# Patient Record
Sex: Male | Born: 1965 | Race: White | Hispanic: No | Marital: Married | Smoking: Former smoker
Health system: Southern US, Community
[De-identification: ages and names within clinical notes are randomized; demographics above are authoritative.]

## PROBLEM LIST (undated history)

## (undated) DIAGNOSIS — F1010 Alcohol abuse, uncomplicated: Secondary | ICD-10-CM

## (undated) DIAGNOSIS — M1990 Unspecified osteoarthritis, unspecified site: Secondary | ICD-10-CM

## (undated) DIAGNOSIS — E785 Hyperlipidemia, unspecified: Secondary | ICD-10-CM

## (undated) DIAGNOSIS — I10 Essential (primary) hypertension: Secondary | ICD-10-CM

## (undated) DIAGNOSIS — IMO0002 Reserved for concepts with insufficient information to code with codable children: Secondary | ICD-10-CM

## (undated) DIAGNOSIS — I2510 Atherosclerotic heart disease of native coronary artery without angina pectoris: Secondary | ICD-10-CM

## (undated) DIAGNOSIS — I219 Acute myocardial infarction, unspecified: Secondary | ICD-10-CM

## (undated) DIAGNOSIS — F329 Major depressive disorder, single episode, unspecified: Secondary | ICD-10-CM

## (undated) DIAGNOSIS — F32A Depression, unspecified: Secondary | ICD-10-CM

## (undated) HISTORY — DX: Hyperlipidemia, unspecified: E78.5

## (undated) HISTORY — PX: CARDIAC CATHETERIZATION: SHX172

## (undated) HISTORY — PX: TONSILLECTOMY: SUR1361

## (undated) HISTORY — DX: Major depressive disorder, single episode, unspecified: F32.9

## (undated) HISTORY — DX: Depression, unspecified: F32.A

---

## 2012-12-23 ENCOUNTER — Inpatient Hospital Stay (HOSPITAL_COMMUNITY)
Admission: EM | Admit: 2012-12-23 | Discharge: 2013-01-04 | DRG: 896 | Disposition: A | Discharge status: Skilled Nursing Facility | Payer: 59 | Attending: Internal Medicine | Admitting: Internal Medicine

## 2012-12-23 ENCOUNTER — Encounter (HOSPITAL_COMMUNITY): Payer: Self-pay

## 2012-12-23 DIAGNOSIS — L219 Seborrheic dermatitis, unspecified: Secondary | ICD-10-CM | POA: Diagnosis present

## 2012-12-23 DIAGNOSIS — E876 Hypokalemia: Secondary | ICD-10-CM | POA: Diagnosis present

## 2012-12-23 DIAGNOSIS — F1010 Alcohol abuse, uncomplicated: Secondary | ICD-10-CM | POA: Diagnosis present

## 2012-12-23 DIAGNOSIS — F10239 Alcohol dependence with withdrawal, unspecified: Secondary | ICD-10-CM | POA: Diagnosis present

## 2012-12-23 DIAGNOSIS — D6959 Other secondary thrombocytopenia: Secondary | ICD-10-CM | POA: Diagnosis present

## 2012-12-23 DIAGNOSIS — W19XXXA Unspecified fall, initial encounter: Secondary | ICD-10-CM

## 2012-12-23 DIAGNOSIS — Z79899 Other long term (current) drug therapy: Secondary | ICD-10-CM

## 2012-12-23 DIAGNOSIS — S99929A Unspecified injury of unspecified foot, initial encounter: Secondary | ICD-10-CM | POA: Diagnosis not present

## 2012-12-23 DIAGNOSIS — G9349 Other encephalopathy: Secondary | ICD-10-CM | POA: Diagnosis present

## 2012-12-23 DIAGNOSIS — F1020 Alcohol dependence, uncomplicated: Secondary | ICD-10-CM | POA: Diagnosis present

## 2012-12-23 DIAGNOSIS — F10231 Alcohol dependence with withdrawal delirium: Principal | ICD-10-CM | POA: Diagnosis present

## 2012-12-23 DIAGNOSIS — I10 Essential (primary) hypertension: Secondary | ICD-10-CM | POA: Diagnosis present

## 2012-12-23 DIAGNOSIS — T65891A Toxic effect of other specified substances, accidental (unintentional), initial encounter: Secondary | ICD-10-CM | POA: Diagnosis present

## 2012-12-23 DIAGNOSIS — R7401 Elevation of levels of liver transaminase levels: Secondary | ICD-10-CM | POA: Diagnosis present

## 2012-12-23 DIAGNOSIS — T510X4A Toxic effect of ethanol, undetermined, initial encounter: Secondary | ICD-10-CM | POA: Diagnosis present

## 2012-12-23 DIAGNOSIS — Y9210 Unspecified residential institution as the place of occurrence of the external cause: Secondary | ICD-10-CM | POA: Diagnosis not present

## 2012-12-23 DIAGNOSIS — S8990XA Unspecified injury of unspecified lower leg, initial encounter: Secondary | ICD-10-CM | POA: Diagnosis not present

## 2012-12-23 DIAGNOSIS — K5900 Constipation, unspecified: Secondary | ICD-10-CM | POA: Diagnosis present

## 2012-12-23 DIAGNOSIS — R7402 Elevation of levels of lactic acid dehydrogenase (LDH): Secondary | ICD-10-CM | POA: Diagnosis present

## 2012-12-23 DIAGNOSIS — Z87891 Personal history of nicotine dependence: Secondary | ICD-10-CM

## 2012-12-23 DIAGNOSIS — I498 Other specified cardiac arrhythmias: Secondary | ICD-10-CM | POA: Diagnosis not present

## 2012-12-23 DIAGNOSIS — W19XXXD Unspecified fall, subsequent encounter: Secondary | ICD-10-CM

## 2012-12-23 DIAGNOSIS — D539 Nutritional anemia, unspecified: Secondary | ICD-10-CM | POA: Diagnosis present

## 2012-12-23 DIAGNOSIS — I959 Hypotension, unspecified: Secondary | ICD-10-CM | POA: Diagnosis not present

## 2012-12-23 DIAGNOSIS — F10931 Alcohol use, unspecified with withdrawal delirium: Principal | ICD-10-CM | POA: Diagnosis present

## 2012-12-23 DIAGNOSIS — F10939 Alcohol use, unspecified with withdrawal, unspecified: Secondary | ICD-10-CM | POA: Diagnosis present

## 2012-12-23 HISTORY — DX: Essential (primary) hypertension: I10

## 2012-12-23 LAB — RAPID URINE DRUG SCREEN, HOSP PERFORMED
Cocaine: NOT DETECTED
Opiates: NOT DETECTED
Tetrahydrocannabinol: NOT DETECTED

## 2012-12-23 LAB — CBC WITH DIFFERENTIAL/PLATELET
Basophils Absolute: 0.1 10*3/uL (ref 0.0–0.1)
Eosinophils Absolute: 0 10*3/uL (ref 0.0–0.7)
Eosinophils Relative: 0 % (ref 0–5)
MCH: 33.6 pg (ref 26.0–34.0)
MCHC: 34.9 g/dL (ref 30.0–36.0)
MCV: 96.3 fL (ref 78.0–100.0)
Platelets: DECREASED 10*3/uL (ref 150–400)
RDW: 12.6 % (ref 11.5–15.5)

## 2012-12-23 LAB — URINALYSIS, ROUTINE W REFLEX MICROSCOPIC
Hgb urine dipstick: NEGATIVE
Nitrite: NEGATIVE
Specific Gravity, Urine: 1.013 (ref 1.005–1.030)
pH: 7 (ref 5.0–8.0)

## 2012-12-23 LAB — COMPREHENSIVE METABOLIC PANEL
ALT: 51 U/L (ref 0–53)
AST: 116 U/L — ABNORMAL HIGH (ref 0–37)
Calcium: 9.2 mg/dL (ref 8.4–10.5)
GFR calc Af Amer: >90 mL/min (ref >90)
Sodium: 137 mEq/L (ref 135–145)
Total Protein: 7.3 g/dL (ref 6.0–8.3)

## 2012-12-23 LAB — MAGNESIUM: Magnesium: 1.3 mg/dL — ABNORMAL LOW (ref 1.5–2.5)

## 2012-12-23 LAB — ETHANOL: Alcohol, Ethyl (B): <11 mg/dL (ref 0–11)

## 2012-12-23 LAB — PROTIME-INR: INR: 1.02 (ref 0.00–1.49)

## 2012-12-23 MED ORDER — SODIUM CHLORIDE 0.9 % IV BOLUS (SEPSIS)
1000.0000 mL | Freq: Once | INTRAVENOUS | Status: AC
  Administered 2012-12-23: 1000 mL via INTRAVENOUS

## 2012-12-23 MED ORDER — ENOXAPARIN SODIUM 30 MG/0.3ML ~~LOC~~ SOLN
30.0000 mg | SUBCUTANEOUS | Status: DC
  Administered 2012-12-23: 30 mg via SUBCUTANEOUS
  Filled 2012-12-23 (×2): qty 0.3

## 2012-12-23 MED ORDER — ASPIRIN 325 MG PO TABS
325.0000 mg | ORAL_TABLET | Freq: Every day | ORAL | Status: DC
  Administered 2012-12-23 – 2013-01-04 (×13): 325 mg via ORAL
  Filled 2012-12-23 (×13): qty 1

## 2012-12-23 MED ORDER — HYDROCODONE-ACETAMINOPHEN 5-325 MG PO TABS
1.0000 | ORAL_TABLET | ORAL | Status: DC | PRN
  Administered 2012-12-28 – 2012-12-30 (×4): 2 via ORAL
  Filled 2012-12-23 (×4): qty 2

## 2012-12-23 MED ORDER — ONDANSETRON HCL 4 MG PO TABS: 4.0000 mg | ORAL_TABLET | Freq: Four times a day (QID) | ORAL | Status: DC | PRN

## 2012-12-23 MED ORDER — HYDRALAZINE HCL 20 MG/ML IJ SOLN
10.0000 mg | Freq: Four times a day (QID) | INTRAMUSCULAR | Status: DC | PRN
  Administered 2012-12-24: 10 mg via INTRAVENOUS
  Filled 2012-12-23: qty 1
  Filled 2012-12-23: qty 0.5

## 2012-12-23 MED ORDER — THIAMINE HCL 100 MG/ML IJ SOLN
100.0000 mg | Freq: Once | INTRAMUSCULAR | Status: AC
  Administered 2012-12-23: 100 mg via INTRAVENOUS
  Filled 2012-12-23: qty 2

## 2012-12-23 MED ORDER — POTASSIUM CHLORIDE 10 MEQ/100ML IV SOLN
10.0000 meq | INTRAVENOUS | Status: AC
  Administered 2012-12-23 – 2012-12-24 (×3): 10 meq via INTRAVENOUS
  Filled 2012-12-23: qty 200
  Filled 2012-12-23: qty 100

## 2012-12-23 MED ORDER — LORAZEPAM 2 MG/ML IJ SOLN: 1.0000 mg | Freq: Once | INTRAMUSCULAR | Status: DC

## 2012-12-23 MED ORDER — LABETALOL HCL 100 MG PO TABS
100.0000 mg | ORAL_TABLET | Freq: Two times a day (BID) | ORAL | Status: DC
  Administered 2012-12-23 – 2012-12-28 (×10): 100 mg via ORAL
  Filled 2012-12-23 (×11): qty 1

## 2012-12-23 MED ORDER — POTASSIUM CHLORIDE CRYS ER 20 MEQ PO TBCR
40.0000 meq | EXTENDED_RELEASE_TABLET | Freq: Three times a day (TID) | ORAL | Status: DC
  Administered 2012-12-23: 40 meq via ORAL
  Filled 2012-12-23 (×3): qty 2

## 2012-12-23 MED ORDER — ONDANSETRON HCL 4 MG/2ML IJ SOLN: 4.0000 mg | Freq: Four times a day (QID) | INTRAMUSCULAR | Status: DC | PRN

## 2012-12-23 MED ORDER — LORAZEPAM 2 MG/ML IJ SOLN
1.0000 mg | Freq: Once | INTRAMUSCULAR | Status: AC
  Administered 2012-12-23: 1 mg via INTRAVENOUS
  Filled 2012-12-23: qty 1

## 2012-12-23 MED ORDER — SODIUM CHLORIDE 0.9 % IV SOLN
INTRAVENOUS | Status: DC
  Administered 2012-12-23 – 2012-12-24 (×2): via INTRAVENOUS
  Administered 2012-12-25: 75 mL/h via INTRAVENOUS

## 2012-12-23 NOTE — ED Notes (Addendum)
Per EMS, Pt, from [facility], presents with ETOH withdrawal and hypertension.  BP 180/120 and HR 130.  Sts last drink was last night.  Sts he drinks every day.   Denies pain.  A & Ox4.       Pt given 10mg  Valium at 1300.

## 2012-12-23 NOTE — ED Notes (Signed)
Potassium of 2.5 reported to EDP, [facility]

## 2012-12-23 NOTE — ED Notes (Signed)
WUJ:WJ19<JY> Expected date:<BR> Expected time:<BR> Means of arrival:Ambulance<BR> Comments:<BR> Elevated BP/HR,etoh withdraw

## 2012-12-23 NOTE — ED Notes (Signed)
Pt informed that we need urine and given a urinal.  Sts he can not provide a sample at this time.

## 2012-12-23 NOTE — H&P (Signed)
[facility] History and Physical  Matthew Costa GNF:[id number] DOB: October 26, 1965 DOA: 12/23/2012  Referring physician: ED physician PCP: Matthew Bristol, MD   Chief Complaint: Alcohol withdrawal   HPI:  Pt is 47 yo male with history of alcohol abuse and [facility] who was sent to [facility] ED for what appeared to be alcohol withdrawal. Pt reports drinking a pint of vodka daily and the last drink was one day prior to this admission. He has started to experience body tremors, shaking, heart palpitations , and generalized malaise. This started several hours prior to admission and has not gotten any better. Pt denies any fevers, chills, any specific neurological symptoms, no chest pain or shortness of breath, no similar events in the past.   In ED, pt continues to have generalized body tremors, electrolyte panel with potassium 2.5, SBP in 170's, and tachycardia in 130's. Pt given total of 2 L NS and KCl 10 meq x 3 runs. TRH asked to admit for further management of alcohol withdrawal. SDU assignment requested.   Assessment and Plan:  Principal Problem:   Alcohol withdrawal - will admit the pt to stepdown unit - close monitoring, place on seizure precaution - CIWA protocol, ativan per protocol - supplement electrolytes, check UDS and alcohol level  - supportive care with IVF, antiemetics as needed Active Problems:   Alcohol abuse - pt will need consultation on cessation as it appears that he is actively drinking - we can provide consultation once more clinically stable to participate    HTN (hypertension), uncontrolled - pt is not on any antihypertensive regimen at home - I will start him on Labetalol 100 mg BID and will add Hydralazine as needed   Hypokalemia - will supplement and will check Mg and phos levels as well - will continue to supplement as indicated    Transaminitis - secondary to alcohol abuse - CMET in AM  Code Status:  Full Family Communication: Pt at bedside Disposition Plan: Admit to SDU   Review of Systems:  Constitutional: Negative for fever, chills. Negative for diaphoresis.  HENT: Negative for hearing loss, ear pain, nosebleeds, congestion, sore throat, neck pain, tinnitus and ear discharge.   Eyes: Negative for blurred vision, double vision, photophobia, pain, discharge and redness.  Respiratory: Negative for cough, hemoptysis, sputum production, shortness of breath, wheezing and stridor.   Cardiovascular: Negative for chest pain, orthopnea, claudication and leg swelling.  Gastrointestinal: Negative for nausea, vomiting and abdominal pain. Negative for heartburn, constipation, blood in stool and melena.  Genitourinary: Negative for dysuria, urgency, frequency, hematuria and flank pain.  Musculoskeletal: Negative for myalgias, back pain, joint pain and falls.  Skin: Negative for itching and rash.  Neurological: Negative for tingling, sensory change, speech change, focal weakness, loss of consciousness and headaches.  Endo/Heme/Allergies: Negative for environmental allergies and polydipsia. Does not bruise/bleed easily.  Psychiatric/Behavioral: Negative for suicidal ideas. The patient is not nervous/anxious.     Past Medical History  Diagnosis Date  . Hypertension     History reviewed. No pertinent past surgical history.  Social History:  reports that he has quit smoking. He does not have any smokeless tobacco history on file. He reports that  drinks alcohol. He reports that he does not use illicit drugs.  No Known Allergies  No known family history of medical problems.   Prior to Admission medications   Medication Sig Start Date End Date Taking? Authorizing Provider  aspirin 325 MG tablet Take 325 mg by  mouth daily.   Yes Historical Provider, MD  Atorvastatin Calcium (LIPITOR PO) Take 1 tablet by mouth daily.   Yes Historical Provider, MD    Physical Exam: Filed Vitals:   12/23/12 1757  12/23/12 1803  BP: 171/122 171/122  Pulse: 114 114  Temp: 99.3 F (37.4 C)   TempSrc: Oral   Resp: 16   SpO2: 99%     Physical Exam  Constitutional: Appears to be in mild distress, anxious  HENT: Normocephalic. External right and left ear normal. Dry MM Eyes: Conjunctivae and EOM are normal. PERRLA, no scleral icterus.  Neck: Normal ROM. Neck supple. No JVD. No tracheal deviation. No thyromegaly.  CVS: Regular rhythm, tachycardic, S1/S2 +, no murmurs, no gallops, no carotid bruit.  Pulmonary: Effort and breath sounds normal, no stridor, rhonchi, wheezes, rales.  Abdominal: Soft. BS +,  no distension, tenderness, rebound or guarding.  Musculoskeletal: Normal range of motion. No edema and no tenderness.  Lymphadenopathy: No lymphadenopathy noted, cervical, inguinal. Neuro: Alert. Normal reflexes, CN II - XII intact, difficulty performing finger to nose and heel to sheen, tremor intentional and at rest  Skin: Skin is warm and dry. No rash noted. Not diaphoretic. No erythema. No pallor.  Psychiatric: Normal mood and affect.  Labs on Admission:  Basic Metabolic Panel:  Recent Labs Lab 12/23/12 1900  NA 137  K 2.5*  CL 92*  CO2 28  GLUCOSE 118*  BUN 4*  CREATININE 0.45*  CALCIUM 9.2   Liver Function Tests:  Recent Labs Lab 12/23/12 1900  AST 116*  ALT 51  ALKPHOS 76  BILITOT 1.8*  PROT 7.3  ALBUMIN 3.5   CBC:  Recent Labs Lab 12/23/12 1900  WBC 4.9  NEUTROABS 3.5  HGB 12.7*  HCT 36.4*  MCV 96.3  PLT PLATELET CLUMPS NOTED ON SMEAR, COUNT APPEARS DECREASED   Radiological Exams on Admission: No results found.  EKG: Normal sinus rhythm, no ST/T wave changes  Debbora Presto, MD  [facility] Pager [phone]  If 7PM-7AM, please contact night-coverage [email or web address] Password [facility] 12/23/2012, 8:37 PM

## 2012-12-23 NOTE — Plan of Care (Signed)
Problem: Phase I Progression Outcomes Goal: Initial discharge plan identified Outcome: Completed/Met Date Met:  12/23/12 Return to [facility] after discharge from hospital

## 2012-12-23 NOTE — ED Provider Notes (Signed)
History     CSN: [id number]  Arrival date & time 12/23/12  1745   First MD Initiated Contact with Patient 12/23/12 1811      Chief Complaint  Patient presents with  . Hypertension  . Delirium Tremens (DTS)    (Consider location/radiation/quality/duration/timing/severity/associated sxs/prior treatment) HPI Pt checked into [facility] program [facility]) and was sent to ED for EtOH withdrawal. States he drinks a pint of vodka daily and last drank 1 day ago. He denies any other drug use. C/o feeling tremulous, heart racing and heaviness in his legs. No CP or SOB. Pt states he has never been through withdrawals in the past.  Past Medical History  Diagnosis Date  . Hypertension     History reviewed. No pertinent past surgical history.  No family history on file.  History  Substance Use Topics  . Smoking status: Former Games developer  . Smokeless tobacco: Not on file  . Alcohol Use: Yes      Review of Systems  Constitutional: Negative for fever and chills.  HENT: Negative for neck pain.   Respiratory: Negative for cough, chest tightness and shortness of breath.   Cardiovascular: Positive for palpitations. Negative for chest pain.  Gastrointestinal: Negative for nausea, vomiting and abdominal pain.  Musculoskeletal: Negative for back pain.  Skin: Negative for rash and wound.  Neurological: Positive for tremors and weakness. Negative for dizziness, seizures, light-headedness and numbness.  All other systems reviewed and are negative.    Allergies  Review of patient's allergies indicates no known allergies.  Home Medications   Current Outpatient Rx  Name  Route  Sig  Dispense  Refill  . aspirin 325 MG tablet   Oral   Take 325 mg by mouth daily.         . Atorvastatin Calcium (LIPITOR PO)   Oral   Take 1 tablet by mouth daily.           BP 171/122  Pulse 114  Temp(Src) 99.3 F (37.4 C) (Oral)  Resp 16  SpO2 99%  Physical Exam  Nursing note and vitals  reviewed. Constitutional: He is oriented to person, place, and time. He appears well-developed and well-nourished. No distress.  HENT:  Head: Normocephalic and atraumatic.  Mouth/Throat: Oropharynx is clear and moist.  Eyes: EOM are normal. Pupils are equal, round, and reactive to light.  +horizontal nystagmus   Neck: Normal range of motion. Neck supple.  Cardiovascular: Regular rhythm.   tachycardia  Pulmonary/Chest: Effort normal and breath sounds normal. No respiratory distress. He has no wheezes. He has no rales.  Abdominal: Soft. Bowel sounds are normal. He exhibits no distension and no mass. There is no tenderness. There is no rebound and no guarding.  Musculoskeletal: Normal range of motion. He exhibits no edema and no tenderness.  Neurological: He is alert and oriented to person, place, and time.  5/5 motor in all ext, sensation intact. Tremulous.   Skin: Skin is warm and dry. No rash noted. No erythema.  Psychiatric: He has a normal mood and affect. His behavior is normal.    ED Course  Procedures (including critical care time)  Labs Reviewed  CBC WITH DIFFERENTIAL - Abnormal; Notable for the following:    RBC 3.78 (*)    Hemoglobin 12.7 (*)    HCT 36.4 (*)    Lymphs Abs 0.6 (*)    Monocytes Relative 16 (*)    All other components within normal limits  COMPREHENSIVE METABOLIC PANEL - Abnormal; Notable for  the following:    Potassium 2.5 (*)    Chloride 92 (*)    Glucose, Bld 118 (*)    BUN 4 (*)    Creatinine, Ser 0.45 (*)    AST 116 (*)    Total Bilirubin 1.8 (*)    All other components within normal limits  ETHANOL  URINALYSIS, ROUTINE W REFLEX MICROSCOPIC  URINE RAPID DRUG SCREEN (HOSP PERFORMED)   No results found.   1. Alcohol withdrawal   2. Hypokalemia      Date: 12/23/2012  Rate:108  Rhythm: sinus tachycardia  QRS Axis: normal  Intervals: normal  ST/T Wave abnormalities: nonspecific T wave changes  Conduction Disutrbances:none  Narrative  Interpretation:   Old EKG Reviewed: none available    MDM   Discussed with Dr Izola Price. Will admit to step down.        Loren Racer, MD 12/23/12 2034

## 2012-12-24 ENCOUNTER — Encounter (HOSPITAL_COMMUNITY): Payer: Self-pay

## 2012-12-24 DIAGNOSIS — F10239 Alcohol dependence with withdrawal, unspecified: Secondary | ICD-10-CM

## 2012-12-24 DIAGNOSIS — I10 Essential (primary) hypertension: Secondary | ICD-10-CM

## 2012-12-24 DIAGNOSIS — F1010 Alcohol abuse, uncomplicated: Secondary | ICD-10-CM

## 2012-12-24 DIAGNOSIS — R740 Nonspecific elevation of levels of transaminase and lactic acid dehydrogenase [LDH]: Secondary | ICD-10-CM

## 2012-12-24 LAB — COMPREHENSIVE METABOLIC PANEL
ALT: 45 U/L (ref 0–53)
AST: 96 U/L — ABNORMAL HIGH (ref 0–37)
Albumin: 3.5 g/dL (ref 3.5–5.2)
CO2: 30 mEq/L (ref 19–32)
Calcium: 9 mg/dL (ref 8.4–10.5)
Creatinine, Ser: 0.56 mg/dL (ref 0.50–1.35)
Sodium: 134 mEq/L — ABNORMAL LOW (ref 135–145)
Total Protein: 7.4 g/dL (ref 6.0–8.3)

## 2012-12-24 LAB — CBC
HCT: 39.2 % (ref 39.0–52.0)
Hemoglobin: 13.7 g/dL (ref 13.0–17.0)
MCH: 34.1 pg — ABNORMAL HIGH (ref 26.0–34.0)
MCHC: 34.9 g/dL (ref 30.0–36.0)
MCV: 97.5 fL (ref 78.0–100.0)
RBC: 4.02 MIL/uL — ABNORMAL LOW (ref 4.22–5.81)

## 2012-12-24 LAB — MAGNESIUM
Magnesium: 1.8 mg/dL (ref 1.5–2.5)
Magnesium: 1.9 mg/dL (ref 1.5–2.5)

## 2012-12-24 LAB — HEMOGLOBIN A1C: Mean Plasma Glucose: 105 mg/dL (ref <117)

## 2012-12-24 LAB — GLUCOSE, CAPILLARY

## 2012-12-24 LAB — MRSA PCR SCREENING: MRSA by PCR: NEGATIVE

## 2012-12-24 LAB — POTASSIUM: Potassium: 3.2 mEq/L — ABNORMAL LOW (ref 3.5–5.1)

## 2012-12-24 MED ORDER — MAGNESIUM OXIDE 400 (241.3 MG) MG PO TABS
800.0000 mg | ORAL_TABLET | Freq: Once | ORAL | Status: AC
  Administered 2012-12-24: 800 mg via ORAL
  Filled 2012-12-24: qty 2

## 2012-12-24 MED ORDER — LORAZEPAM 2 MG/ML IJ SOLN
0.0000 mg | Freq: Four times a day (QID) | INTRAMUSCULAR | Status: AC
  Administered 2012-12-24 (×3): 2 mg via INTRAVENOUS
  Administered 2012-12-25: 1 mg via INTRAVENOUS
  Administered 2012-12-25 (×3): 2 mg via INTRAVENOUS
  Filled 2012-12-24 (×4): qty 1

## 2012-12-24 MED ORDER — POTASSIUM CHLORIDE CRYS ER 20 MEQ PO TBCR
40.0000 meq | EXTENDED_RELEASE_TABLET | Freq: Once | ORAL | Status: AC
  Administered 2012-12-24: 40 meq via ORAL
  Filled 2012-12-24: qty 1

## 2012-12-24 MED ORDER — CHLORDIAZEPOXIDE HCL 10 MG PO CAPS
10.0000 mg | ORAL_CAPSULE | Freq: Three times a day (TID) | ORAL | Status: DC
  Administered 2012-12-24 (×3): 10 mg via ORAL
  Filled 2012-12-24 (×3): qty 1

## 2012-12-24 MED ORDER — ADULT MULTIVITAMIN W/MINERALS CH
1.0000 | ORAL_TABLET | Freq: Every day | ORAL | Status: DC
  Administered 2012-12-24 – 2013-01-04 (×12): 1 via ORAL
  Filled 2012-12-24 (×12): qty 1

## 2012-12-24 MED ORDER — LORAZEPAM 2 MG/ML IJ SOLN: 2.0000 mg | Freq: Once | INTRAMUSCULAR | Status: AC

## 2012-12-24 MED ORDER — POTASSIUM CHLORIDE CRYS ER 20 MEQ PO TBCR
40.0000 meq | EXTENDED_RELEASE_TABLET | Freq: Two times a day (BID) | ORAL | Status: DC
  Administered 2012-12-24: 40 meq via ORAL
  Filled 2012-12-24: qty 2

## 2012-12-24 MED ORDER — MAGNESIUM SULFATE IN D5W 10-5 MG/ML-% IV SOLN
1.0000 g | Freq: Once | INTRAVENOUS | Status: AC
  Administered 2012-12-24: 1 g via INTRAVENOUS
  Filled 2012-12-24: qty 100

## 2012-12-24 MED ORDER — CLONIDINE HCL 0.1 MG PO TABS
0.1000 mg | ORAL_TABLET | Freq: Four times a day (QID) | ORAL | Status: DC | PRN
  Filled 2012-12-24: qty 1

## 2012-12-24 MED ORDER — FOLIC ACID 1 MG PO TABS
1.0000 mg | ORAL_TABLET | Freq: Every day | ORAL | Status: DC
  Administered 2012-12-24 – 2013-01-04 (×12): 1 mg via ORAL
  Filled 2012-12-24 (×12): qty 1

## 2012-12-24 MED ORDER — CLONIDINE HCL 0.1 MG PO TABS
0.1000 mg | ORAL_TABLET | Freq: Two times a day (BID) | ORAL | Status: DC
  Administered 2012-12-24 (×2): 0.1 mg via ORAL
  Filled 2012-12-24 (×4): qty 1

## 2012-12-24 MED ORDER — LORAZEPAM 2 MG/ML IJ SOLN
1.0000 mg | Freq: Four times a day (QID) | INTRAMUSCULAR | Status: DC | PRN
  Administered 2012-12-24 – 2012-12-25 (×2): 1 mg via INTRAVENOUS
  Filled 2012-12-24 (×6): qty 1

## 2012-12-24 MED ORDER — DEXMEDETOMIDINE HCL IN NACL 200 MCG/50ML IV SOLN
0.4000 ug/kg/h | INTRAVENOUS | Status: DC
  Administered 2012-12-24: 1 ug/kg/h via INTRAVENOUS
  Administered 2012-12-25: 0.6 ug/kg/h via INTRAVENOUS
  Filled 2012-12-24 (×3): qty 50

## 2012-12-24 MED ORDER — POTASSIUM CHLORIDE 10 MEQ/100ML IV SOLN
10.0000 meq | INTRAVENOUS | Status: AC
  Administered 2012-12-24 (×6): 10 meq via INTRAVENOUS
  Filled 2012-12-24: qty 600

## 2012-12-24 MED ORDER — THIAMINE HCL 100 MG/ML IJ SOLN
100.0000 mg | Freq: Every day | INTRAMUSCULAR | Status: DC
  Filled 2012-12-24 (×2): qty 1

## 2012-12-24 MED ORDER — LORAZEPAM 2 MG/ML IJ SOLN
0.0000 mg | Freq: Two times a day (BID) | INTRAMUSCULAR | Status: AC
  Administered 2012-12-26: 1 mg via INTRAVENOUS
  Administered 2012-12-26 – 2012-12-27 (×2): 2 mg via INTRAVENOUS
  Filled 2012-12-24 (×3): qty 1

## 2012-12-24 MED ORDER — LORAZEPAM 1 MG PO TABS
1.0000 mg | ORAL_TABLET | Freq: Four times a day (QID) | ORAL | Status: DC | PRN
  Administered 2012-12-24: 1 mg via ORAL
  Filled 2012-12-24: qty 1

## 2012-12-24 MED ORDER — ENOXAPARIN SODIUM 40 MG/0.4ML ~~LOC~~ SOLN
40.0000 mg | SUBCUTANEOUS | Status: DC
  Administered 2012-12-24 – 2012-12-25 (×2): 40 mg via SUBCUTANEOUS
  Filled 2012-12-24 (×3): qty 0.4

## 2012-12-24 MED ORDER — LORAZEPAM 2 MG/ML IJ SOLN
INTRAMUSCULAR | Status: AC
  Administered 2012-12-24: 04:00:00
  Filled 2012-12-24: qty 1

## 2012-12-24 MED ORDER — VITAMIN B-1 100 MG PO TABS
100.0000 mg | ORAL_TABLET | Freq: Every day | ORAL | Status: DC
  Administered 2012-12-24 – 2013-01-04 (×12): 100 mg via ORAL
  Filled 2012-12-24 (×12): qty 1

## 2012-12-24 NOTE — Clinical Social Work Psychosocial (Signed)
Clinical Social Work Department BRIEF PSYCHOSOCIAL ASSESSMENT 12/24/2012  Patient:  Matthew Costa, Matthew Costa     Account Number:  [id number]     Admit date:  12/23/2012  Clinical Social Worker:  Jodelle Red  Date/Time:  12/24/2012 11:30 AM  Referred by:  Physician  Date Referred:  12/23/2012 Referred for  Substance Abuse   Other Referral:   Interview type:  Patient Other interview type:   CHART REVIEW    PSYCHOSOCIAL DATA Living Status:  FACILITY Ad[facility] from [facility]  [facility] Level of care:   Primary support name:  Jeannine Perrault-Randle Primary support relationship to patient:  SPOUSE Degree of support available:   good from wife and extended family    CURRENT CONCERNS Current Concerns  Other - See comment   Other Concerns:   return to rehab    SOCIAL WORK ASSESSMENT / PLAN CSW met with Pt due to admission from rehab, [facility]. Pt has been in rehab several times and is planning to return when stable. Pt shared he is very "embrassed" by his situation. He is eager to start his recovery and has good support to assist him.   Assessment/plan status:  Psychosocial Support/Ongoing Assessment of Needs Other assessment/ plan:   assist with return to [name]   Information/referral to community resources:    PATIENT'S/FAMILY'S RESPONSE TO PLAN OF CARE: Pt eager to return to [facility] and agrees to CSW following and assisting with return once stable. Pt aware and agrees to cSW speaking with [name] to assist.    Doreen Salvage, LCSW ICU/Stepdown Clinical Social Worker [facility] Cell [phone] Hours 8am-1200pm M-F

## 2012-12-24 NOTE — Progress Notes (Signed)
CARE MANAGEMENT NOTE 12/24/2012  Patient:  Matthew Costa, Matthew Costa   Account Number:  [id number]  Date Initiated:  12/24/2012  Documentation initiated by:  Bernetta Sutley  Subjective/Objective Assessment:   pt with hx of etoh abuse, has been at [facility], pending dt's     Action/Plan:   [facility]   Anticipated DC Date:  12/27/2012   Anticipated DC Plan:  IP REHAB FACILITY  In-house referral  Clinical Social Worker      DC Planning Services  NA      [facility] Choice  NA   Choice offered to / List presented to:  NA   DME arranged  NA      DME agency  NA     HH arranged  NA      HH agency  NA   Status of service:  In process, will continue to follow Medicare Important Message given?  NA - LOS <3 / Initial given by admissions (If response is "NO", the following Medicare IM given date fields will be blank) Date Medicare IM given:   Date Additional Medicare IM given:    Discharge Disposition:    Per UR Regulation:  Reviewed for med. necessity/level of care/duration of stay  If discussed at Long Length of Stay Meetings, dates discussed:    Comments:  [id number] Earlene Plater, RN, BSN, CCM:  CHART REVIEWED AND UPDATED.  Next chart review due on [id number]. NO DISCHARGE NEEDS PRESENT AT THIS TIME. CASE MANAGEMENT [phone]

## 2012-12-24 NOTE — Progress Notes (Signed)
K 2.4, mg 1.8. Dr Thedore Mins aware and orders noted.

## 2012-12-24 NOTE — Progress Notes (Signed)
[facility]                                                                                Patient Demographics  Matthew Costa, is a 47 y.o. male, DOB - 1966-07-12, ZOX:[id number], WJX:[id number]  Admit date - 12/23/2012  Admitting Physician Dorothea Ogle, MD  Outpatient Primary MD for the patient is [name], MD  LOS - 1   Chief Complaint  Patient presents with  . Hypertension  . Delirium Tremens (DTS)        Assessment & Plan    Alcohol abuse with Alcohol withdrawal   Counseled the patient quit alcohol, social work will be consulted, continue IV fluids, will add Librium scheduled, continue folate acid and thiamine supplementation, continue when necessary Ativan per CIWA protocol.     HTN (hypertension), uncontrolled  - pt is not on any antihypertensive regimen at home  - Continue labetalol and Catapres which should help with alcohol withdrawals 2.    Hypokalemia  - Replaced IV and oral, will recheck again in the evening along with magnesium levels    Transaminitis  - secondary to alcohol abuse , outpatient GI followup     Code Status: Full  Family Communication: none present  Disposition Plan: Home   Procedures     Consults S work   DVT Prophylaxis  Lovenox   Lab Results  Component Value Date   PLT PENDING 12/24/2012    Medications  Scheduled Meds: . aspirin  325 mg Oral Daily  . chlordiazePOXIDE  10 mg Oral TID  . cloNIDine  0.1 mg Oral BID  . enoxaparin (LOVENOX) injection  40 mg Subcutaneous Q24H  . folic acid  1 mg Oral Daily  . labetalol  100 mg Oral BID  . LORazepam  0-4 mg Intravenous Q6H   Followed by  . [START ON 12/26/2012] LORazepam  0-4 mg Intravenous Q12H  . multivitamin with minerals  1 tablet Oral Daily  . potassium chloride  40 mEq Oral Once  . thiamine  100 mg Oral Daily   Or  . thiamine  100 mg Intravenous Daily   Continuous Infusions: . sodium chloride 75 mL/hr at 12/23/12 2143   PRN  Meds:.hydrALAZINE, HYDROcodone-acetaminophen, LORazepam, LORazepam, ondansetron (ZOFRAN) IV, ondansetron  Antibiotics     Anti-infectives   None       Time Spent in minutes   35   Susa Raring K M.D on 12/24/2012 at 8:47 AM  Between 7am to 7pm - Pager - [phone]  After 7pm go to [email or web address] - password [email or web address]  And look for the night coverage person covering for me after hours  [facility] Office  [phone]    Subjective:   Matthew Costa today has, No headache, No chest pain, No abdominal pain - No Nausea, No new weakness tingling or numbness, No Cough - SOB.   Objective:   Filed Vitals:   12/24/12 0500 12/24/12 0600 12/24/12 0700 12/24/12 0800  BP: 117/86 133/91 140/102 166/107  Pulse: 115 117 111 123  Temp:      TempSrc:      Resp: 18 17 18 17   Height:      Weight:  SpO2: 95% 97% 97% 98%    Wt Readings from Last 3 Encounters:  12/23/12 78.3 kg (172 lb 9.9 oz)     Intake/Output Summary (Last 24 hours) at 12/24/12 0847 Last data filed at 12/24/12 0800  Gross per 24 hour  Intake   1075 ml  Output   3275 ml  Net  -2200 ml    Exam Awake Alert, Oriented X 3, but somewhat anxious, No new F.N deficits,  [place].AT,PERRAL Supple Neck,No JVD, No cervical lymphadenopathy appriciated.  Symmetrical Chest wall movement, Good air movement bilaterally, CTAB RRR,No Gallops,Rubs or new Murmurs, No Parasternal Heave +ve B.Sounds, Abd Soft, Non tender, No organomegaly appriciated, No rebound - guarding or rigidity. No Cyanosis, Clubbing or edema, No new Rash or bruise      Data Review   Micro Results Recent Results (from the past 240 hour(s))  MRSA PCR SCREENING     Status: None   Collection Time    12/23/12 10:19 PM      Result Value Range Status   MRSA by PCR NEGATIVE  NEGATIVE Final   Comment:            The GeneXpert MRSA Assay (FDA     approved for NASAL specimens     only), is one component of a     comprehensive MRSA  colonization     surveillance program. It is not     intended to diagnose MRSA     infection nor to guide or     monitor treatment for     MRSA infections.    Radiology Reports No results found.  CBC  Recent Labs Lab 12/23/12 1900 12/24/12 0754  WBC 4.9 5.7  HGB 12.7* 13.7  HCT 36.4* 39.2  PLT PLATELET CLUMPS NOTED ON SMEAR, COUNT APPEARS DECREASED PENDING  MCV 96.3 97.5  MCH 33.6 34.1*  MCHC 34.9 34.9  RDW 12.6 12.6  LYMPHSABS 0.6*  --   MONOABS 0.8  --   EOSABS 0.0  --   BASOSABS 0.1  --     Chemistries   Recent Labs Lab 12/23/12 1900 12/23/12 2100 12/24/12 0754  NA 137  --  134*  K 2.5*  --  2.4*  CL 92*  --  90*  CO2 28  --  30  GLUCOSE 118*  --  106*  BUN 4*  --  PENDING  CREATININE 0.45*  --  0.56  CALCIUM 9.2  --  9.0  MG  --  1.3* 1.8  AST 116*  --  96*  ALT 51  --  45  ALKPHOS 76  --  77  BILITOT 1.8*  --  2.2*   ------------------------------------------------------------------------------------------------------------------ estimated creatinine clearance is 129.3 ml/min (by C-G formula based on Cr of 0.56). ------------------------------------------------------------------------------------------------------------------  Recent Labs  12/23/12 2100  HGBA1C 5.3   ------------------------------------------------------------------------------------------------------------------ No results found for this basename: CHOL, HDL, LDLCALC, TRIG, CHOLHDL, LDLDIRECT,  in the last 72 hours ------------------------------------------------------------------------------------------------------------------ No results found for this basename: TSH, T4TOTAL, FREET3, T3FREE, THYROIDAB,  in the last 72 hours ------------------------------------------------------------------------------------------------------------------ No results found for this basename: VITAMINB12, FOLATE, FERRITIN, TIBC, IRON, RETICCTPCT,  in the last 72 hours  Coagulation profile  Recent  Labs Lab 12/23/12 2100  INR 1.02    No results found for this basename: DDIMER,  in the last 72 hours  Cardiac Enzymes No results found for this basename: CK, CKMB, TROPONINI, MYOGLOBIN,  in the last 168 hours ------------------------------------------------------------------------------------------------------------------ No components found with this basename: POCBNP,

## 2012-12-25 LAB — BASIC METABOLIC PANEL
BUN: 4 mg/dL — ABNORMAL LOW (ref 6–23)
Chloride: 103 mEq/L (ref 96–112)
GFR calc Af Amer: >90 mL/min (ref >90)
GFR calc non Af Amer: >90 mL/min (ref >90)
Potassium: 3.3 mEq/L — ABNORMAL LOW (ref 3.5–5.1)

## 2012-12-25 LAB — MAGNESIUM: Magnesium: 2.1 mg/dL (ref 1.5–2.5)

## 2012-12-25 MED ORDER — CHLORDIAZEPOXIDE HCL 25 MG PO CAPS
25.0000 mg | ORAL_CAPSULE | Freq: Three times a day (TID) | ORAL | Status: DC
  Administered 2012-12-25 – 2012-12-27 (×8): 25 mg via ORAL
  Filled 2012-12-25 (×8): qty 1

## 2012-12-25 MED ORDER — CLONIDINE HCL 0.2 MG PO TABS
0.2000 mg | ORAL_TABLET | Freq: Three times a day (TID) | ORAL | Status: DC
  Administered 2012-12-25 – 2012-12-28 (×9): 0.2 mg via ORAL
  Filled 2012-12-25 (×11): qty 1

## 2012-12-25 MED ORDER — LORAZEPAM 2 MG/ML IJ SOLN
2.0000 mg | INTRAMUSCULAR | Status: DC | PRN
  Administered 2012-12-25: 2 mg via INTRAVENOUS

## 2012-12-25 MED ORDER — HALOPERIDOL LACTATE 5 MG/ML IJ SOLN
2.0000 mg | Freq: Four times a day (QID) | INTRAMUSCULAR | Status: DC | PRN
  Administered 2012-12-26 – 2012-12-29 (×4): 2 mg via INTRAVENOUS
  Filled 2012-12-25 (×4): qty 1

## 2012-12-25 MED ORDER — POTASSIUM CHLORIDE CRYS ER 20 MEQ PO TBCR
40.0000 meq | EXTENDED_RELEASE_TABLET | Freq: Once | ORAL | Status: AC
  Administered 2012-12-25: 40 meq via ORAL
  Filled 2012-12-25: qty 2

## 2012-12-25 MED ORDER — POTASSIUM CHLORIDE IN NACL 40-0.9 MEQ/L-% IV SOLN
INTRAVENOUS | Status: DC
  Administered 2012-12-25 (×2): 75 mL/h via INTRAVENOUS
  Filled 2012-12-25 (×2): qty 1000

## 2012-12-25 MED ORDER — CLONIDINE HCL 0.2 MG PO TABS
0.2000 mg | ORAL_TABLET | Freq: Two times a day (BID) | ORAL | Status: DC
  Administered 2012-12-25: 0.2 mg via ORAL
  Filled 2012-12-25 (×2): qty 1

## 2012-12-25 NOTE — Progress Notes (Signed)
[facility]                                                                                Patient Demographics  Matthew Costa, is a 47 y.o. male, DOB - 03/22/1966, WUJ:[id number], [id number]:[id number]  Admit date - 12/23/2012  Admitting Physician Dorothea Ogle, MD  Outpatient Primary MD for the patient is [name], MD  LOS - 2   Chief Complaint  Patient presents with  . Hypertension  . Delirium Tremens (DTS)        Assessment & Plan    Alcohol abuse with Alcohol withdrawal   Counseled the patient quit alcohol, social work consulted, continue IV fluids, increased Librium scheduled, continue folate acid and thiamine supplementation, continue when necessary Ativan per CIWA protocol. He did need Precedex drip on 12/24/2012, he looks better and this will be stopped and he will be monitored closely in step-down.     HTN (hypertension), uncontrolled  - pt is not on any antihypertensive regimen at home  - Continue labetalol and Catapres which should help with alcohol withdrawals 2.    Hypokalemia  - Replaced IV and oral, will recheck again in am.    Transaminitis  - secondary to alcohol abuse , outpatient GI followup     Code Status: Full  Family Communication: none present  Disposition Plan: Home   Procedures     Consults S work   DVT Prophylaxis  Lovenox   Lab Results  Component Value Date   PLT 77* 12/24/2012    Medications  Scheduled Meds: . aspirin  325 mg Oral Daily  . chlordiazePOXIDE  25 mg Oral TID  . cloNIDine  0.2 mg Oral BID  . enoxaparin (LOVENOX) injection  40 mg Subcutaneous Q24H  . folic acid  1 mg Oral Daily  . labetalol  100 mg Oral BID  . LORazepam  0-4 mg Intravenous Q6H   Followed by  . [START ON 12/26/2012] LORazepam  0-4 mg Intravenous Q12H  . multivitamin with minerals  1 tablet Oral Daily  . thiamine  100 mg Oral Daily   Or  . thiamine  100 mg Intravenous Daily   Continuous Infusions: . 0.9 % NaCl  with KCl 40 mEq / L 75 mL/hr (12/25/12 0910)   PRN Meds:.hydrALAZINE, HYDROcodone-acetaminophen, LORazepam, LORazepam, ondansetron (ZOFRAN) IV, ondansetron  Antibiotics     Anti-infectives   None       Time Spent in minutes   35   Susa Raring K M.D on 12/25/2012 at 9:28 AM  Between 7am to 7pm - Pager - [phone]  After 7pm go to [email or web address] - password [email or web address]  And look for the night coverage person covering for me after hours  [facility] Office  [phone]    Subjective:   [name] today has, No headache, No chest pain, No abdominal pain - No Nausea, No new weakness tingling or numbness, No Cough - SOB.   Objective:   Filed Vitals:   12/25/12 0600 12/25/12 0700 12/25/12 0800 12/25/12 0900  BP: 133/98 144/89 148/100 140/101  Pulse: 88 89 92 83  Temp:   97 F (36.1 C)   TempSrc:   Axillary  Resp: 18 23 16 16   Height:      Weight:      SpO2: 94% 90% 97% 96%    Wt Readings from Last 3 Encounters:  12/23/12 78.3 kg (172 lb 9.9 oz)     Intake/Output Summary (Last 24 hours) at 12/25/12 0928 Last data filed at 12/25/12 0900  Gross per 24 hour  Intake 2823.3 ml  Output   1700 ml  Net 1123.3 ml    Exam Awake Alert, Oriented X 3,  anxious, No new F.N deficits,  [place].AT,PERRAL Supple Neck,No JVD, No cervical lymphadenopathy appriciated.  Symmetrical Chest wall movement, Good air movement bilaterally, CTAB RRR,No Gallops,Rubs or new Murmurs, No Parasternal Heave +ve B.Sounds, Abd Soft, Non tender, No organomegaly appriciated, No rebound - guarding or rigidity. No Cyanosis, Clubbing or edema, No new Rash or bruise      Data Review   Micro Results Recent Results (from the past 240 hour(s))  MRSA PCR SCREENING     Status: None   Collection Time    12/23/12 10:19 PM      Result Value Range Status   MRSA by PCR NEGATIVE  NEGATIVE Final   Comment:            The GeneXpert MRSA Assay (FDA     approved for NASAL specimens      only), is one component of a     comprehensive MRSA colonization     surveillance program. It is not     intended to diagnose MRSA     infection nor to guide or     monitor treatment for     MRSA infections.    Radiology Reports No results found.  CBC  Recent Labs Lab 12/23/12 1900 12/24/12 0754  WBC 4.9 5.7  HGB 12.7* 13.7  HCT 36.4* 39.2  PLT PLATELET CLUMPS NOTED ON SMEAR, COUNT APPEARS DECREASED 77*  MCV 96.3 97.5  MCH 33.6 34.1*  MCHC 34.9 34.9  RDW 12.6 12.6  LYMPHSABS 0.6*  --   MONOABS 0.8  --   EOSABS 0.0  --   BASOSABS 0.1  --     Chemistries   Recent Labs Lab 12/23/12 1900 12/23/12 2100 12/24/12 0754 12/24/12 1700 12/25/12 0416  NA 137  --  134*  --  143  K 2.5*  --  2.4* 3.2* 3.3*  CL 92*  --  90*  --  103  CO2 28  --  30  --  28  GLUCOSE 118*  --  106*  --  102*  BUN 4*  --  3*  --  4*  CREATININE 0.45*  --  0.56  --  0.55  CALCIUM 9.2  --  9.0  --  9.1  MG  --  1.3* 1.8 1.9 2.1  AST 116*  --  96*  --   --   ALT 51  --  45  --   --   ALKPHOS 76  --  77  --   --   BILITOT 1.8*  --  2.2*  --   --    ------------------------------------------------------------------------------------------------------------------ estimated creatinine clearance is 129.3 ml/min (by C-G formula based on Cr of 0.55). ------------------------------------------------------------------------------------------------------------------  Recent Labs  12/23/12 2100  HGBA1C 5.3   ------------------------------------------------------------------------------------------------------------------ No results found for this basename: CHOL, HDL, LDLCALC, TRIG, CHOLHDL, LDLDIRECT,  in the last 72 hours ------------------------------------------------------------------------------------------------------------------ No results found for this basename: TSH, T4TOTAL, FREET3, T3FREE, THYROIDAB,  in the last 72  hours ------------------------------------------------------------------------------------------------------------------ No  results found for this basename: VITAMINB12, FOLATE, FERRITIN, TIBC, IRON, RETICCTPCT,  in the last 72 hours  Coagulation profile  Recent Labs Lab 12/23/12 2100  INR 1.02    No results found for this basename: DDIMER,  in the last 72 hours  Cardiac Enzymes No results found for this basename: CK, CKMB, TROPONINI, MYOGLOBIN,  in the last 168 hours ------------------------------------------------------------------------------------------------------------------ No components found with this basename: POCBNP,

## 2012-12-25 NOTE — Progress Notes (Signed)
Spoke with Natalia Leatherwood in Admissions at [facility].  Per Natalia Leatherwood, [name] has a bed for Pt when he is medically ready to return.  Providence Crosby, LCSWA Clinical Social Work [phone]

## 2012-12-26 ENCOUNTER — Inpatient Hospital Stay (HOSPITAL_COMMUNITY): Payer: 59

## 2012-12-26 DIAGNOSIS — I10 Essential (primary) hypertension: Secondary | ICD-10-CM

## 2012-12-26 DIAGNOSIS — R740 Nonspecific elevation of levels of transaminase and lactic acid dehydrogenase [LDH]: Secondary | ICD-10-CM

## 2012-12-26 DIAGNOSIS — F1010 Alcohol abuse, uncomplicated: Secondary | ICD-10-CM

## 2012-12-26 DIAGNOSIS — F10239 Alcohol dependence with withdrawal, unspecified: Secondary | ICD-10-CM

## 2012-12-26 LAB — BASIC METABOLIC PANEL
Calcium: 8.9 mg/dL (ref 8.4–10.5)
Creatinine, Ser: 0.62 mg/dL (ref 0.50–1.35)
GFR calc non Af Amer: >90 mL/min (ref >90)
Sodium: 136 mEq/L (ref 135–145)

## 2012-12-26 LAB — URINALYSIS, ROUTINE W REFLEX MICROSCOPIC
Bilirubin Urine: NEGATIVE
Glucose, UA: NEGATIVE mg/dL
Ketones, ur: NEGATIVE mg/dL
Leukocytes, UA: NEGATIVE
Nitrite: NEGATIVE
Protein, ur: NEGATIVE mg/dL
pH: 7.5 (ref 5.0–8.0)

## 2012-12-26 LAB — MAGNESIUM: Magnesium: 1.7 mg/dL (ref 1.5–2.5)

## 2012-12-26 LAB — GLUCOSE, CAPILLARY: Glucose-Capillary: 89 mg/dL (ref 70–99)

## 2012-12-26 MED ORDER — MAGNESIUM SULFATE 40 MG/ML IJ SOLN
2.0000 g | Freq: Once | INTRAMUSCULAR | Status: AC
  Administered 2012-12-26: 2 g via INTRAVENOUS
  Filled 2012-12-26: qty 50

## 2012-12-26 MED ORDER — POTASSIUM CHLORIDE CRYS ER 20 MEQ PO TBCR
40.0000 meq | EXTENDED_RELEASE_TABLET | Freq: Once | ORAL | Status: AC
  Administered 2012-12-26: 40 meq via ORAL
  Filled 2012-12-26: qty 2

## 2012-12-26 MED ORDER — POTASSIUM CHLORIDE IN NACL 40-0.9 MEQ/L-% IV SOLN
INTRAVENOUS | Status: DC
  Administered 2012-12-26: 50 mL/h via INTRAVENOUS
  Administered 2012-12-27: 06:00:00 via INTRAVENOUS
  Filled 2012-12-26 (×3): qty 1000

## 2012-12-26 MED ORDER — LORAZEPAM 2 MG/ML IJ SOLN
1.0000 mg | INTRAMUSCULAR | Status: DC | PRN
  Administered 2012-12-26 – 2012-12-28 (×3): 2 mg via INTRAVENOUS
  Filled 2012-12-26 (×3): qty 1

## 2012-12-26 NOTE — Progress Notes (Signed)
[facility]                                                                                Patient Demographics  Matthew Costa, is a 47 y.o. male, DOB - 1966/03/04, ZOX:[id number], WJX:[id number]  Admit date - 12/23/2012  Admitting Physician Dorothea Ogle, MD  Outpatient Primary MD for the patient is [name], MD  LOS - 3   Chief Complaint  Patient presents with  . Hypertension  . Delirium Tremens (DTS)        Assessment & Plan    Alcohol abuse with Alcohol withdrawal   Counseled the patient quit alcohol, social work consulted, continue IV fluids, increased Librium scheduled, continue folate acid and thiamine supplementation, continue when necessary Ativan per CIWA protocol. He did need Precedex drip on 12/24/2012 which has been stopped, he looks much better today and he feels better to, is oriented x3, will try to increase activity and start tapering his benzodiazepines from tomorrow if he remains stable.     HTN (hypertension), uncontrolled  - pt is not on any antihypertensive regimen at home  - Continue labetalol and Catapres increased which should help with alcohol withdrawals too.    Hypokalemia  - Replaced IV and oral, stable.    Transaminitis  - secondary to alcohol abuse , outpatient GI followup   Thrombocytopenia  Likely due to alcohol abuse, stopped Lovenox and switched to SCDs, outpatient monitoring of platelet count, no active bleeding.      Code Status: Full  Family Communication: none present  Disposition Plan: Home   Procedures     Consults S work   DVT Prophylaxis  SCDs   Lab Results  Component Value Date   PLT 77* 12/24/2012    Medications  Scheduled Meds: . aspirin  325 mg Oral Daily  . chlordiazePOXIDE  25 mg Oral TID  . cloNIDine  0.2 mg Oral TID  . enoxaparin (LOVENOX) injection  40 mg Subcutaneous Q24H  . folic acid  1 mg Oral Daily  . labetalol  100 mg Oral BID  . LORazepam  0-4 mg  Intravenous Q12H  . magnesium sulfate 1 - 4 g bolus IVPB  2 g Intravenous Once  . multivitamin with minerals  1 tablet Oral Daily  . thiamine  100 mg Oral Daily   Continuous Infusions: . 0.9 % NaCl with KCl 40 mEq / L     PRN Meds:.haloperidol lactate, hydrALAZINE, HYDROcodone-acetaminophen, LORazepam, ondansetron (ZOFRAN) IV  Antibiotics     Anti-infectives   None       Time Spent in minutes   35   SINGH,PRASHANT K M.D on 12/26/2012 at 7:09 AM  Between 7am to 7pm - Pager - [phone]  After 7pm go to [email or web address] - password [email or web address]  And look for the night coverage person covering for me after hours  [facility] Office  [phone]    Subjective:   Duanne Duchesne today has, No headache, No chest pain, No abdominal pain - No Nausea, No new weakness tingling or numbness, No Cough - SOB. Says definitely he feels better.  Objective:   Filed Vitals:   12/26/12 0000 12/26/12 0200 12/26/12 0344  12/26/12 0400  BP: 111/73 106/70 121/81 120/88  Pulse: 110 93  103  Temp: 97.5 F (36.4 C)   98.3 F (36.8 C)  TempSrc: Oral   Oral  Resp: 25 23  16   Height:      Weight:      SpO2: 96% 95%  99%    Wt Readings from Last 3 Encounters:  12/23/12 78.3 kg (172 lb 9.9 oz)     Intake/Output Summary (Last 24 hours) at 12/26/12 0709 Last data filed at 12/26/12 0400  Gross per 24 hour  Intake 2377.6 ml  Output   2450 ml  Net  -72.4 ml    Exam Awake Alert, Oriented X 3,  anxious, No new F.N deficits,  [place].AT,PERRAL Supple Neck,No JVD, No cervical lymphadenopathy appriciated.  Symmetrical Chest wall movement, Good air movement bilaterally, CTAB RRR,No Gallops,Rubs or new Murmurs, No Parasternal Heave +ve B.Sounds, Abd Soft, Non tender, No organomegaly appriciated, No rebound - guarding or rigidity. No Cyanosis, Clubbing or edema, No new Rash or bruise      Data Review   Micro Results Recent Results (from the past 240 hour(s))  MRSA PCR SCREENING      Status: None   Collection Time    12/23/12 10:19 PM      Result Value Range Status   MRSA by PCR NEGATIVE  NEGATIVE Final   Comment:            The GeneXpert MRSA Assay (FDA     approved for NASAL specimens     only), is one component of a     comprehensive MRSA colonization     surveillance program. It is not     intended to diagnose MRSA     infection nor to guide or     monitor treatment for     MRSA infections.    Radiology Reports No results found.  CBC  Recent Labs Lab 12/23/12 1900 12/24/12 0754  WBC 4.9 5.7  HGB 12.7* 13.7  HCT 36.4* 39.2  PLT PLATELET CLUMPS NOTED ON SMEAR, COUNT APPEARS DECREASED 77*  MCV 96.3 97.5  MCH 33.6 34.1*  MCHC 34.9 34.9  RDW 12.6 12.6  LYMPHSABS 0.6*  --   MONOABS 0.8  --   EOSABS 0.0  --   BASOSABS 0.1  --     Chemistries   Recent Labs Lab 12/23/12 1900 12/23/12 2100 12/24/12 0754 12/24/12 1700 12/25/12 0416 12/26/12 0340  NA 137  --  134*  --  143 136  K 2.5*  --  2.4* 3.2* 3.3* 3.5  CL 92*  --  90*  --  103 101  CO2 28  --  30  --  28 27  GLUCOSE 118*  --  106*  --  102* 94  BUN 4*  --  3*  --  4* 4*  CREATININE 0.45*  --  0.56  --  0.55 0.62  CALCIUM 9.2  --  9.0  --  9.1 8.9  MG  --  1.3* 1.8 1.9 2.1 1.7  AST 116*  --  96*  --   --   --   ALT 51  --  45  --   --   --   ALKPHOS 76  --  77  --   --   --   BILITOT 1.8*  --  2.2*  --   --   --    ------------------------------------------------------------------------------------------------------------------ estimated creatinine clearance is 129.3 ml/min (by C-G formula  based on Cr of 0.62). ------------------------------------------------------------------------------------------------------------------  Recent Labs  12/23/12 2100  HGBA1C 5.3   ------------------------------------------------------------------------------------------------------------------ No results found for this basename: CHOL, HDL, LDLCALC, TRIG, CHOLHDL, LDLDIRECT,  in the last 72  hours ------------------------------------------------------------------------------------------------------------------ No results found for this basename: TSH, T4TOTAL, FREET3, T3FREE, THYROIDAB,  in the last 72 hours ------------------------------------------------------------------------------------------------------------------ No results found for this basename: VITAMINB12, FOLATE, FERRITIN, TIBC, IRON, RETICCTPCT,  in the last 72 hours  Coagulation profile  Recent Labs Lab 12/23/12 2100  INR 1.02    No results found for this basename: DDIMER,  in the last 72 hours  Cardiac Enzymes No results found for this basename: CK, CKMB, TROPONINI, MYOGLOBIN,  in the last 168 hours ------------------------------------------------------------------------------------------------------------------ No components found with this basename: POCBNP,

## 2012-12-27 ENCOUNTER — Inpatient Hospital Stay (HOSPITAL_COMMUNITY): Payer: 59

## 2012-12-27 LAB — MAGNESIUM: Magnesium: 2.2 mg/dL (ref 1.5–2.5)

## 2012-12-27 LAB — BASIC METABOLIC PANEL
CO2: 28 mEq/L (ref 19–32)
Calcium: 9.2 mg/dL (ref 8.4–10.5)
Creatinine, Ser: 0.65 mg/dL (ref 0.50–1.35)
GFR calc Af Amer: >90 mL/min (ref >90)
Sodium: 138 mEq/L (ref 135–145)

## 2012-12-27 LAB — GLUCOSE, CAPILLARY: Glucose-Capillary: 104 mg/dL — ABNORMAL HIGH (ref 70–99)

## 2012-12-27 MED ORDER — SODIUM CHLORIDE 0.9 % IV SOLN
INTRAVENOUS | Status: DC
  Administered 2012-12-27 – 2012-12-28 (×2): via INTRAVENOUS

## 2012-12-27 MED ORDER — IBUPROFEN 800 MG PO TABS
800.0000 mg | ORAL_TABLET | Freq: Once | ORAL | Status: AC
  Administered 2012-12-27: 800 mg via ORAL
  Filled 2012-12-27: qty 1

## 2012-12-27 MED ORDER — AMLODIPINE BESYLATE 10 MG PO TABS
10.0000 mg | ORAL_TABLET | Freq: Every day | ORAL | Status: DC
  Administered 2012-12-27 – 2012-12-28 (×2): 10 mg via ORAL
  Filled 2012-12-27 (×2): qty 1

## 2012-12-27 MED ORDER — MAGNESIUM SULFATE IN D5W 10-5 MG/ML-% IV SOLN: 1.0000 g | Freq: Once | INTRAVENOUS | Status: DC

## 2012-12-27 MED ORDER — PNEUMOCOCCAL VAC POLYVALENT 25 MCG/0.5ML IJ INJ
0.5000 mL | INJECTION | INTRAMUSCULAR | Status: AC
  Administered 2012-12-28: 0.5 mL via INTRAMUSCULAR
  Filled 2012-12-27 (×2): qty 0.5

## 2012-12-27 MED ORDER — CHLORDIAZEPOXIDE HCL 5 MG PO CAPS
15.0000 mg | ORAL_CAPSULE | Freq: Four times a day (QID) | ORAL | Status: DC
  Administered 2012-12-27 – 2012-12-28 (×4): 15 mg via ORAL
  Filled 2012-12-27 (×4): qty 3

## 2012-12-27 MED ORDER — IBUPROFEN 100 MG PO CHEW
800.0000 mg | CHEWABLE_TABLET | Freq: Once | ORAL | Status: DC
  Filled 2012-12-27: qty 8

## 2012-12-27 NOTE — Progress Notes (Signed)
[facility]                                                                                Patient Demographics  Matthew Costa, is a 47 y.o. male, DOB - [birth year], [id number], [id number]  Admit date - 12/23/2012  Admitting Physician Matthew Ogle, MD  Outpatient Primary MD for the patient is Matthew Bristol, MD  LOS - 4   Chief Complaint  Patient presents with  . Hypertension  . Delirium Tremens (DTS)        Assessment & Plan    Alcohol abuse with Alcohol withdrawal   Counseled the patient quit alcohol, social work consulted, continue IV fluids, increased Librium scheduled, continue folate acid and thiamine supplementation, continue when necessary Ativan per CIWA protocol. He did need Precedex drip on 12/24/2012 which has been stopped, he is mildly disoriented at times but is still oriented x3, will try to increase activity and start tapering his benzodiazepines. He remains hemodynamic he stable although still somewhat confused especially at night times, He will be moved out of step down to regular floor with continued fall and aspiration precautions. When necessary Haldol ordered as needed for severe delirium QTC on EKG is 427 ms on 12/27/2012.    Fall on the night of 12/26/2012 due to mild delirium with some left knee soft tissue injury.  Good range of motion, x-ray unremarkable, will elevate left leg and apply ice packs, one time Motrin      HTN (hypertension), uncontrolled  - pt is not on any antihypertensive regimen at home  - Continue labetalol and Catapres increased which should help with alcohol withdrawals too.    Hypokalemia  - Replaced IV and oral, stable.    Transaminitis  - secondary to alcohol abuse , outpatient GI followup    Thrombocytopenia  Likely due to alcohol abuse, stopped Lovenox and switched to SCDs, outpatient monitoring of platelet count, no active bleeding.      Code Status: Full  Family Communication:  none present  Disposition Plan: Home   Procedures     Consults S work   DVT Prophylaxis  SCDs   Lab Results  Component Value Date   PLT 77* 12/24/2012    Medications  Scheduled Meds: . aspirin  325 mg Oral Daily  . chlordiazePOXIDE  25 mg Oral TID  . cloNIDine  0.2 mg Oral TID  . folic acid  1 mg Oral Daily  . ibuprofen  800 mg Oral Once  . labetalol  100 mg Oral BID  . LORazepam  0-4 mg Intravenous Q12H  . multivitamin with minerals  1 tablet Oral Daily  . thiamine  100 mg Oral Daily   Continuous Infusions: . 0.9 % NaCl with KCl 40 mEq / L 50 mL/hr at 12/27/12 0628   PRN Meds:.haloperidol lactate, hydrALAZINE, HYDROcodone-acetaminophen, LORazepam, ondansetron (ZOFRAN) IV  Antibiotics     Anti-infectives   None       Time Spent in minutes   35   Matthew Costa K M.D on 12/27/2012 at 9:14 AM  Between 7am to 7pm - Pager - [phone]  After 7pm go to [email or web address] - password [email or web address]  And look for the  night coverage person covering for me after hours  [facility] Office  [phone]    Subjective:   [name] today has, No headache, No chest pain, No abdominal pain - No Nausea, No new weakness tingling or numbness, No Cough - SOB. Says definitely he feels better.  Objective:   Filed Vitals:   12/27/12 0000 12/27/12 0400 12/27/12 0600 12/27/12 0800  BP: 130/97 137/98  131/94  Pulse: 92 99  89  Temp: 97.9 F (36.6 C) 98 F (36.7 C)  98.1 F (36.7 C)  TempSrc:    Oral  Resp: 20 19  16   Height:      Weight:   76.9 kg (169 lb 8.5 oz)   SpO2: 97% 98%  99%    Wt Readings from Last 3 Encounters:  12/27/12 76.9 kg (169 lb 8.5 oz)     Intake/Output Summary (Last 24 hours) at 12/27/12 0914 Last data filed at 12/27/12 0800  Gross per 24 hour  Intake 1604.17 ml  Output   3875 ml  Net -2270.83 ml    Exam Awake Alert, Oriented X 3,  anxious, No new F.N deficits,  [place].AT,PERRAL Supple Neck,No JVD, No cervical  lymphadenopathy appriciated.  Symmetrical Chest wall movement, Good air movement bilaterally, CTAB RRR,No Gallops,Rubs or new Murmurs, No Parasternal Heave +ve B.Sounds, Abd Soft, Non tender, No organomegaly appriciated, No rebound - guarding or rigidity. No Cyanosis, Clubbing or edema, No new Rash or bruise , mild left knee tenderness.   Data Review   Micro Results Recent Results (from the past 240 hour(s))  MRSA PCR SCREENING     Status: None   Collection Time    12/23/12 10:19 PM      Result Value Range Status   MRSA by PCR NEGATIVE  NEGATIVE Final   Comment:            The GeneXpert MRSA Assay (FDA     approved for NASAL specimens     only), is one component of a     comprehensive MRSA colonization     surveillance program. It is not     intended to diagnose MRSA     infection nor to guide or     monitor treatment for     MRSA infections.    Radiology Reports No results found.  CBC  Recent Labs Lab 12/23/12 1900 12/24/12 0754  WBC 4.9 5.7  HGB 12.7* 13.7  HCT 36.4* 39.2  PLT PLATELET CLUMPS NOTED ON SMEAR, COUNT APPEARS DECREASED 77*  MCV 96.3 97.5  MCH 33.6 34.1*  MCHC 34.9 34.9  RDW 12.6 12.6  LYMPHSABS 0.6*  --   MONOABS 0.8  --   EOSABS 0.0  --   BASOSABS 0.1  --     Chemistries   Recent Labs Lab 12/23/12 1900  12/24/12 0754 12/24/12 1700 12/25/12 0416 12/26/12 0340 12/27/12 0336  NA 137  --  134*  --  143 136 138  K 2.5*  --  2.4* 3.2* 3.3* 3.5 4.5  CL 92*  --  90*  --  103 101 103  CO2 28  --  30  --  28 27 28   GLUCOSE 118*  --  106*  --  102* 94 100*  BUN 4*  --  3*  --  4* 4* 5*  CREATININE 0.45*  --  0.56  --  0.55 0.62 0.65  CALCIUM 9.2  --  9.0  --  9.1 8.9 9.2  MG  --   < >  1.8 1.9 2.1 1.7 2.2  AST 116*  --  96*  --   --   --   --   ALT 51  --  45  --   --   --   --   ALKPHOS 76  --  77  --   --   --   --   BILITOT 1.8*  --  2.2*  --   --   --   --   < > = values in this interval not  displayed. ------------------------------------------------------------------------------------------------------------------ estimated creatinine clearance is 128.2 ml/min (by C-G formula based on Cr of 0.65). ------------------------------------------------------------------------------------------------------------------ No results found for this basename: HGBA1C,  in the last 72 hours ------------------------------------------------------------------------------------------------------------------ No results found for this basename: CHOL, HDL, LDLCALC, TRIG, CHOLHDL, LDLDIRECT,  in the last 72 hours ------------------------------------------------------------------------------------------------------------------ No results found for this basename: TSH, T4TOTAL, FREET3, T3FREE, THYROIDAB,  in the last 72 hours ------------------------------------------------------------------------------------------------------------------ No results found for this basename: VITAMINB12, FOLATE, FERRITIN, TIBC, IRON, RETICCTPCT,  in the last 72 hours  Coagulation profile  Recent Labs Lab 12/23/12 2100  INR 1.02    No results found for this basename: DDIMER,  in the last 72 hours  Cardiac Enzymes No results found for this basename: CK, CKMB, TROPONINI, MYOGLOBIN,  in the last 168 hours ------------------------------------------------------------------------------------------------------------------ No components found with this basename: POCBNP,

## 2012-12-27 NOTE — Clinical Social Work Note (Addendum)
CSW following for return to [facility]. Pt has a bed at [facility] for when medically stable. CSW will continue to assist and help with return to [facility]. CSW updated Pt as well and he is eager to return to [facility] to complete his inpt. Treatment there. Pt has a pending court date June 2 and wants to complete rehab prior. CSW provided support.   Doreen Salvage, LCSW ICU/Stepdown Clinical Social Worker [facility] Cell [phone] Hours 8am-1200pm M-F

## 2012-12-28 LAB — CBC
MCH: 33.8 pg (ref 26.0–34.0)
MCH: 33.4 pg (ref 26.0–34.0)
MCHC: 33.4 g/dL (ref 30.0–36.0)
MCV: 101 fL — ABNORMAL HIGH (ref 78.0–100.0)
MCV: 101.4 fL — ABNORMAL HIGH (ref 78.0–100.0)
Platelets: 160 10*3/uL (ref 150–400)
Platelets: 183 10*3/uL (ref 150–400)
RDW: 12.8 % (ref 11.5–15.5)
RDW: 12.7 % (ref 11.5–15.5)
WBC: 6.2 10*3/uL (ref 4.0–10.5)

## 2012-12-28 LAB — URINE CULTURE: Colony Count: 45000

## 2012-12-28 LAB — BASIC METABOLIC PANEL
BUN: 8 mg/dL (ref 6–23)
Calcium: 9.8 mg/dL (ref 8.4–10.5)
Creatinine, Ser: 0.72 mg/dL (ref 0.50–1.35)
GFR calc Af Amer: >90 mL/min (ref >90)
GFR calc non Af Amer: >90 mL/min (ref >90)
Glucose, Bld: 108 mg/dL — ABNORMAL HIGH (ref 70–99)

## 2012-12-28 LAB — GLUCOSE, CAPILLARY: Glucose-Capillary: 103 mg/dL — ABNORMAL HIGH (ref 70–99)

## 2012-12-28 LAB — LACTIC ACID, PLASMA: Lactic Acid, Venous: 1.9 mmol/L (ref 0.5–2.2)

## 2012-12-28 MED ORDER — SODIUM CHLORIDE 0.9 % IV SOLN
INTRAVENOUS | Status: AC
  Administered 2012-12-28 (×2): via INTRAVENOUS
  Administered 2012-12-29: 100 mL/h via INTRAVENOUS

## 2012-12-28 MED ORDER — SODIUM CHLORIDE 0.9 % IV SOLN
Freq: Once | INTRAVENOUS | Status: AC
  Administered 2012-12-28: 500 mL/h via INTRAVENOUS

## 2012-12-28 MED ORDER — LABETALOL HCL 100 MG PO TABS: 100.0000 mg | ORAL_TABLET | Freq: Two times a day (BID) | ORAL | Status: DC

## 2012-12-28 MED ORDER — SODIUM CHLORIDE 0.9 % IV BOLUS (SEPSIS)
500.0000 mL | INTRAVENOUS | Status: AC
  Administered 2012-12-28: 500 mL via INTRAVENOUS

## 2012-12-28 MED ORDER — LORAZEPAM 2 MG/ML IJ SOLN
1.0000 mg | INTRAMUSCULAR | Status: DC | PRN
  Administered 2012-12-28 – 2013-01-01 (×13): 1 mg via INTRAVENOUS
  Filled 2012-12-28 (×14): qty 1

## 2012-12-28 MED ORDER — SODIUM CHLORIDE 0.9 % IV BOLUS (SEPSIS)
1000.0000 mL | Freq: Once | INTRAVENOUS | Status: AC
  Administered 2012-12-28: 1000 mL via INTRAVENOUS

## 2012-12-28 MED ORDER — CLONIDINE HCL 0.1 MG PO TABS
0.1000 mg | ORAL_TABLET | Freq: Two times a day (BID) | ORAL | Status: DC
  Filled 2012-12-28: qty 1

## 2012-12-28 MED ORDER — CLONIDINE HCL 0.1 MG PO TABS
0.1000 mg | ORAL_TABLET | Freq: Three times a day (TID) | ORAL | Status: DC | PRN
  Filled 2012-12-28: qty 1

## 2012-12-28 MED ORDER — CHLORDIAZEPOXIDE HCL 5 MG PO CAPS
15.0000 mg | ORAL_CAPSULE | Freq: Three times a day (TID) | ORAL | Status: DC
  Administered 2012-12-29 – 2012-12-30 (×5): 15 mg via ORAL
  Filled 2012-12-28 (×2): qty 3
  Filled 2012-12-28: qty 2
  Filled 2012-12-28 (×2): qty 3
  Filled 2012-12-28: qty 1

## 2012-12-28 NOTE — Evaluation (Signed)
Occupational Therapy Evaluation Patient Details Name: Matthew Costa MRN: [id number] DOB: 11-16-1965 Today's Date: 12/28/2012 Time: 1200-1229 OT Time Calculation (min): 29 min  OT Assessment / Plan / Recommendation Clinical Impression  Pt is a 47 yo male admitted for detoxification from alcohol who presents with the deficits listed below.  Pt would benefit from cont OT to increase safety and I with basic adls as well as increase cognitive awareness of his deficits.    OT Assessment  Patient needs continued OT Services    Follow Up Recommendations  SNF;CIR;Supervision/Assistance - 24 hour (depends on how quickly the DTs stop)    Barriers to Discharge Other (comment) Pt is supposed to return to [facility]  but will require some physical rehab before doing so.  At this time, pt is unable to walk w/o significant assist from others.  Equipment Recommendations  3 in 1 bedside comode    Recommendations for Other Services PT consult  Frequency  Min 2X/week    Precautions / Restrictions Precautions Precautions: Fall Precaution Comments: Pt very high fall risk due to instability and decreased awareness/safety and judgement. Restrictions Weight Bearing Restrictions: No   Pertinent Vitals/Pain Pt unable to tell me if he was having any pain.    ADL  Eating/Feeding: Performed;Minimal assistance Where Assessed - Eating/Feeding: Chair Grooming: Performed;Teeth care;Minimal assistance Where Assessed - Grooming: Supported standing Upper Body Bathing: Simulated;Minimal assistance Where Assessed - Upper Body Bathing: Unsupported sitting Lower Body Bathing: Simulated;Moderate assistance Where Assessed - Lower Body Bathing: Supported sit to stand Upper Body Dressing: Performed;Moderate assistance Where Assessed - Upper Body Dressing: Unsupported sitting Lower Body Dressing: Performed;Moderate assistance Where Assessed - Lower Body Dressing: Supported sit to Pharmacist, hospital:  Performed;Maximal assistance Toilet Transfer Method: Other (comment) (ambulated to BR with HHA.) Toilet Transfer Equipment: Raised toilet seat with arms (or 3-in-1 over toilet) Toileting - Clothing Manipulation and Hygiene: Performed;+1 Total assistance Where Assessed - Toileting Clothing Manipulation and Hygiene: Standing Transfers/Ambulation Related to ADLs: Pt extremely unsteady with ambulation.  Pt also varies from moment to moment with how steady he is on his feet.  AFter walking 2-3 feet with min guard, pt became very shaky with shuffling gait and required 2person assist to make it to the chair that was steps away. ADL Comments: Pt has a tremor in B UEs at this point tha makes some grooming and feeding difficult.  Decreased balance standing is making all adls in standing very difficult.    OT Diagnosis: Generalized weakness;Cognitive deficits  OT Problem List: Decreased strength;Decreased activity tolerance;Impaired balance (sitting and/or standing);Decreased coordination;Decreased cognition;Decreased safety awareness;Decreased knowledge of use of DME or AE;Decreased knowledge of precautions OT Treatment Interventions: Self-care/ADL training;Therapeutic activities;Cognitive remediation/compensation   OT Goals Acute Rehab OT Goals OT Goal Formulation: Patient unable to participate in goal setting Time For Goal Achievement: 01/11/13 Potential to Achieve Goals: Fair ADL Goals Pt Will Perform Eating: Independently;Sitting, chair ADL Goal: Eating - Progress: Goal set today Pt Will Perform Grooming: with supervision;Standing at sink ADL Goal: Grooming - Progress: Goal set today Pt Will Perform Lower Body Bathing: with modified independence;Sit to stand from chair;Sit to stand in shower ADL Goal: Lower Body Bathing - Progress: Goal set today Pt Will Perform Lower Body Dressing: with supervision;Sit to stand from chair ADL Goal: Lower Body Dressing - Progress: Goal set today Pt Will Perform  Tub/Shower Transfer: with supervision ADL Goal: Tub/Shower Transfer - Progress: Goal set today Additional ADL Goal #1: Pt will toilet on comfort height commode with rails  and S. ADL Goal: Additional Goal #1 - Progress: Goal set today Miscellaneous OT Goals Miscellaneous OT Goal #1: Pt will answer orientation/safety questions with 100% accuracy. OT Goal: Miscellaneous Goal #1 - Progress: Goal set today  Visit Information  Last OT Received On: 12/28/12 Assistance Needed: +2    Subjective Data  Subjective: "I am an invalid." Patient Stated Goal: none stated.   Prior Functioning     Home Living Lives With: Spouse;Family;Other (Comment) (2 children ages 2 and 4 (per pt)) Available Help at Discharge: Family;Other (Comment) (not sure if available 24/7) Type of Home: House Home Access: Stairs to enter [employer] of Steps: 6 Entrance Stairs-Rails: Left;Right Home Layout: Two level;Able to live on main level with bedroom/bathroom Alternate Level Stairs-Number of Steps: 14 Bathroom Shower/Tub: Walk-in shower;Door Bathroom Toilet: Standard Home Adaptive Equipment: None Additional Comments: Not sure of validity of pts history.  Pt with memory issues throughout session. Prior Function Level of Independence: Independent Able to Take Stairs?: Yes Driving: Yes Vocation: Other (comment) (early retirement from [employer] per pt.) Comments: Pt states he worked for [employer] and took early retirement.  Pt was in [facility] when DTs started. Communication Communication: No difficulties Dominant Hand: Right         Vision/Perception Vision - History Baseline Vision: No visual deficits Patient Visual Report: No change from baseline Vision - Assessment Vision Assessment: Vision not tested   Cognition  Cognition Arousal/Alertness: Awake/alert Behavior During Therapy: Anxious Overall Cognitive Status: Impaired/Different from baseline Area of  Impairment: Orientation;Attention;Memory;Following commands;Safety/judgement;Awareness;Problem solving Orientation Level: Place;Time (stated it was March and thought he was in [place].) Current Attention Level: Sustained Memory: Decreased recall of precautions Following Commands: Follows one step commands inconsistently Safety/Judgement: Decreased awareness of safety;Decreased awareness of deficits Problem Solving: Slow processing;Difficulty sequencing;Requires verbal cues;Requires tactile cues General Comments: Pt with R/L discrimination issues when given verbal cues and very anxoius, not following all commands.    Extremity/Trunk Assessment Right Upper Extremity Assessment RUE ROM/Strength/Tone: Within functional levels RUE Sensation: WFL - Light Touch RUE Coordination: Deficits RUE Coordination Deficits: pt with tremor that makes pt drop items and have difficulty with fine motor tasks. Left Upper Extremity Assessment LUE ROM/Strength/Tone: Within functional levels LUE Sensation: WFL - Light Touch LUE Coordination: Deficits LUE Coordination Deficits: Pt with tremor that affects fine motor tasks. Trunk Assessment Trunk Assessment: Normal     Mobility Bed Mobility Bed Mobility: Supine to Sit Supine to Sit: 5: Supervision Details for Bed Mobility Assistance: Pt climbing over bed rails to get out of bed and trying to go in between bedrails to get out of bed. Transfers Transfers: Sit to Stand;Stand to Sit Sit to Stand: 4: Min guard;With armrests;From bed Stand to Sit: 3: Mod assist;With armrests;To chair/3-in-1 Details for Transfer Assistance: Pt stood with just min guard but as soon as pt is asked to take more than one or two steps, he becomes very anxious and unsteady and requires plus 2 assist to walker.     Exercise     Balance Balance Balance Assessed: Yes Static Standing Balance Static Standing - Balance Support: Bilateral upper extremity supported;During functional  activity Static Standing - Level of Assistance: 3: Mod assist Static Standing - Comment/# of Minutes: 3   End of Session OT - End of Session Activity Tolerance: Patient limited by fatigue;Treatment limited secondary to agitation Patient left: in chair;with nursing in room;with call bell/phone within reach Nurse Communication: Mobility status;Other (comment) (need for chair alarm in room (  none avail))  GO     Hope Budds 12/28/2012, 12:58 PM [phone]

## 2012-12-28 NOTE — Progress Notes (Addendum)
[facility]                                                                                Patient Demographics  Matthew Costa, is a 47 y.o. male, DOB - 07-18-1966, WUJ:[id number], NFA:[id number]  Admit date - 12/23/2012  Admitting Physician Dorothea Ogle, MD  Outpatient Primary MD for the patient is [name], MD  LOS - 5   Chief Complaint  Patient presents with  . Hypertension  . Delirium Tremens (DTS)        Assessment & Plan   Brief summary  This is a 47 year old Caucasian male with past medical history of severe alcohol abuse, hypertension, was admitted after he wished to be detoxed, he had severe DTs requiring Precedex drip for a day, he still on scheduled Librium, as needed Ativan per CIWA protocol along with when necessary Haldol. He is gradually getting better, PT is following the patient, he has been moved from step down to regular telemetry bed, he will need another one to 2 days of monitoring before he could be discharged. Of note he had a trip and fall in step down unit injuring his left knee, this was soft tissue injury only, x-rays have been stable. Social work and PT is following.     Assessment and plan     Alcohol abuse with Alcohol withdrawal   Counseled the patient quit alcohol, social work consulted, continue IV fluids, increased Librium scheduled, continue folate acid and thiamine supplementation, continue when necessary Ativan per CIWA protocol. He did need Precedex drip on 12/24/2012 which has been stopped, he is mildly disoriented at times but is still oriented x3, will try to increase activity and start tapering his scheduled Librium. He remains hemodynamic he stable although still somewhat confused especially at night times, He will be moved out of step down to regular floor with continued fall and aspiration precautions. When necessary Haldol ordered as needed for severe delirium QTC on EKG is 427 ms on 12/27/2012.    Fall on  the night of 12/26/2012 due to mild delirium with some left knee soft tissue injury.  Good range of motion, x-ray unremarkable, will elevate left leg and apply ice packs, one time Motrin      HTN (hypertension), uncontrolled   Continue labetalol , Norvasc along with Catapres, as needed hydralazine.    Hypokalemia  - Replaced IV and oral, stable.    Transaminitis  - secondary to alcohol abuse , outpatient GI followup    Thrombocytopenia  Likely due to alcohol abuse, stopped Lovenox and switched to SCDs, outpatient monitoring of platelet count, no active bleeding.   Addendum. Around 4 PM patient has become hypotensive all of a sudden, I have held all his blood pressure medications for today, discontinue Norvasc, made Catapres to when necessary only, skipped 2 nights labetalol dose, 1-1/2 L IV fluid bolus to be given, repeat stat H&H, Will check lactic acid also along with a BMP, he remains afebrile and completely asymptomatic I have also, reduced his scheduled Librium and when necessary Ativan dose. He will be monitored closely on telemetry close to nursing unit.     Code Status: Full  Family  Communication: none present  Disposition Plan: Home   Procedures  left knee x-ray, chest x-ray   Consults S work, PT   DVT Prophylaxis  SCDs   Lab Results  Component Value Date   PLT 160 12/28/2012    Medications  Scheduled Meds: . amLODipine  10 mg Oral Daily  . aspirin  325 mg Oral Daily  . chlordiazePOXIDE  15 mg Oral QID  . cloNIDine  0.2 mg Oral TID  . folic acid  1 mg Oral Daily  . labetalol  100 mg Oral BID  . multivitamin with minerals  1 tablet Oral Daily  . thiamine  100 mg Oral Daily   Continuous Infusions: . sodium chloride 50 mL/hr at 12/28/12 0014   PRN Meds:.haloperidol lactate, hydrALAZINE, HYDROcodone-acetaminophen, LORazepam, ondansetron (ZOFRAN) IV  Antibiotics     Anti-infectives   None       Time Spent in minutes    35   Susa Raring K M.D on 12/28/2012 at 11:48 AM  Between 7am to 7pm - Pager - [phone]  After 7pm go to [email or web address] - password [email or web address]  And look for the night coverage person covering for me after hours  [facility] Office  [phone]    Subjective:   Matthew Costa today has, No headache, No chest pain, No abdominal pain - No Nausea, No new weakness tingling or numbness, No Cough - SOB. Says definitely he feels better. Mild left knee discomfort.  Objective:   Filed Vitals:   12/27/12 0800 12/27/12 1000 12/27/12 1530 12/27/12 2127  BP: 131/94 151/106 112/81 166/90  Pulse: 89 96 88 98  Temp: 98.1 F (36.7 C)  97.7 F (36.5 C) 97.8 F (36.6 C)  TempSrc: Oral  Oral Oral  Resp: 16 14 18 18   Height:      Weight:      SpO2: 99% 99% 99% 99%    Wt Readings from Last 3 Encounters:  12/27/12 76.9 kg (169 lb 8.5 oz)     Intake/Output Summary (Last 24 hours) at 12/28/12 1148 Last data filed at 12/28/12 0908  Gross per 24 hour  Intake 1666.67 ml  Output   2150 ml  Net -483.33 ml    Exam Awake Alert, Oriented X 2,  anxious, No new F.N deficits,  [place].AT,PERRAL Supple Neck,No JVD, No cervical lymphadenopathy appriciated.  Symmetrical Chest wall movement, Good air movement bilaterally, CTAB RRR,No Gallops,Rubs or new Murmurs, No Parasternal Heave +ve B.Sounds, Abd Soft, Non tender, No organomegaly appriciated, No rebound - guarding or rigidity. No Cyanosis, Clubbing or edema, No new Rash or bruise , mild left knee tenderness.   Data Review   Micro Results Recent Results (from the past 240 hour(s))  MRSA PCR SCREENING     Status: None   Collection Time    12/23/12 10:19 PM      Result Value Range Status   MRSA by PCR NEGATIVE  NEGATIVE Final   Comment:            The GeneXpert MRSA Assay (FDA     approved for NASAL specimens     only), is one component of a     comprehensive MRSA colonization     surveillance program. It is not      intended to diagnose MRSA     infection nor to guide or     monitor treatment for     MRSA infections.  URINE CULTURE     Status: None   Collection Time  12/26/12  6:40 AM      Result Value Range Status   Specimen Description URINE, CLEAN CATCH   Final   Special Requests NONE   Final   Culture  Setup Time 12/26/2012 11:50   Final   Colony Count 45,000 COLONIES/ML   Final   Culture     Final   Value: Multiple bacterial morphotypes present, none predominant. Suggest appropriate recollection if clinically indicated.   Report Status 12/28/2012 FINAL   Final    Radiology Reports No results found.  CBC  Recent Labs Lab 12/23/12 1900 12/24/12 0754 12/28/12 0520  WBC 4.9 5.7 5.5  HGB 12.7* 13.7 13.1  HCT 36.4* 39.2 39.2  PLT PLATELET CLUMPS NOTED ON SMEAR, COUNT APPEARS DECREASED 77* 160  MCV 96.3 97.5 101.0*  MCH 33.6 34.1* 33.8  MCHC 34.9 34.9 33.4  RDW 12.6 12.6 12.8  LYMPHSABS 0.6*  --   --   MONOABS 0.8  --   --   EOSABS 0.0  --   --   BASOSABS 0.1  --   --     Chemistries   Recent Labs Lab 12/23/12 1900  12/24/12 0754 12/24/12 1700 12/25/12 0416 12/26/12 0340 12/27/12 0336 12/28/12 0520  NA 137  --  134*  --  143 136 138 138  K 2.5*  --  2.4* 3.2* 3.3* 3.5 4.5 4.4  CL 92*  --  90*  --  103 101 103 102  CO2 28  --  30  --  28 27 28 28   GLUCOSE 118*  --  106*  --  102* 94 100* 108*  BUN 4*  --  3*  --  4* 4* 5* 8  CREATININE 0.45*  --  0.56  --  0.55 0.62 0.65 0.72  CALCIUM 9.2  --  9.0  --  9.1 8.9 9.2 9.8  MG  --   < > 1.8 1.9 2.1 1.7 2.2 2.1  AST 116*  --  96*  --   --   --   --   --   ALT 51  --  45  --   --   --   --   --   ALKPHOS 76  --  77  --   --   --   --   --   BILITOT 1.8*  --  2.2*  --   --   --   --   --   < > = values in this interval not displayed. ------------------------------------------------------------------------------------------------------------------ estimated creatinine clearance is 128.2 ml/min (by C-G formula based on Cr  of 0.72). ------------------------------------------------------------------------------------------------------------------ No results found for this basename: HGBA1C,  in the last 72 hours ------------------------------------------------------------------------------------------------------------------ No results found for this basename: CHOL, HDL, LDLCALC, TRIG, CHOLHDL, LDLDIRECT,  in the last 72 hours ------------------------------------------------------------------------------------------------------------------ No results found for this basename: TSH, T4TOTAL, FREET3, T3FREE, THYROIDAB,  in the last 72 hours ------------------------------------------------------------------------------------------------------------------ No results found for this basename: VITAMINB12, FOLATE, FERRITIN, TIBC, IRON, RETICCTPCT,  in the last 72 hours  Coagulation profile  Recent Labs Lab 12/23/12 2100  INR 1.02    No results found for this basename: DDIMER,  in the last 72 hours  Cardiac Enzymes No results found for this basename: CK, CKMB, TROPONINI, MYOGLOBIN,  in the last 168 hours ------------------------------------------------------------------------------------------------------------------ No components found with this basename: POCBNP,

## 2012-12-28 NOTE — Progress Notes (Signed)
Visit with patient. He talked about the death of his mother-in-law in 07/24/23 and how on the way back from her funeral, he was transported to the hospital for heart problems. His hospital stays made him aware of his own mortality and this frightened him. He told me that he drinks when life gets too hard for him. Fortunately, he and his wife have been talking to their pastor who encouraged him to get into a program. Offered support; compassionate presence; listening. Will follow up for support with coping and resilience.

## 2012-12-29 DIAGNOSIS — W19XXXA Unspecified fall, initial encounter: Secondary | ICD-10-CM

## 2012-12-29 LAB — GLUCOSE, CAPILLARY

## 2012-12-29 LAB — BASIC METABOLIC PANEL
Calcium: 9.1 mg/dL (ref 8.4–10.5)
GFR calc Af Amer: >90 mL/min (ref >90)
GFR calc non Af Amer: >90 mL/min (ref >90)
Glucose, Bld: 133 mg/dL — ABNORMAL HIGH (ref 70–99)
Sodium: 139 mEq/L (ref 135–145)

## 2012-12-29 MED ORDER — NICOTINE 14 MG/24HR TD PT24
14.0000 mg | MEDICATED_PATCH | Freq: Every day | TRANSDERMAL | Status: DC
  Administered 2012-12-29 – 2013-01-04 (×7): 14 mg via TRANSDERMAL
  Filled 2012-12-29 (×7): qty 1

## 2012-12-29 NOTE — Evaluation (Signed)
Physical Therapy Evaluation Patient Details Name: Matthew Costa MRN: [id number] DOB: [birth year] Today's Date: 12/29/2012 Time: 5621-3086 PT Time Calculation (min): 17 min  PT Assessment / Plan / Recommendation Clinical Impression  Pt is a 47 year old male admitted for alcohol withdrawal.  Pt would benefit from acute PT services in order to improve independence with transfers and ambulation as well as increase safety awareness.  Pt very confused today asking to go to living room, trying to get from toilet without assist.  Due to confusion and unsteadiness, pt is a high fall risk.    PT Assessment  Patient needs continued PT services    Follow Up Recommendations  SNF;Supervision/Assistance - 24 hour    Does the patient have the potential to tolerate intense rehabilitation      Barriers to Discharge        Equipment Recommendations  Rolling walker with 5" wheels    Recommendations for Other Services     Frequency Min 3X/week    Precautions / Restrictions Precautions Precautions: Fall Precaution Comments: Pt very high fall risk due to instability and decreased awareness/safety and judgement.   Pertinent Vitals/Pain n/a      Mobility  Bed Mobility Bed Mobility: Supine to Sit Supine to Sit: 5: Supervision Details for Bed Mobility Assistance: Pt attempting to scoot past bed rail to get OOB Transfers Transfers: Sit to Stand;Stand to Sit Sit to Stand: 4: Min assist;With upper extremity assist;From toilet;From bed Stand to Sit: 4: Min assist;With upper extremity assist;To toilet;To chair/3-in-1 Details for Transfer Assistance: assist to steady and control descent, multiple verbal cues for safety Ambulation/Gait Ambulation/Gait Assistance: 3: Mod assist Ambulation Distance (Feet): 50 Feet (total) Assistive device: Rolling walker Ambulation/Gait Assistance Details: pt ambulated to bathroom with 2 HHA and then into hallway with RW, requires increased verbal cues for safety,  staying close to RW, and assist negotiating RW Gait Pattern: Step-through pattern;Decreased stride length;Narrow base of support    Exercises     PT Diagnosis: Difficulty walking  PT Problem List: Decreased strength;Decreased activity tolerance;Decreased balance;Decreased mobility;Decreased safety awareness;Decreased knowledge of use of DME;Decreased cognition PT Treatment Interventions: Gait training;DME instruction;Neuromuscular re-education;Balance training;Patient/family education;Functional mobility training;Therapeutic activities;Therapeutic exercise   PT Goals Acute Rehab PT Goals PT Goal Formulation: Patient unable to participate in goal setting Time For Goal Achievement: 01/12/13 Potential to Achieve Goals: Good Pt will go Sit to Stand: with modified independence PT Goal: Sit to Stand - Progress: Goal set today Pt will go Stand to Sit: with modified independence PT Goal: Stand to Sit - Progress: Goal set today Pt will Ambulate: 1 - 15 feet;51 - 150 feet;with supervision;with least restrictive assistive device PT Goal: Ambulate - Progress: Goal set today Pt will Perform Home Exercise Program: with supervision, verbal cues required/provided PT Goal: Perform Home Exercise Program - Progress: Goal set today  Visit Information  Last PT Received On: 12/29/12 Assistance Needed: +2    Subjective Data  Subjective: Let's go to the living room. (confused today)   Prior Functioning  Home Living Lives With: Spouse;Family;Other (Comment) (2 children ages 2 and 4 (per pt)) Available Help at Discharge: Family;Other (Comment) (not sure if available 24/7) Type of Home: House Home Access: Stairs to enter [employer] of Steps: 6 Entrance Stairs-Rails: Left;Right Home Layout: Two level;Able to live on main level with bedroom/bathroom Alternate Level Stairs-Number of Steps: 14 Bathroom Shower/Tub: Walk-in shower;Door Bathroom Toilet: Standard Home Adaptive Equipment:  None Additional Comments: Not sure of validity of pts history.  Pt  with memory issues throughout session. (per OT note) pt confused this visit so did not ask  Prior Function Level of Independence: Independent Able to Take Stairs?: Yes Driving: Yes Vocation: Other (comment) (early retirement from [employer] per pt.) Communication Communication: No difficulties Dominant Hand: Right    Cognition  Cognition Arousal/Alertness: Awake/alert Behavior During Therapy: Anxious Overall Cognitive Status: Impaired/Different from baseline Area of Impairment: Orientation;Attention;Memory;Following commands;Safety/judgement;Awareness;Problem solving Orientation Level: Place;Time;Situation Current Attention Level: Sustained Memory: Decreased recall of precautions Following Commands: Follows one step commands inconsistently Safety/Judgement: Decreased awareness of safety;Decreased awareness of deficits Problem Solving: Slow processing;Difficulty sequencing;Requires verbal cues;Requires tactile cues General Comments: Pt with R/L discrimination issues when given verbal cues and very anxoius, not following all commands.    Extremity/Trunk Assessment Right Lower Extremity Assessment RLE ROM/Strength/Tone: Deficits;Unable to fully assess;Due to impaired cognition RLE ROM/Strength/Tone Deficits: weakness and shaky LEs observed with gait RLE Coordination: Deficits RLE Coordination Deficits: per functional observation Left Lower Extremity Assessment LLE ROM/Strength/Tone: Deficits LLE ROM/Strength/Tone Deficits: weakness and shaky LEs observed with gait LLE Coordination: Deficits LLE Coordination Deficits: per functional observation   Balance    End of Session PT - End of Session Activity Tolerance: Patient tolerated treatment well Patient left: in chair;with call bell/phone within reach;Other (comment) (in view of front desk as no chair alarms available) Nurse Communication: Mobility status  GP      Meya Clutter,KATHrine E 12/29/2012, 11:25 AM Zenovia Jarred, PT, DPT 12/29/2012 Pager: [phone]

## 2012-12-29 NOTE — Progress Notes (Signed)
CSW spoke with patient's wife, Eilene Ghazi (cell#: [phone]) re: discharge planning. CSW informed wife that due to patient's confusion & weakness - patient would need to go to SNF rehab before returning to [facility]. CSW called Joy @ [facility] & made them aware.   Wife has chosen bed offer @ [facility]. CSW has submitted information to [employer] for [employer].  Anticipating discharge tomorrow.   Clinical Social Work Department CLINICAL SOCIAL WORK PLACEMENT NOTE 12/29/2012  Patient:  Matthew Costa, Matthew Costa  Account Number:  [id number] Admit date:  12/23/2012  Clinical Social Worker:  Orpah Greek  Date/time:  12/29/2012 02:59 PM  Clinical Social Work is seeking post-discharge placement for this patient at the following level of care:   SKILLED NURSING   (*CSW will update this form in Epic as items are completed)   12/29/2012  Patient/family provided with [facility] Department of Clinical Social Work's list of facilities offering this level of care within the geographic area requested by the patient (or if unable, by the patient's family).  12/29/2012  Patient/family informed of their freedom to choose among providers that offer the needed level of care, that participate in Medicare, Medicaid or managed care program needed by the patient, have an available bed and are willing to accept the patient.  12/29/2012  Patient/family informed of [facility]' ownership interest in [facility], as well as of the fact that they are under no obligation to receive care at this facility.  PASARR submitted to EDS on 12/29/2012 PASARR number received from EDS on 12/29/2012  FL2 transmitted to all facilities in geographic area requested by pt/family on  12/29/2012 FL2 transmitted to all facilities within larger geographic area on 12/29/2012  Patient informed that his/her managed care company has contracts with or will negotiate with  certain  facilities, including the following:   [employer] informed of bed offers received:  12/29/2012 Patient chooses bed at [facility] Physician recommends and patient chooses bed at    Patient to be transferred to [facility] on   Patient to be transferred to facility by   The following physician request were entered in Epic:   Additional Comments:   Unice Bailey, LCSW [facility] Clinical Social Worker cell #: [phone]

## 2012-12-29 NOTE — Progress Notes (Signed)
[facility] PROGRESS NOTE  Matthew Costa ION:[id number] DOB: [birth year] DOA: 12/23/2012 PCP: [name], MD  Brief narrative 47 year old male patient with history of alcohol dependence and HTN was transferred from [name] to the [facility] ED for evaluation and management of alcohol withdrawal DTs. He was admitted to step down unit and initially required Precedex drip. Once he is stabilized, he was transferred to telemetry. Patient was briefly hypotensive on 4/29 and responded well to IV fluids. Continues to improve.  Assessment/Plan: 1. Alcohol dependence/alcohol withdrawal DTs: As indicated above, patient was initially admitted to step down unit and started on Precedex drip on 12/24/12, Librium and vitamins. Once he stabilized, he was transferred to telemetry. His CIWA score overnight apparently was 14. This morning however patient was calm, cooperative and coherent without any withdrawal features. Continue treatment and monitor. 2. Fall on 12/26/12, in step down unit with soft tissue injury of left knee: Patient denies pain. No acute findings on x-rays. 3. Hypertension: Patient was hypotensive on 4/29 which is resolved with IV fluids. Antihypertensives were held. Monitor. 4. Hypokalemia: Repleted 5. Transaminitis: Secondary to alcohol abuse. 6. Thrombocytopenia: Secondary to alcohol abuse. Resolved. 7. Macrocytic anemia: Follow CBC in a.m. 8. Tobacco abuse/chews: Nicotine patch.  Code Status: Full Family Communication: Will discuss with spouse. Disposition Plan: SNF when medically stable.   Consultants:  None  Procedures:  None  Antibiotics:  None   HPI/Subjective: Denies complaints and asking when he will be discharged.  Objective: Filed Vitals:   12/28/12 2209 12/29/12 0627 12/29/12 1202 12/29/12 1332  BP: 135/88 133/87 118/79 136/90  Pulse: 106 108  101  Temp: 98.4 F (36.9 C) 98.4 F (36.9 C)  97.7 F (36.5 C)  TempSrc: Oral Oral   Axillary  Resp: 18 18  19   Height:      Weight:      SpO2: 100% 99%  98%    Intake/Output Summary (Last 24 hours) at 12/29/12 1834 Last data filed at 12/29/12 1730  Gross per 24 hour  Intake 2861.67 ml  Output   4332 ml  Net -1470.33 ml   Filed Weights   12/23/12 2200 12/27/12 0600  Weight: 78.3 kg (172 lb 9.9 oz) 76.9 kg (169 lb 8.5 oz)    Exam:   General exam: Comfortable. Sitting on chair.  Respiratory system: Clear. No increased work of breathing.  Cardiovascular system: S1 & S2 heard, RRR-mildly tachycardic. No JVD, murmurs, gallops, clicks or pedal edema. Telemetry shows sinus tachycardia in the 110s-120s  Gastrointestinal system: Abdomen is nondistended, soft and nontender. Normal bowel sounds heard.  Central nervous system: Alert and oriented to person, place and partly to time (12/30/12). No focal neurological deficits.  Extremities: Symmetric 5 x 5 power. No tremors.   Data Reviewed: Basic Metabolic Panel:  Recent Labs Lab 12/23/12 1900 12/23/12 2100  12/25/12 0416 12/26/12 0340 12/27/12 0336 12/28/12 0520 12/29/12 0535  NA 137  --   < > 143 136 138 138 139  K 2.5*  --   < > 3.3* 3.5 4.5 4.4 3.7  CL 92*  --   < > 103 101 103 102 105  CO2 28  --   < > 28 27 28 28 24   GLUCOSE 118*  --   < > 102* 94 100* 108* 133*  BUN 4*  --   < > 4* 4* 5* 8 6  CREATININE 0.45*  --   < > 0.55 0.62 0.65 0.72 0.66  CALCIUM 9.2  --   < >  9.1 8.9 9.2 9.8 9.1  MG  --  1.3*  < > 2.1 1.7 2.2 2.1 1.9  PHOS  --  2.4  --   --   --   --   --   --   < > = values in this interval not displayed. Liver Function Tests:  Recent Labs Lab 12/23/12 1900 12/24/12 0754  AST 116* 96*  ALT 51 45  ALKPHOS 76 77  BILITOT 1.8* 2.2*  PROT 7.3 7.4  ALBUMIN 3.5 3.5   No results found for this basename: LIPASE, AMYLASE,  in the last 168 hours No results found for this basename: AMMONIA,  in the last 168 hours CBC:  Recent Labs Lab 12/23/12 1900 12/24/12 0754 12/28/12 0520  12/28/12 1804  WBC 4.9 5.7 5.5 6.2  NEUTROABS 3.5  --   --   --   HGB 12.7* 13.7 13.1 11.8*  HCT 36.4* 39.2 39.2 35.8*  MCV 96.3 97.5 101.0* 101.4*  PLT PLATELET CLUMPS NOTED ON SMEAR, COUNT APPEARS DECREASED 77* 160 183   Cardiac Enzymes: No results found for this basename: CKTOTAL, CKMB, CKMBINDEX, TROPONINI,  in the last 168 hours BNP (last 3 results) No results found for this basename: PROBNP,  in the last 8760 hours CBG:  Recent Labs Lab 12/26/12 0752 12/27/12 0744 12/28/12 0735 12/29/12 0742 12/29/12 1159  GLUCAP 89 104* 103* 99 102*    Recent Results (from the past 240 hour(s))  MRSA PCR SCREENING     Status: None   Collection Time    12/23/12 10:19 PM      Result Value Range Status   MRSA by PCR NEGATIVE  NEGATIVE Final   Comment:            The GeneXpert MRSA Assay (FDA     approved for NASAL specimens     only), is one component of a     comprehensive MRSA colonization     surveillance program. It is not     intended to diagnose MRSA     infection nor to guide or     monitor treatment for     MRSA infections.  URINE CULTURE     Status: None   Collection Time    12/26/12  6:40 AM      Result Value Range Status   Specimen Description URINE, CLEAN CATCH   Final   Special Requests NONE   Final   Culture  Setup Time 12/26/2012 11:50   Final   Colony Count 45,000 COLONIES/ML   Final   Culture     Final   Value: Multiple bacterial morphotypes present, none predominant. Suggest appropriate recollection if clinically indicated.   Report Status 12/28/2012 FINAL   Final     Studies: No results found.   Additional labs:   Scheduled Meds: . aspirin  325 mg Oral Daily  . chlordiazePOXIDE  15 mg Oral TID  . folic acid  1 mg Oral Daily  . multivitamin with minerals  1 tablet Oral Daily  . nicotine  14 mg Transdermal Daily  . thiamine  100 mg Oral Daily   Continuous Infusions:    Principal Problem:   Alcohol withdrawal Active Problems:   Alcohol  abuse   HTN (hypertension)   Hypokalemia   Transaminitis    Time spent: 35 minutes    [facility]  [facility] Pager [phone].   If 8PM-8AM, please contact night-coverage at [email or web address], password [email or web address] 12/29/2012, 6:34 PM  LOS: 6 days

## 2012-12-29 NOTE — Progress Notes (Signed)
Patient fell on the floor on his buttocks while trying to get out of bed. Stood up as soon as he hit the floor. Denise NT rushed to his room as soon as his bed alarm went off, she witnessed him fell. He was alert, oriented to person and place at the time of the incident. Patient remained weak and unsteady on his feet. He has no complaints of pain, skin intact, move all extremities without any complaints. Vital signs were stable. Spoke to his wife on the phone regarding what happened. Paged Donnamarie Poag NT, no new orders made. Will continue to monitor patient.

## 2012-12-30 LAB — HEPATIC FUNCTION PANEL
AST: 34 U/L (ref 0–37)
Albumin: 3.5 g/dL (ref 3.5–5.2)
Alkaline Phosphatase: 86 U/L (ref 39–117)
Total Bilirubin: 1.1 mg/dL (ref 0.3–1.2)

## 2012-12-30 LAB — CBC
HCT: 41.8 % (ref 39.0–52.0)
Hemoglobin: 14.2 g/dL (ref 13.0–17.0)
WBC: 7.3 10*3/uL (ref 4.0–10.5)

## 2012-12-30 MED ORDER — SENNA 8.6 MG PO TABS
2.0000 | ORAL_TABLET | Freq: Every day | ORAL | Status: DC
  Administered 2012-12-30 – 2013-01-01 (×3): 17.2 mg via ORAL
  Filled 2012-12-30 (×5): qty 1

## 2012-12-30 MED ORDER — CHLORDIAZEPOXIDE HCL 5 MG PO CAPS
20.0000 mg | ORAL_CAPSULE | Freq: Three times a day (TID) | ORAL | Status: DC
  Administered 2012-12-30 – 2013-01-01 (×5): 20 mg via ORAL
  Filled 2012-12-30 (×5): qty 4

## 2012-12-30 MED ORDER — POLYETHYLENE GLYCOL 3350 17 G PO PACK
17.0000 g | PACK | Freq: Every day | ORAL | Status: DC
  Administered 2012-12-30 – 2013-01-01 (×3): 17 g via ORAL
  Filled 2012-12-30 (×4): qty 1

## 2012-12-30 MED ORDER — BETAMETHASONE VALERATE 0.1 % EX LOTN
TOPICAL_LOTION | Freq: Two times a day (BID) | CUTANEOUS | Status: DC
  Administered 2012-12-30 – 2013-01-04 (×10): via TOPICAL
  Filled 2012-12-30: qty 60

## 2012-12-30 NOTE — Progress Notes (Addendum)
[facility] PROGRESS NOTE  Matthew Costa XBJ:[id number] DOB: Mar 04, 1966 DOA: 12/23/2012 PCP: [name], MD  Brief narrative 47 year old male patient with history of alcohol dependence and HTN was transferred from [name] to the [facility] ED for evaluation and management of alcohol withdrawal DTs. He was admitted to step down unit and initially required Precedex drip. Once he is stabilized, he was transferred to telemetry. Patient was briefly hypotensive on 4/29 and responded well to IV fluids. Continues to improve.  Assessment/Plan: 1. Alcohol dependence/alcohol withdrawal DTs: As indicated above, patient was initially admitted to step down unit and started on Precedex drip on 12/24/12, Librium and vitamins. Once he stabilized, he was transferred to telemetry. His CIWA score are fluctuating and elevated. Overnight apparently had some visual hallucinations. This morning however patient continued to be calm, cooperative and coherent without any withdrawal features. Increase Librium slightly and then gradually taper. 2. Fall on 12/26/12, in step down unit with soft tissue injury of left knee: Patient denies pain. No acute findings on x-rays. 3. Hypertension: Patient was hypotensive on 4/29 which is resolved with IV fluids. Antihypertensives were held. Monitor-mostly normotensive. 4. Hypokalemia: Repleted 5. Transaminitis: Secondary to alcohol abuse. 6. Thrombocytopenia: Secondary to alcohol abuse. Resolved. 7. Macrocytic anemia:  stable. 8. Tobacco abuse/chews: Nicotine patch. 9. ? Seborrheic dermatitis of face & scalp: Topical steroids.  Code Status: Full Family Communication: Discussed with spouse on 4/30. Disposition Plan: SNF when medically stable-not yet stable for discharge.   Consultants:  None  Procedures:  None  Antibiotics:  None   HPI/Subjective: Denies complaints. Per nursing, overnight visual hallucinations overnight and tried to get of bed  and slid to the floor without injuries.  Objective: Filed Vitals:   12/30/12 0800 12/30/12 1431 12/30/12 1518 12/30/12 1620  BP: 135/62 118/84 135/70 130/66  Pulse: 94 112 110 94  Temp:  98.4 F (36.9 C)    TempSrc:  Oral    Resp:  16    Height:      Weight:      SpO2:  99%      Intake/Output Summary (Last 24 hours) at 12/30/12 1656 Last data filed at 12/30/12 1432  Gross per 24 hour  Intake    960 ml  Output   2175 ml  Net  -1215 ml   Filed Weights   12/23/12 2200 12/27/12 0600  Weight: 78.3 kg (172 lb 9.9 oz) 76.9 kg (169 lb 8.5 oz)    Exam:   General exam: Comfortable. Sitting on chair.  Respiratory system: Clear. No increased work of breathing.  Cardiovascular system: S1 & S2 heard, RRR-mildly tachycardic. No JVD, murmurs, gallops, clicks or pedal edema.   Gastrointestinal system: Abdomen is nondistended, soft and nontender. Normal bowel sounds heard.  Central nervous system: Alert and oriented x3. No focal neurological deficits.  Extremities: Symmetric 5 x 5 power. No tremors.   Skin: Patchy red scaly lesion on cheeks and nose - ? Seborrheic dermatitis   Data Reviewed: Basic Metabolic Panel:  Recent Labs Lab 12/23/12 1900 12/23/12 2100  12/25/12 0416 12/26/12 0340 12/27/12 0336 12/28/12 0520 12/29/12 0535  NA 137  --   < > 143 136 138 138 139  K 2.5*  --   < > 3.3* 3.5 4.5 4.4 3.7  CL 92*  --   < > 103 101 103 102 105  CO2 28  --   < > 28 27 28 28 24   GLUCOSE 118*  --   < > 102*  94 100* 108* 133*  BUN 4*  --   < > 4* 4* 5* 8 6  CREATININE 0.45*  --   < > 0.55 0.62 0.65 0.72 0.66  CALCIUM 9.2  --   < > 9.1 8.9 9.2 9.8 9.1  MG  --  1.3*  < > 2.1 1.7 2.2 2.1 1.9  PHOS  --  2.4  --   --   --   --   --   --   < > = values in this interval not displayed. Liver Function Tests:  Recent Labs Lab 12/23/12 1900 12/24/12 0754 12/30/12 0510  AST 116* 96* 34  ALT 51 45 24  ALKPHOS 76 77 86  BILITOT 1.8* 2.2* 1.1  PROT 7.3 7.4 7.7  ALBUMIN 3.5  3.5 3.5   No results found for this basename: LIPASE, AMYLASE,  in the last 168 hours No results found for this basename: AMMONIA,  in the last 168 hours CBC:  Recent Labs Lab 12/23/12 1900 12/24/12 0754 12/28/12 0520 12/28/12 1804 12/30/12 0510  WBC 4.9 5.7 5.5 6.2 7.3  NEUTROABS 3.5  --   --   --   --   HGB 12.7* 13.7 13.1 11.8* 14.2  HCT 36.4* 39.2 39.2 35.8* 41.8  MCV 96.3 97.5 101.0* 101.4* 99.8  PLT PLATELET CLUMPS NOTED ON SMEAR, COUNT APPEARS DECREASED 77* 160 183 252   Cardiac Enzymes: No results found for this basename: CKTOTAL, CKMB, CKMBINDEX, TROPONINI,  in the last 168 hours BNP (last 3 results) No results found for this basename: PROBNP,  in the last 8760 hours CBG:  Recent Labs Lab 12/27/12 0744 12/28/12 0735 12/29/12 0742 12/29/12 1159 12/30/12 0756  GLUCAP 104* 103* 99 102* 95    Recent Results (from the past 240 hour(s))  MRSA PCR SCREENING     Status: None   Collection Time    12/23/12 10:19 PM      Result Value Range Status   MRSA by PCR NEGATIVE  NEGATIVE Final   Comment:            The GeneXpert MRSA Assay (FDA     approved for NASAL specimens     only), is one component of a     comprehensive MRSA colonization     surveillance program. It is not     intended to diagnose MRSA     infection nor to guide or     monitor treatment for     MRSA infections.  URINE CULTURE     Status: None   Collection Time    12/26/12  6:40 AM      Result Value Range Status   Specimen Description URINE, CLEAN CATCH   Final   Special Requests NONE   Final   Culture  Setup Time 12/26/2012 11:50   Final   Colony Count 45,000 COLONIES/ML   Final   Culture     Final   Value: Multiple bacterial morphotypes present, none predominant. Suggest appropriate recollection if clinically indicated.   Report Status 12/28/2012 FINAL   Final     Studies: No results found.   Additional labs:   Scheduled Meds: . aspirin  325 mg Oral Daily  . betamethasone  valerate lotion   Topical BID  . chlordiazePOXIDE  20 mg Oral TID  . folic acid  1 mg Oral Daily  . multivitamin with minerals  1 tablet Oral Daily  . nicotine  14 mg Transdermal Daily  . polyethylene glycol  17 g Oral Daily  . senna  2 tablet Oral Daily  . thiamine  100 mg Oral Daily   Continuous Infusions:    Principal Problem:   Alcohol withdrawal Active Problems:   Alcohol abuse   HTN (hypertension)   Hypokalemia   Transaminitis   Fall    Time spent: 35 minutes    [facility]  [facility] Pager [phone].   If 8PM-8AM, please contact night-coverage at [email or web address], password [facility] 12/30/2012, 4:56 PM  LOS: 7 days

## 2012-12-30 NOTE — Progress Notes (Signed)
Physical Therapy Treatment Patient Details Name: Matthew Costa MRN: [id number] DOB: 1966-02-21 Today's Date: 12/30/2012 Time: 4098-1191 PT Time Calculation (min): 25 min  PT Assessment / Plan / Recommendation Comments on Treatment Session  Pt ambulated in hallway and performed LE exercises.  Pt requires increased cues for exercises and states "it's like learning everything all over."  Pt presents with decreased balance as standing exercises were very challenging and SLS on each leg less than 30 sec.    Follow Up Recommendations  SNF;Supervision/Assistance - 24 hour     Does the patient have the potential to tolerate intense rehabilitation     Barriers to Discharge        Equipment Recommendations  Rolling walker with 5" wheels    Recommendations for Other Services    Frequency     Plan Discharge plan remains appropriate;Frequency remains appropriate    Precautions / Restrictions Precautions Precautions: Fall Precaution Comments: Pt very high fall risk due to instability and decreased awareness/safety and judgement. Restrictions Weight Bearing Restrictions: No   Pertinent Vitals/Pain n/a    Mobility  Bed Mobility Bed Mobility: Supine to Sit;Sit to Supine Supine to Sit: 5: Supervision Sit to Supine: 5: Supervision Details for Bed Mobility Assistance: Pt attempting to scoot past bed rail to get OOB again today, increased time to bring LEs onto bed Transfers Transfers: Sit to Stand;Stand to Sit Sit to Stand: 4: Min assist;With upper extremity assist;From bed Stand to Sit: With upper extremity assist;4: Min guard;To bed Details for Transfer Assistance: assist to steady upon standing, multiple verbal cues for safety Ambulation/Gait Ambulation/Gait Assistance: 4: Min assist Ambulation Distance (Feet): 140 Feet Assistive device: Rolling walker Ambulation/Gait Assistance Details: pt did much better with ambulation today however assist required for negotiating RW and  staying closer to RW Gait Pattern: Step-through pattern;Decreased stride length;Narrow base of support;Trunk flexed Gait velocity: decreased    Exercises General Exercises - Lower Extremity Ankle Circles/Pumps: AROM;Both;20 reps Short Arc Quad: AROM;Both;20 reps Long Arc Quad: AROM;Both;20 reps Heel Slides: AROM;Both;20 reps Hip ABduction/ADduction: 20 reps;Supine;Other (comment);AROM (performed 5 each LE in standing as well) Straight Leg Raises: AROM;Limitations;Both;20 reps Straight Leg Raises Limitations: low leg lift (not full range) Hip Flexion/Marching: AROM;Seated;Standing;Both;15 reps Toe Raises: Limitations Toe Raises Limitations: unable to perform in standing   PT Diagnosis:    PT Problem List:   PT Treatment Interventions:     PT Goals Acute Rehab PT Goals PT Goal Formulation: With patient Time For Goal Achievement: 01/12/13 Potential to Achieve Goals: Good Pt will go Sit to Stand: with modified independence PT Goal: Sit to Stand - Progress: Progressing toward goal Pt will go Stand to Sit: with modified independence PT Goal: Stand to Sit - Progress: Progressing toward goal Pt will Ambulate: 1 - 15 feet;51 - 150 feet;with supervision;with least restrictive assistive device PT Goal: Ambulate - Progress: Progressing toward goal Pt will Perform Home Exercise Program: with supervision, verbal cues required/provided PT Goal: Perform Home Exercise Program - Progress: Progressing toward goal  Visit Information  Last PT Received On: 12/30/12 Assistance Needed: +1    Subjective Data  Subjective: Yesterday was embarassing.   Cognition  Cognition Arousal/Alertness: Awake/alert Behavior During Therapy: Anxious Overall Cognitive Status: Impaired/Different from baseline Area of Impairment: Memory;Following commands;Safety/judgement;Awareness;Problem solving Current Attention Level: Sustained Memory: Decreased recall of precautions Following Commands: Follows one step  commands consistently Safety/Judgement: Decreased awareness of safety;Decreased awareness of deficits Problem Solving: Difficulty sequencing;Requires verbal cues General Comments: pt less confused today and able to follow  commands better    Balance  Balance Balance Assessed: Yes Static Standing Balance Single Leg Stance - Right Leg: 20 Single Leg Stance - Left Leg: 8  End of Session PT - End of Session Activity Tolerance: Patient tolerated treatment well Patient left: in bed;with bed alarm set;with family/visitor present   GP     Marilynne Dupuis,KATHrine E 12/30/2012, 10:49 AM Zenovia Jarred, PT, DPT 12/30/2012 Pager: [phone]

## 2012-12-31 ENCOUNTER — Inpatient Hospital Stay (HOSPITAL_COMMUNITY): Payer: 59

## 2012-12-31 LAB — GLUCOSE, CAPILLARY: Glucose-Capillary: 109 mg/dL — ABNORMAL HIGH (ref 70–99)

## 2012-12-31 MED ORDER — ENSURE COMPLETE PO LIQD
237.0000 mL | Freq: Two times a day (BID) | ORAL | Status: DC
  Administered 2013-01-01 – 2013-01-04 (×4): 237 mL via ORAL

## 2012-12-31 MED ORDER — CLONIDINE HCL 0.1 MG PO TABS
0.1000 mg | ORAL_TABLET | Freq: Two times a day (BID) | ORAL | Status: DC
  Administered 2012-12-31 – 2013-01-04 (×8): 0.1 mg via ORAL
  Filled 2012-12-31 (×9): qty 1

## 2012-12-31 NOTE — Progress Notes (Signed)
INITIAL NUTRITION ASSESSMENT  DOCUMENTATION CODES Per approved criteria  -Not Applicable   INTERVENTION: Recommend advancing diet to regular Provide Ensure Complete po BID, each supplement provides 350 kcal and 13 grams of protein. Pt receiving Multivitamin with minerals, Folvite, and thiamine daily  NUTRITION DIAGNOSIS: Inadequate oral intake related to medical condition as evidenced by 2% wt loss in 1 week.   Goal: Pt to meet >/= 90% of their estimated nutrition needs  Monitor:  PO intake Wt Labs  Reason for Assessment: MST  47 y.o. male  Admitting Dx: Alcohol withdrawal  ASSESSMENT: 47 year old male patient with history of alcohol dependence and HTN was transferred from [facility] to the [facility] ED for evaluation and management of alcohol withdrawal DTs. He was admitted to step down unit and initially required Precedex drip. Once he is stabilized, he was transferred to telemetry. Patient was briefly hypotensive on 4/29 and responded well to IV fluids. Continues to improve.  Pt reports that his appetite is good and he has been eating 75% of 3 meals daily for the past 3 days. Pt stated that he was just on fluids prior to that but, pt has been assigned a diet since admission. When RD asked pt if he has been eating well he said yes but, family members behind him shook there heads no. Pt reports that his usual weight is 175 lbs; wt history shows 3 lb wt loss in the past week. Pt also reports that he has been denied some food items when ordering due to being on mechanical soft diet.  Height: Ht Readings from Last 1 Encounters:  12/23/12 6' (1.829 m)    Weight: Wt Readings from Last 1 Encounters:  12/27/12 169 lb 8.5 oz (76.9 kg)    Ideal Body Weight: 178 lbs  % Ideal Body Weight: 95%  Wt Readings from Last 10 Encounters:  12/27/12 169 lb 8.5 oz (76.9 kg)    Usual Body Weight: 175 lbs  % Usual Body Weight: 97%  BMI:  Body mass index is 22.99  kg/(m^2).  Estimated Nutritional Needs: Kcal: 1925-2310 Protein: 77-92 grams Fluid: 1.9-2.3 L  Skin: intact, dry, flaky  Diet Order: Dysphagia  EDUCATION NEEDS: -No education needs identified at this time   Intake/Output Summary (Last 24 hours) at 12/31/12 1654 Last data filed at 12/31/12 1500  Gross per 24 hour  Intake    360 ml  Output      0 ml  Net    360 ml    Last BM: 5/1  Labs:   Recent Labs Lab 12/27/12 0336 12/28/12 0520 12/29/12 0535  NA 138 138 139  K 4.5 4.4 3.7  CL 103 102 105  CO2 28 28 24   BUN 5* 8 6  CREATININE 0.65 0.72 0.66  CALCIUM 9.2 9.8 9.1  MG 2.2 2.1 1.9  GLUCOSE 100* 108* 133*    CBG (last 3)   Recent Labs  12/29/12 1159 12/30/12 0756 12/31/12 0852  GLUCAP 102* 95 109*    Scheduled Meds: . aspirin  325 mg Oral Daily  . betamethasone valerate lotion   Topical BID  . chlordiazePOXIDE  20 mg Oral TID  . folic acid  1 mg Oral Daily  . multivitamin with minerals  1 tablet Oral Daily  . nicotine  14 mg Transdermal Daily  . polyethylene glycol  17 g Oral Daily  . senna  2 tablet Oral Daily  . thiamine  100 mg Oral Daily    Continuous Infusions:  Past Medical History  Diagnosis Date  . Hypertension     Past Surgical History  Procedure Laterality Date  . Cardiac catheterization      Ian Malkin RD, LDN Inpatient Clinical Dietitian Pager: [phone] After Hours Pager: [phone]

## 2012-12-31 NOTE — Progress Notes (Signed)
Patient still confuse and getting out of bed, needing frequent reinforcement and extra time to redirect patient, very unsteady gait. Dr. Waymon Amato informed. Will continue to assess patient.

## 2012-12-31 NOTE — Progress Notes (Addendum)
[facility] PROGRESS NOTE  Matthew Costa XBJ:[id number] DOB: Apr 14, 1966 DOA: 12/23/2012 PCP: [name], MD  Brief narrative 47 year old male patient with history of alcohol dependence and HTN was transferred from [name] to [facility] ED for evaluation and management of alcohol withdrawal DTs. He was admitted to step down unit and initially required Precedex drip. Once he is stabilized, he was transferred to telemetry. Patient was briefly hypotensive on 4/29 and responded well to IV fluids. Continues to improve.  Assessment/Plan: 1. Alcohol dependence/alcohol withdrawal DT's/Encephalopathy: As indicated above, patient was initially admitted to step down unit and started on Precedex drip on 12/24/12, Librium and vitamins. Once he stabilized, he was transferred to telemetry. Despite treatment for alcohol withdrawal for almost a week, patient has intermittent hallucinations, agitation and confusion. Per discussion with spouse, patient has been having periodic lapses in memory and? Confabulation for some time. Psychiatry consultation requested on 5/2 to assist with further evaluation and management. Place a Comptroller for safety. We'll check urine microscopy (rpt), CT head, ammonia level, TSH, B12 level and RPR. 2. Fall on 12/26/12, in step down unit with soft tissue injury of left knee: Patient denies pain. No acute findings on x-rays. 3. Hypertension: Patient was hypotensive on 4/29 which is resolved with IV fluids. Antihypertensives were held-we'll resume low dose clonidine 4. Hypokalemia: Repleted 5. Transaminitis: Secondary to alcohol abuse. 6. Thrombocytopenia: Secondary to alcohol abuse. Resolved. 7. Macrocytic anemia:  stable. 8. Tobacco abuse/chews: Nicotine patch. 9. ? Seborrheic dermatitis of face & scalp: Topical steroids.  Code Status: Full Family Communication: Discussed with spouse at bedside on 47 Disposition Plan: SNF when medically stable-not yet stable  for discharge.   Consultants:  Psychiatry-pending  Procedures:  None  Antibiotics:  None   HPI/Subjective: Patient seems coherent at one time and that the next moment seems confused but not agitated. Per nursing, hallucinations-seeing his dog and cat in the room although he does not have pets.  Tries frequently to get out of bed. Unsteady.   Objective: Filed Vitals:   12/30/12 1620 12/30/12 2235 12/31/12 0451 12/31/12 1500  BP: 130/66 113/79 140/96 114/79  Pulse: 94 106 134 132  Temp:  98.2 F (36.8 C) 98.1 F (36.7 C) 97.8 F (36.6 C)  TempSrc:  Oral Oral Oral  Resp:   20 20  Height:      Weight:      SpO2:  98% 100% 100%    Intake/Output Summary (Last 24 hours) at 12/31/12 1836 Last data filed at 12/31/12 1700  Gross per 24 hour  Intake    410 ml  Output      0 ml  Net    410 ml   Filed Weights   12/23/12 2200 12/27/12 0600  Weight: 78.3 kg (172 lb 9.9 oz) 76.9 kg (169 lb 8.5 oz)    Exam:   General exam:  lying in bed, fidgety but in no obvious distress.   Respiratory system: Clear. No increased work of breathing.  Cardiovascular system: S1 & S2 heard, RRR-mildly tachycardic. No JVD, murmurs, gallops, clicks or pedal edema. Telemetry shows sinus tachycardia in the 110s-130s.   Gastrointestinal system: Abdomen is nondistended, soft and nontender. Normal bowel sounds heard.  Central nervous system: Alert and oriented x2. No focal neurological deficits.  Extremities: Symmetric 5 x 5 power. No tremors.   Skin: Patchy red scaly lesion on cheeks and nose - ? Seborrheic dermatitis  Psychiatric: Slightly anxious but not tremulous and? Confabulation    Data  Reviewed: Basic Metabolic Panel:  Recent Labs Lab 12/25/12 0416 12/26/12 0340 12/27/12 0336 12/28/12 0520 12/29/12 0535  NA 143 136 138 138 139  K 3.3* 3.5 4.5 4.4 3.7  CL 103 101 103 102 105  CO2 28 27 28 28 24   GLUCOSE 102* 94 100* 108* 133*  BUN 4* 4* 5* 8 6  CREATININE 0.55 0.62 0.65  0.72 0.66  CALCIUM 9.1 8.9 9.2 9.8 9.1  MG 2.1 1.7 2.2 2.1 1.9   Liver Function Tests:  Recent Labs Lab 12/30/12 0510  AST 34  ALT 24  ALKPHOS 86  BILITOT 1.1  PROT 7.7  ALBUMIN 3.5   No results found for this basename: LIPASE, AMYLASE,  in the last 168 hours No results found for this basename: AMMONIA,  in the last 168 hours CBC:  Recent Labs Lab 12/28/12 0520 12/28/12 1804 12/30/12 0510  WBC 5.5 6.2 7.3  HGB 13.1 11.8* 14.2  HCT 39.2 35.8* 41.8  MCV 101.0* 101.4* 99.8  PLT 160 183 252   Cardiac Enzymes: No results found for this basename: CKTOTAL, CKMB, CKMBINDEX, TROPONINI,  in the last 168 hours BNP (last 3 results) No results found for this basename: PROBNP,  in the last 8760 hours CBG:  Recent Labs Lab 12/28/12 0735 12/29/12 0742 12/29/12 1159 12/30/12 0756 12/31/12 0852  GLUCAP 103* 99 102* 95 109*    Recent Results (from the past 240 hour(s))  MRSA PCR SCREENING     Status: None   Collection Time    12/23/12 10:19 PM      Result Value Range Status   MRSA by PCR NEGATIVE  NEGATIVE Final   Comment:            The GeneXpert MRSA Assay (FDA     approved for NASAL specimens     only), is one component of a     comprehensive MRSA colonization     surveillance program. It is not     intended to diagnose MRSA     infection nor to guide or     monitor treatment for     MRSA infections.  URINE CULTURE     Status: None   Collection Time    12/26/12  6:40 AM      Result Value Range Status   Specimen Description URINE, CLEAN CATCH   Final   Special Requests NONE   Final   Culture  Setup Time 12/26/2012 11:50   Final   Colony Count 45,000 COLONIES/ML   Final   Culture     Final   Value: Multiple bacterial morphotypes present, none predominant. Suggest appropriate recollection if clinically indicated.   Report Status 12/28/2012 FINAL   Final     Studies: No results found.   Additional labs:   Scheduled Meds: . aspirin  325 mg Oral Daily  .  betamethasone valerate lotion   Topical BID  . chlordiazePOXIDE  20 mg Oral TID  . [START ON 01/01/2013] feeding supplement  237 mL Oral BID BM  . folic acid  1 mg Oral Daily  . multivitamin with minerals  1 tablet Oral Daily  . nicotine  14 mg Transdermal Daily  . polyethylene glycol  17 g Oral Daily  . senna  2 tablet Oral Daily  . thiamine  100 mg Oral Daily   Continuous Infusions:    Principal Problem:   Alcohol withdrawal Active Problems:   Alcohol abuse   HTN (hypertension)   Hypokalemia   Transaminitis  Fall    Time spent: 35 minutes    [facility]  [facility] Pager [phone].   If 8PM-8AM, please contact night-coverage at [email or web address], password [facility] 12/31/2012, 6:36 PM  LOS: 8 days

## 2012-12-31 NOTE — Progress Notes (Signed)
CSW continues to follow for discharge planning. Patient was admitted from [facility] though per PT has become increasingly weak throughout hospital stay and requiring SNF stay before returning to [facility]. Wife has chosen bed offer at [facility] (ph#: [phone]) - CSW will follow-up Monday.   Unice Bailey, LCSW [facility] Clinical Social Worker cell #: [phone]

## 2012-12-31 NOTE — Consult Note (Signed)
Reason for Consult:Alcohol withdrawal and dependency s/p Dt's Referring Physician: Dr. Charlotte Sanes Lamorte is an 47 y.o. male.  HPI: Patient has stated he failed to wean off his alcohol dependence without successful and than [facility] but referred to [facility] due to uncontrollable symptoms of withdrawal. He has few symptoms of withdrawal at this time but stated that he has occasional visual hallucinations - seeing cat or dog in hospital bed. He has feeling guilty about drinking and putting too much stress on his family. His mother was at bedside and stated that he has too much medication which might cause of his weakness and unsteady on his feet. He denied symptoms of anxiety but endorses depressive symptoms. He wishes to continue alcohol rehab treatment and his family is supportive.   MSE: Patient was in his bed, uncomfortably semi sitting position, when asked stated that he has been constipated x 5 days. He is awake, alert. Oriented x 4. He has normal speech but low volume. He has normal thought process and denied SI/HI. He has no evidence of LOA/FOI. He has feeling of regrets. He has fair insight, judgment and impulse control.  Past Medical History  Diagnosis Date  . Hypertension     Past Surgical History  Procedure Laterality Date  . Cardiac catheterization      History reviewed. No pertinent family history.  Social History:  reports that he has quit smoking. He has never used smokeless tobacco. He reports that  drinks alcohol. He reports that he does not use illicit drugs.  Allergies: No Known Allergies  Medications: I have reviewed the patient's current medications.  Results for orders placed during the hospital encounter of 12/23/12 (from the past 48 hour(s))  CBC     Status: Abnormal   Collection Time    12/30/12  5:10 AM      Result Value Range   WBC 7.3  4.0 - 10.5 K/uL   RBC 4.19 (*) 4.22 - 5.81 MIL/uL   Hemoglobin 14.2  13.0 - 17.0 g/dL   Comment: DELTA CHECK NOTED     REPEATED TO VERIFY   HCT 41.8  39.0 - 52.0 %   MCV 99.8  78.0 - 100.0 fL   MCH 33.9  26.0 - 34.0 pg   MCHC 34.0  30.0 - 36.0 g/dL   RDW 13.0  86.5 - 78.4 %   Platelets 252  150 - 400 K/uL   Comment: DELTA CHECK NOTED     REPEATED TO VERIFY  HEPATIC FUNCTION PANEL     Status: Abnormal   Collection Time    12/30/12  5:10 AM      Result Value Range   Total Protein 7.7  6.0 - 8.3 g/dL   Albumin 3.5  3.5 - 5.2 g/dL   AST 34  0 - 37 U/L   ALT 24  0 - 53 U/L   Alkaline Phosphatase 86  39 - 117 U/L   Total Bilirubin 1.1  0.3 - 1.2 mg/dL   Bilirubin, Direct 0.4 (*) 0.0 - 0.3 mg/dL   Indirect Bilirubin 0.7  0.3 - 0.9 mg/dL  GLUCOSE, CAPILLARY     Status: None   Collection Time    12/30/12  7:56 AM      Result Value Range   Glucose-Capillary 95  70 - 99 mg/dL  GLUCOSE, CAPILLARY     Status: Abnormal   Collection Time    12/31/12  8:52 AM      Result  Value Range   Glucose-Capillary 109 (*) 70 - 99 mg/dL    No results found.  Positive for depression and excessive alcohol consumption Blood pressure 114/79, pulse 132, temperature 97.8 F (36.6 C), temperature source Oral, resp. rate 20, height 6' (1.829 m), weight 169 lb 8.5 oz (76.9 kg), SpO2 100.00%.   Assessment/Plan: [facility] - getting better Alcohol dependence and withdrawal syndrome  Recommendation: Patient does not meet criteria for acute psychiatric hospitalization. He may benefit from brief physical therapy. May use colace 100 mg BID for constipation if needed. Will discontinue Librium and ativan and keep librium 25 mg QID/PRN for withdrawal symptoms. 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 Refer for inpatient alcohol rehab center, may go to [facility] as they previously planned when medically cleared or/and refer to psych social worker for appropriate available local facilities.  Enedelia Martorelli,JANARDHAHA R. 12/31/2012, 6:46 PM

## 2013-01-01 LAB — URINALYSIS, ROUTINE W REFLEX MICROSCOPIC
Glucose, UA: NEGATIVE mg/dL
Hgb urine dipstick: NEGATIVE
Protein, ur: NEGATIVE mg/dL
Urobilinogen, UA: >8 mg/dL — ABNORMAL HIGH (ref 0.0–1.0)

## 2013-01-01 LAB — URINE MICROSCOPIC-ADD ON

## 2013-01-01 LAB — TSH: TSH: 0.503 u[IU]/mL (ref 0.350–4.500)

## 2013-01-01 MED ORDER — ESCITALOPRAM OXALATE 10 MG PO TABS
10.0000 mg | ORAL_TABLET | Freq: Every day | ORAL | Status: DC
  Administered 2013-01-01 – 2013-01-04 (×4): 10 mg via ORAL
  Filled 2013-01-01 (×4): qty 1

## 2013-01-01 MED ORDER — CHLORDIAZEPOXIDE HCL 25 MG PO CAPS
25.0000 mg | ORAL_CAPSULE | Freq: Four times a day (QID) | ORAL | Status: DC | PRN
  Administered 2013-01-01 – 2013-01-03 (×4): 25 mg via ORAL
  Filled 2013-01-01 (×7): qty 1

## 2013-01-01 MED ORDER — RISPERIDONE 0.5 MG PO TABS
0.5000 mg | ORAL_TABLET | Freq: Two times a day (BID) | ORAL | Status: DC
  Administered 2013-01-01 – 2013-01-04 (×7): 0.5 mg via ORAL
  Filled 2013-01-01 (×8): qty 1

## 2013-01-01 NOTE — Progress Notes (Signed)
[facility] PROGRESS NOTE  Matthew Costa EAV:[id number] DOB: Feb 03, 1966 DOA: 12/23/2012 PCP: [name], MD  Brief narrative 47 year old male patient with history of alcohol dependence and HTN was transferred from [name] ED for evaluation and management of alcohol withdrawal DTs. He was admitted to step down unit and initially required Precedex drip. Once he is stabilized, he was transferred to telemetry. Patient was briefly hypotensive on 4/29 and responded well to IV fluids. Continues to improve.  Assessment/Plan: 1. Alcohol dependence/alcohol withdrawal DT's/Encephalopathy: As indicated above, patient was initially admitted to step down unit and started on Precedex drip on 12/24/12, Librium and vitamins. Once he stabilized, he was transferred to telemetry. Despite treatment for alcohol withdrawal for almost a week, patient has intermittent hallucinations, agitation and confusion. Per discussion with spouse, patient has been having periodic lapses in memory and? Confabulation for some time. Sitter for safety in place. Negative urine microscopy (rpt), CT head, ammonia level, TSH, B12 level and RPR. Psychiatry input appreciated-suggest improving DTs, alcoholic psychosis and depression-changed Librium to when necessary, DC'd Ativan, starting Lexapro and Risperdal. Monitor. QTC on 4/28:457 ms. 2. Fall on 12/26/12, in step down unit with soft tissue injury of left knee: Patient denies pain. No acute findings on x-rays. 3. Hypertension: Patient was hypotensive on 4/29 which is resolved with IV fluids. Antihypertensives were held-an low dose clonidine. Controlled 4. Hypokalemia: Repleted 5. Transaminitis: Secondary to alcohol abuse. 6. Thrombocytopenia: Secondary to alcohol abuse. Resolved. 7. Macrocytic anemia:  stable. 8. Tobacco abuse/chews: Nicotine patch. 9. ? Seborrheic dermatitis of face & scalp: Topical steroids.  Code Status: Full Family  Communication: Discussed with spouse at bedside on 5/2 Disposition Plan: SNF when medically stable-not yet stable for discharge.   Consultants:  Psychiatry-pending  Procedures:  None  Antibiotics:  None   HPI/Subjective: Confused but not agitated. Still hallucinating intermittently.  Objective: Filed Vitals:   12/31/12 1500 12/31/12 2054 01/01/13 0551 01/01/13 1334  BP: 114/79 125/88 126/88 130/87  Pulse: 132 109 97 97  Temp: 97.8 F (36.6 C) 98.8 F (37.1 C) 98.6 F (37 C) 97.9 F (36.6 C)  TempSrc: Oral Oral Oral Oral  Resp: 20 18 19 20   Height:      Weight:      SpO2: 100% 98% 98% 99%    Intake/Output Summary (Last 24 hours) at 01/01/13 1409 Last data filed at 01/01/13 1335  Gross per 24 hour  Intake    590 ml  Output    500 ml  Net     90 ml   Filed Weights   12/23/12 2200 12/27/12 0600  Weight: 78.3 kg (172 lb 9.9 oz) 76.9 kg (169 lb 8.5 oz)    Exam:   General exam:  Appears comfortable.  Respiratory system: Clear. No increased work of breathing.  Cardiovascular system: S1 & S2 heard, RRR-mildly tachycardic. No JVD, murmurs, gallops, clicks or pedal edema. Telemetry shows sinus tachycardia in the 110s-130s.   Gastrointestinal system: Abdomen is nondistended, soft and nontender. Normal bowel sounds heard.  Central nervous system: Alert and oriented x2. No focal neurological deficits.  Extremities: Symmetric 5 x 5 power. No tremors.   Skin: Patchy red scaly lesion on cheeks and nose - ? Seborrheic dermatitis-slightly better  Psychiatric: Slightly anxious but not tremulous and? Confabulation    Data Reviewed: Basic Metabolic Panel:  Recent Labs Lab 12/26/12 0340 12/27/12 0336 12/28/12 0520 12/29/12 0535  NA 136 138 138 139  K 3.5 4.5 4.4 3.7  CL 101 103 102 105  CO2 27 28 28 24   GLUCOSE 94 100* 108* 133*  BUN 4* 5* 8 6  CREATININE 0.62 0.65 0.72 0.66  CALCIUM 8.9 9.2 9.8 9.1  MG 1.7 2.2 2.1 1.9   Liver Function Tests:  Recent  Labs Lab 12/30/12 0510  AST 34  ALT 24  ALKPHOS 86  BILITOT 1.1  PROT 7.7  ALBUMIN 3.5   No results found for this basename: LIPASE, AMYLASE,  in the last 168 hours  Recent Labs Lab 12/31/12 1924  AMMONIA 39   CBC:  Recent Labs Lab 12/28/12 0520 12/28/12 1804 12/30/12 0510  WBC 5.5 6.2 7.3  HGB 13.1 11.8* 14.2  HCT 39.2 35.8* 41.8  MCV 101.0* 101.4* 99.8  PLT 160 183 252   Cardiac Enzymes: No results found for this basename: CKTOTAL, CKMB, CKMBINDEX, TROPONINI,  in the last 168 hours BNP (last 3 results) No results found for this basename: PROBNP,  in the last 8760 hours CBG:  Recent Labs Lab 12/28/12 0735 12/29/12 0742 12/29/12 1159 12/30/12 0756 12/31/12 0852  GLUCAP 103* 99 102* 95 109*    Recent Results (from the past 240 hour(s))  MRSA PCR SCREENING     Status: None   Collection Time    12/23/12 10:19 PM      Result Value Range Status   MRSA by PCR NEGATIVE  NEGATIVE Final   Comment:            The GeneXpert MRSA Assay (FDA     approved for NASAL specimens     only), is one component of a     comprehensive MRSA colonization     surveillance program. It is not     intended to diagnose MRSA     infection nor to guide or     monitor treatment for     MRSA infections.  URINE CULTURE     Status: None   Collection Time    12/26/12  6:40 AM      Result Value Range Status   Specimen Description URINE, CLEAN CATCH   Final   Special Requests NONE   Final   Culture  Setup Time 12/26/2012 11:50   Final   Colony Count 45,000 COLONIES/ML   Final   Culture     Final   Value: Multiple bacterial morphotypes present, none predominant. Suggest appropriate recollection if clinically indicated.   Report Status 12/28/2012 FINAL   Final     Studies: Ct Head Wo Contrast  12/31/2012  *RADIOLOGY REPORT*  Clinical Data: Alcohol dependence, altered mental status  CT HEAD WITHOUT CONTRAST  Technique:  Contiguous axial images were obtained from the base of the skull  through the vertex without contrast.  Comparison: None.  Findings: No evidence of parenchymal hemorrhage or extra-axial fluid collection. No mass lesion, mass effect, or midline shift.  No CT evidence of acute infarction.  Mild global cortical atrophy for age.  No ventriculomegaly.  The visualized paranasal sinuses are essentially clear. The mastoid air cells are unopacified.  No evidence of calvarial fracture.  IMPRESSION: No evidence of acute intracranial abnormality.   Original Report Authenticated By: Charline Bills, M.D.      Additional labs:   Scheduled Meds: . aspirin  325 mg Oral Daily  . betamethasone valerate lotion   Topical BID  . cloNIDine  0.1 mg Oral BID  . escitalopram  10 mg Oral Daily  . feeding supplement  237 mL Oral BID BM  .  folic acid  1 mg Oral Daily  . multivitamin with minerals  1 tablet Oral Daily  . nicotine  14 mg Transdermal Daily  . polyethylene glycol  17 g Oral Daily  . risperiDONE  0.5 mg Oral BID  . senna  2 tablet Oral Daily  . thiamine  100 mg Oral Daily   Continuous Infusions:    Principal Problem:   Alcohol withdrawal Active Problems:   Alcohol abuse   HTN (hypertension)   Hypokalemia   Transaminitis   Fall    Time spent: 35 minutes    [facility]  [facility] Pager [phone].   If 8PM-8AM, please contact night-coverage at [email or web address], password [facility] 01/01/2013, 2:09 PM  LOS: 9 days

## 2013-01-02 LAB — GLUCOSE, CAPILLARY

## 2013-01-02 MED ORDER — POLYETHYLENE GLYCOL 3350 17 G PO PACK
17.0000 g | PACK | Freq: Two times a day (BID) | ORAL | Status: DC
  Administered 2013-01-02 – 2013-01-03 (×2): 17 g via ORAL
  Filled 2013-01-02 (×5): qty 1

## 2013-01-02 MED ORDER — BISACODYL 10 MG RE SUPP
10.0000 mg | Freq: Once | RECTAL | Status: DC
  Filled 2013-01-02: qty 1

## 2013-01-02 MED ORDER — SENNA 8.6 MG PO TABS
2.0000 | ORAL_TABLET | Freq: Two times a day (BID) | ORAL | Status: DC
  Administered 2013-01-02 – 2013-01-03 (×3): 17.2 mg via ORAL
  Filled 2013-01-02: qty 2
  Filled 2013-01-02: qty 1
  Filled 2013-01-02: qty 2

## 2013-01-02 MED ORDER — DOCUSATE SODIUM 100 MG PO CAPS
100.0000 mg | ORAL_CAPSULE | Freq: Two times a day (BID) | ORAL | Status: DC
  Administered 2013-01-02 – 2013-01-04 (×5): 100 mg via ORAL
  Filled 2013-01-02 (×6): qty 1

## 2013-01-02 NOTE — Progress Notes (Signed)
[facility] PROGRESS NOTE  Matthew Costa ZOX:[id number] DOB: 1966-08-26 DOA: 12/23/2012 PCP: [name], MD  Brief narrative 47 year old male patient with history of alcohol dependence and HTN was transferred from [name] to the [facility] ED for evaluation and management of alcohol withdrawal DTs. He was admitted to step down unit and initially required Precedex drip. Once he is stabilized, he was transferred to telemetry. Patient was briefly hypotensive on 4/29 and responded well to IV fluids. Continues to improve.  Assessment/Plan: 1. Alcohol dependence/alcohol withdrawal DT's/Encephalopathy: As indicated above, patient was initially admitted to step down unit and started on Precedex drip on 12/24/12, Librium and vitamins. Once he stabilized, he was transferred to telemetry. Despite treatment for alcohol withdrawal for almost a week, patient has intermittent hallucinations, agitation and confusion. Per discussion with spouse, patient has been having periodic lapses in memory and? Confabulation for some time. Sitter for safety in place. Negative urine microscopy (rpt), CT head, ammonia level, TSH, B12 level and RPR. Psychiatry consulted-suggest improving DTs, alcoholic psychosis and depression-changed Librium to when necessary, DC'd Ativan, started Lexapro and Risperdal. Monitor. QTC on 4/28:457 ms. No significant change compared to 5/3. Patient still having some auditory hallucinations. He is not agitated but tries to keep getting out of bed. 2. Fall on 12/26/12, in step down unit with soft tissue injury of left knee: Patient denies pain. No acute findings on x-rays. 3. Hypertension: Patient was hypotensive on 4/29 which is resolved with IV fluids. Antihypertensives were held-an low dose clonidine. Controlled 4. Hypokalemia: Repleted 5. Transaminitis: Secondary to alcohol abuse. 6. Thrombocytopenia: Secondary to alcohol abuse. Resolved. 7. Macrocytic anemia:   stable. 8. Tobacco abuse/chews: Nicotine patch. 9. ? Seborrheic dermatitis of face & scalp: Topical steroids. Seems to be improving.  Code Status: Full Family Communication: Discussed with spouse at bedside on 5/2 Disposition Plan: SNF when medically stable-not yet stable for discharge.   Consultants:  Psychiatry-pending  Procedures:  None  Antibiotics:  None   HPI/Subjective: Confused but not agitated. Auditory hallucinations, per nursing.  Objective: Filed Vitals:   01/01/13 0551 01/01/13 1334 01/01/13 2119 01/02/13 0607  BP: 126/88 130/87 117/85 118/77  Pulse: 97 97 105 97  Temp: 98.6 F (37 C) 97.9 F (36.6 C) 97.5 F (36.4 C) 97.6 F (36.4 C)  TempSrc: Oral Oral Oral Oral  Resp: 19 20 18 20   Height:      Weight:      SpO2: 98% 99% 100% 99%    Intake/Output Summary (Last 24 hours) at 01/02/13 0756 Last data filed at 01/02/13 0743  Gross per 24 hour  Intake    890 ml  Output    900 ml  Net    -10 ml   Filed Weights   12/23/12 2200 12/27/12 0600  Weight: 78.3 kg (172 lb 9.9 oz) 76.9 kg (169 lb 8.5 oz)    Exam:   General exam:  Appears comfortable.  Respiratory system: Clear. No increased work of breathing.  Cardiovascular system: S1 & S2 heard, RRR-mildly tachycardic. No JVD, murmurs, gallops, clicks or pedal edema. Telemetry shows sinus tachycardia in the 110s-130s-mostly with activity.   Gastrointestinal system: Abdomen is nondistended, soft and nontender. Normal bowel sounds heard.  Central nervous system: Alert and oriented x2. No focal neurological deficits.  Extremities: Symmetric 5 x 5 power. No tremors.   Skin: Patchy red scaly lesion on cheeks and nose - ? Seborrheic dermatitis-slightly better  Psychiatric: Slightly anxious but not tremulous and? Confabulation    Data  Reviewed: Basic Metabolic Panel:  Recent Labs Lab 12/27/12 0336 12/28/12 0520 12/29/12 0535  NA 138 138 139  K 4.5 4.4 3.7  CL 103 102 105  CO2 28 28 24    GLUCOSE 100* 108* 133*  BUN 5* 8 6  CREATININE 0.65 0.72 0.66  CALCIUM 9.2 9.8 9.1  MG 2.2 2.1 1.9   Liver Function Tests:  Recent Labs Lab 12/30/12 0510  AST 34  ALT 24  ALKPHOS 86  BILITOT 1.1  PROT 7.7  ALBUMIN 3.5   No results found for this basename: LIPASE, AMYLASE,  in the last 168 hours  Recent Labs Lab 12/31/12 1924  AMMONIA 39   CBC:  Recent Labs Lab 12/28/12 0520 12/28/12 1804 12/30/12 0510  WBC 5.5 6.2 7.3  HGB 13.1 11.8* 14.2  HCT 39.2 35.8* 41.8  MCV 101.0* 101.4* 99.8  PLT 160 183 252   Cardiac Enzymes: No results found for this basename: CKTOTAL, CKMB, CKMBINDEX, TROPONINI,  in the last 168 hours BNP (last 3 results) No results found for this basename: PROBNP,  in the last 8760 hours CBG:  Recent Labs Lab 12/29/12 0742 12/29/12 1159 12/30/12 0756 12/31/12 0852 01/02/13 0703  GLUCAP 99 102* 95 109* 99    Recent Results (from the past 240 hour(s))  MRSA PCR SCREENING     Status: None   Collection Time    12/23/12 10:19 PM      Result Value Range Status   MRSA by PCR NEGATIVE  NEGATIVE Final   Comment:            The GeneXpert MRSA Assay (FDA     approved for NASAL specimens     only), is one component of a     comprehensive MRSA colonization     surveillance program. It is not     intended to diagnose MRSA     infection nor to guide or     monitor treatment for     MRSA infections.  URINE CULTURE     Status: None   Collection Time    12/26/12  6:40 AM      Result Value Range Status   Specimen Description URINE, CLEAN CATCH   Final   Special Requests NONE   Final   Culture  Setup Time 12/26/2012 11:50   Final   Colony Count 45,000 COLONIES/ML   Final   Culture     Final   Value: Multiple bacterial morphotypes present, none predominant. Suggest appropriate recollection if clinically indicated.   Report Status 12/28/2012 FINAL   Final     Studies: Ct Head Wo Contrast  12/31/2012  *RADIOLOGY REPORT*  Clinical Data:  Alcohol dependence, altered mental status  CT HEAD WITHOUT CONTRAST  Technique:  Contiguous axial images were obtained from the base of the skull through the vertex without contrast.  Comparison: None.  Findings: No evidence of parenchymal hemorrhage or extra-axial fluid collection. No mass lesion, mass effect, or midline shift.  No CT evidence of acute infarction.  Mild global cortical atrophy for age.  No ventriculomegaly.  The visualized paranasal sinuses are essentially clear. The mastoid air cells are unopacified.  No evidence of calvarial fracture.  IMPRESSION: No evidence of acute intracranial abnormality.   Original Report Authenticated By: Charline Bills, M.D.      Additional labs:   Scheduled Meds: . aspirin  325 mg Oral Daily  . betamethasone valerate lotion   Topical BID  . cloNIDine  0.1 mg Oral BID  .  escitalopram  10 mg Oral Daily  . feeding supplement  237 mL Oral BID BM  . folic acid  1 mg Oral Daily  . multivitamin with minerals  1 tablet Oral Daily  . nicotine  14 mg Transdermal Daily  . polyethylene glycol  17 g Oral Daily  . risperiDONE  0.5 mg Oral BID  . senna  2 tablet Oral Daily  . thiamine  100 mg Oral Daily   Continuous Infusions:    Principal Problem:   Alcohol withdrawal Active Problems:   Alcohol abuse   HTN (hypertension)   Hypokalemia   Transaminitis   Fall    Time spent: 35 minutes    [facility]  [facility] Pager [phone].   If 8PM-8AM, please contact night-coverage at [email or web address], password [facility] 01/02/2013, 7:56 AM  LOS: 10 days

## 2013-01-02 NOTE — Progress Notes (Signed)
Pt c/o constipation. Will pass to day nurse for rounding MD. Seems as if pt has not had BM in several days.

## 2013-01-02 NOTE — Progress Notes (Signed)
Pt has been refusing & uncooperative on Dulcolax supp. Will try again this evening.

## 2013-01-03 DIAGNOSIS — F1020 Alcohol dependence, uncomplicated: Secondary | ICD-10-CM

## 2013-01-03 DIAGNOSIS — R404 Transient alteration of awareness: Secondary | ICD-10-CM

## 2013-01-03 NOTE — Progress Notes (Signed)
Physical Therapy Treatment Patient Details Name: Parris Cudworth MRN: [id number] DOB: 04-15-1966 Today's Date: 01/03/2013 Time: 4098-1191 PT Time Calculation (min): 24 min  PT Assessment / Plan / Recommendation Comments on Treatment Session  Pt in bed with sitter in room.  MAX encouragement to participate.  Once up pt was more agreeable.  Progressing but still VERY UNSTEADY.  Amb in hallway with the RW then w/o.  No difference.  PT present with a very unsteady/drunken gait along with delayed corrective response.  Near fall when amb around the bed and pt reached for his photo books.  Therapist recovered with use of the gait belt.  Poor safety cognition.     Follow Up Recommendations  SNF;Supervision/Assistance - 24 hour     Does the patient have the potential to tolerate intense rehabilitation     Barriers to Discharge        Equipment Recommendations  Rolling walker with 5" wheels    Recommendations for Other Services    Frequency Min 3X/week   Plan Discharge plan remains appropriate;Frequency remains appropriate    Precautions / Restrictions Precautions Precautions: Fall Precaution Comments: ETOH abuse   Pertinent Vitals/Pain No c/o pain    Mobility  Bed Mobility Bed Mobility: Supine to Sit;Sit to Supine Supine to Sit: 5: Supervision Sit to Supine: 5: Supervision Details for Bed Mobility Assistance: increased time Transfers Transfers: Sit to Stand;Stand to Sit Sit to Stand: 5: Supervision;4: Min guard Stand to Sit: 5: Supervision;4: Min guard Details for Transfer Assistance: unsteady/slightly groggy/delayed response Ambulation/Gait Ambulation/Gait Assistance: 4: Min guard;4: Min assist Ambulation Distance (Feet): 350 Feet Assistive device: Rolling walker;None Ambulation/Gait Assistance Details: still unsteady druken gait with delayed corrective response and staggered.  Amb with RW and w/o, gait same.  RW tends to get in his way and shows poor control/use.  Amb in  hallway and around room.  Near fall when amb around bed as pt was reaching for his photo books.  Therapist recovered by use of gait belt. Gait Pattern: Step-through pattern;Decreased stride length;Narrow base of support;Trunk flexed Gait velocity: decreased     PT Goals                                            Progressing slolwy    Visit Information  Last PT Received On: 01/03/13 Assistance Needed: +1    Subjective Data  Patient Stated Goal: home with the family   Cognition    impaired   Balance   poor  End of Session PT - End of Session Equipment Utilized During Treatment: Gait belt Activity Tolerance: Treatment limited secondary to medical complications (Comment) Patient left: in bed;with bed alarm set;with family/visitor present Psychiatrist in room)   Felecia Shelling  PTA [facility]  Acute  Rehab Pager      [phone]

## 2013-01-03 NOTE — Progress Notes (Signed)
[facility] PROGRESS NOTE  Matthew Costa BJY:[id number] DOB: [birth year] DOA: 12/23/2012 PCP: [name], MD  Brief narrative 47 year old male patient with history of alcohol dependence and HTN was transferred from [name] to the [facility] ED for evaluation and management of alcohol withdrawal DTs. He was admitted to step down unit and initially required Precedex drip. Once he is stabilized, he was transferred to telemetry. Patient was briefly hypotensive on 4/29 and responded well to IV fluids. Continues to improve.  Assessment/Plan: 1. Alcohol dependence/alcohol withdrawal DT's/Encephalopathy: As indicated above, patient was initially admitted to step down unit and started on Precedex drip on 12/24/12, Librium and vitamins. Once he stabilized, he was transferred to telemetry. Despite treatment for alcohol withdrawal for almost a week, patient has intermittent hallucinations, agitation and confusion. Per discussion with spouse, patient has been having periodic lapses in memory and? Confabulation for some time. Sitter for safety in place. Negative urine microscopy (rpt), CT head, ammonia level, TSH, B12 level and RPR. Psychiatry consulted-suggest improving DTs, alcoholic psychosis and depression-changed Librium to when necessary, DC'd Ativan, started Lexapro and Risperdal. Monitor. Overall seems to be improving slowly. Psychiatry followup appreciated. 2. Fall on 12/26/12, in step down unit with soft tissue injury of left knee: Patient denies pain. No acute findings on x-rays. 3. Hypertension: Patient was hypotensive on 4/29 which is resolved with IV fluids. Antihypertensives were held-an low dose clonidine. Controlled 4. Hypokalemia: Repleted 5. Transaminitis: Secondary to alcohol abuse. 6. Thrombocytopenia: Secondary to alcohol abuse. Resolved. 7. Macrocytic anemia:  stable. 8. Tobacco abuse/chews: Nicotine patch. 9. ? Seborrheic dermatitis of face & scalp: Topical  steroids. Seems to be improving.  Code Status: Full Family Communication: Discussed with spouse at bedside on 5/2 Disposition Plan: Possible discharge to SNF on 5/6.   Consultants:  Psychiatry  Procedures:  None  Antibiotics:  None   HPI/Subjective: Seems more coherent. Seen ambulating with assistance-slightly unsteady. Denies complaints.  Objective: Filed Vitals:   01/02/13 1820 01/02/13 2201 01/03/13 0653 01/03/13 1040  BP: 122/84 99/84 119/74 119/75  Pulse: 95 116 92   Temp: 98.1 F (36.7 C) 97.6 F (36.4 C) 98 F (36.7 C)   TempSrc: Axillary Oral Axillary   Resp: 19 20 18    Height:      Weight:      SpO2: 100% 100% 99%     Intake/Output Summary (Last 24 hours) at 01/03/13 1714 Last data filed at 01/03/13 1410  Gross per 24 hour  Intake   2540 ml  Output   2000 ml  Net    540 ml   Filed Weights   12/23/12 2200 12/27/12 0600  Weight: 78.3 kg (172 lb 9.9 oz) 76.9 kg (169 lb 8.5 oz)    Exam:   General exam:  Appears comfortable.  Respiratory system: Clear. No increased work of breathing.  Cardiovascular system: S1 & S2 heard, RRR-mildly tachycardic. No JVD, murmurs, gallops, clicks or pedal edema. Telemetry shows sinus rhythm in the 90s.  Gastrointestinal system: Abdomen is nondistended, soft and nontender. Normal bowel sounds heard.  Central nervous system: Alert and oriented x3. No focal neurological deficits.  Extremities: Symmetric 5 x 5 power. No tremors.   Skin: Patchy red scaly lesion on cheeks and nose - ? Seborrheic dermatitis-appears much improved  Psychiatric: Not anxious or tremulous. Pleasant and cooperative.    Data Reviewed: Basic Metabolic Panel:  Recent Labs Lab 12/28/12 0520 12/29/12 0535  NA 138 139  K 4.4 3.7  CL 102 105  CO2  28 24  GLUCOSE 108* 133*  BUN 8 6  CREATININE 0.72 0.66  CALCIUM 9.8 9.1  MG 2.1 1.9   Liver Function Tests:  Recent Labs Lab 12/30/12 0510  AST 34  ALT 24  ALKPHOS 86  BILITOT 1.1   PROT 7.7  ALBUMIN 3.5   No results found for this basename: LIPASE, AMYLASE,  in the last 168 hours  Recent Labs Lab 12/31/12 1924  AMMONIA 39   CBC:  Recent Labs Lab 12/28/12 0520 12/28/12 1804 12/30/12 0510  WBC 5.5 6.2 7.3  HGB 13.1 11.8* 14.2  HCT 39.2 35.8* 41.8  MCV 101.0* 101.4* 99.8  PLT 160 183 252   Cardiac Enzymes: No results found for this basename: CKTOTAL, CKMB, CKMBINDEX, TROPONINI,  in the last 168 hours BNP (last 3 results) No results found for this basename: PROBNP,  in the last 8760 hours CBG:  Recent Labs Lab 12/29/12 1159 12/30/12 0756 12/31/12 0852 01/02/13 0703 01/03/13 0733  GLUCAP 102* 95 109* 99 102*    Recent Results (from the past 240 hour(s))  URINE CULTURE     Status: None   Collection Time    12/26/12  6:40 AM      Result Value Range Status   Specimen Description URINE, CLEAN CATCH   Final   Special Requests NONE   Final   Culture  Setup Time 12/26/2012 11:50   Final   Colony Count 45,000 COLONIES/ML   Final   Culture     Final   Value: Multiple bacterial morphotypes present, none predominant. Suggest appropriate recollection if clinically indicated.   Report Status 12/28/2012 FINAL   Final     Studies: No results found.   Additional labs:   Scheduled Meds: . aspirin  325 mg Oral Daily  . betamethasone valerate lotion   Topical BID  . bisacodyl  10 mg Rectal Once  . cloNIDine  0.1 mg Oral BID  . docusate sodium  100 mg Oral BID  . escitalopram  10 mg Oral Daily  . feeding supplement  237 mL Oral BID BM  . folic acid  1 mg Oral Daily  . multivitamin with minerals  1 tablet Oral Daily  . nicotine  14 mg Transdermal Daily  . polyethylene glycol  17 g Oral BID  . risperiDONE  0.5 mg Oral BID  . senna  2 tablet Oral BID  . thiamine  100 mg Oral Daily   Continuous Infusions:    Principal Problem:   Alcohol withdrawal Active Problems:   Alcohol abuse   HTN (hypertension)   Hypokalemia   Transaminitis    Fall    Time spent: 25 minutes    [facility]  [facility] Pager [phone].   If 8PM-8AM, please contact night-coverage at [email or web address], password [facility] 01/03/2013, 5:14 PM  LOS: 11 days

## 2013-01-03 NOTE — Progress Notes (Signed)
Clinical Social Work Department CLINICAL SOCIAL WORK PSYCHIATRY SERVICE LINE ASSESSMENT 01/03/2013  Patient:  Matthew Costa  Account:  [id number]  Admit Date:  12/23/2012  Clinical Social Worker:  Unk Lightning, LCSW  Date/Time:  01/03/2013 10:30 AM Referred by:  Physician  Date referred:  01/03/2013 Reason for Referral  Behavioral Health Issues  Substance Abuse   Presenting Symptoms/Problems (In the person's/family's own words):   Patient admitted due to alcohol withdrawal.   Abuse/Neglect/Trauma History (check all that apply)  Denies history   Abuse/Neglect/Trauma Comments:   Psychiatric History (check all that apply)  Outpatient treatment   Psychiatric medications:  Lexapro 10mg   Risperdal 0.5mg    Current Mental Health Hospitalizations/Previous Mental Health History:   Patient reports that he feels alcohol use has contributed to depression. Patient feels that due to his heavy drinking that he feels depressed. Patient reports he has tried taking medication but no further treatment.   Current provider:   Patient receiving treatment at [facility] and Date:   [place], [place]   Current Medications:   chlordiazePOXIDE, ondansetron (ZOFRAN) IV            . aspirin  325 mg Oral Daily  . betamethasone valerate lotion   Topical BID  . bisacodyl  10 mg Rectal Once  . cloNIDine  0.1 mg Oral BID  . docusate sodium  100 mg Oral BID  . escitalopram  10 mg Oral Daily  . feeding supplement  237 mL Oral BID BM  . folic acid  1 mg Oral Daily  . multivitamin with minerals  1 tablet Oral Daily  . nicotine  14 mg Transdermal Daily  . polyethylene glycol  17 g Oral BID  . risperiDONE  0.5 mg Oral BID  . senna  2 tablet Oral BID  . thiamine  100 mg Oral Daily   Previous Impatient Admission/Date/Reason:   Treatment at [facility] / Current Symptoms    Suicide/Self Harm  None reported   Suicide attempt in the past:   Patient denies any SI or  HI. Patient denies any suicide attempts in the past.   Other harmful behavior:   N/A   Psychotic/Dissociative Symptoms  Visual Hallucinations   Other Psychotic/Dissociative Symptoms:   Patient reports no current hallucinations but reports when he was first admitted and going through withdrawals he was hallucinating that his dog and cat were laying on his shoulders. Patient acknowledges that this was not real and was able to remind himself that he was at the hospital.    Attention/Behavioral Symptoms  Within Normal Limits   Other Attention / Behavioral Symptoms:    Cognitive Impairment  Within Normal Limits   Other Cognitive Impairment:    Mood and Adjustment  Flat    Stress, Anxiety, Trauma, Any Recent Loss/Stressor  None reported   Anxiety (frequency):   N/A   Phobia (specify):   N/A   Compulsive behavior (specify):   N/A   Obsessive behavior (specify):   N/A   Other:   N/A   Substance Abuse/Use  Current substance use  Substance abuse treatment needed   SBIRT completed (please refer for detailed history):  Y  Self-reported substance use:   Patient reports that he drinks alcohol on a daily basis and drinks about a pint of liquor a day. Patient reports that he went to [facility] in order to check in 28 day program. Patient reports he was only there 1 day before having to  come to the hospital.   Urinary Drug Screen Completed:  Y Alcohol level:   <11    Environmental/Housing/Living Arrangement  Stable housing   Who is in the home:   Wife and two children   Emergency contact:  Consulting civil engineer   Patient's Strengths and Goals (patient's own words):   Patient reports he is agreeable to treatment and already enrolled at [facility].   Clinical Social Worker's Interpretive Summary:   CSW received referral to assess patient and to complete psychosocial assessment. CSW met with patient at bedside. Patient's uncle was  present and patient agreeable to family involvement during assessment.    CSW introduced myself and explained role. Patient is married and has two children aged 2 and 4. Patient reports that he decided he needed treatment for alcohol use and went to [facility]. Patient reports he was admitted to the hospital after only staying at facility for one day.    Patient reports he was drinking on a daily basis and was agreeable to SBIRT. SBIRT scores conclude that patient would benefit from treatment. Patient reports once he regains his strength he would like to return to [facility] to complete treatment. Patient reports that he wants to remain sober to be a better father to his children. Patient reports a strong informal support system and reports that family has come to stay with his wife and children during treatment.    Patient and CSW discussed patient's depression and anxiety related to alcohol use. Patient reports that he would feel depressed and guilty after drinking alcohol. Patient reports that he did not feel his alcohol intake increased due to depression and felt substance use and MH concerns were unrelated.    Patient reports no current DT symptoms. Patient denies any AH or VH at this time. Patient reports he feels well enough to DC in order to prepare for treatment.    CSW discussed case with attending MD who reports that he desired psych MD to see patient once again in order to review medications. CSW will staff case with psych MD and will follow any recommendations provided.    Per chart review, unit CSW is assisting with SNF placement.   Disposition:  Recommend Psych CSW continuing to support while in hospital

## 2013-01-03 NOTE — Progress Notes (Signed)
Patient alert with periods of hallucination/confusion. Incontinent of bowel/urine, needing sitter, because he continue to get up without assist and still unsteady, and impulsive. PRN librium given. Will continue to assess patient.

## 2013-01-03 NOTE — Consult Note (Signed)
Reason for Consult:Alcohol withdrawal and hallucination. Referring Physician: Dr. Charlotte Sanes Zielke is an 47 y.o. male. Was seen by a psychiatrist before.  Patient is taking Risperdal and Lexapro.  Patient continues to have episodic hallucinations confusion and confabulation.  Overall he is improved and less intense withdrawal symptoms.  The patient is more vocal but asper staff he has confusion in the daytime.  He has no education or anger however he appears anxious restless.  He does not remember his psychiatric medication feel that he is taking medication is helping his anxiety and tremors.  As per staff he sleeps okay.  His tremors are less intense and less frequent from the past.  Patient also admitted that his hallucinations are less intense from the past.  Patient denies any suicidal thinking homicidal thinking.  He denies any auditory hallucination however admitted sometimes seeing shadows, his cat and belongings.  Patient is not having side effects of Risperdal and Lexapro at this time.   MSE: Patient was in his bed, and his personal hygiene is poor.  His speech is soft .  However at times confused.  His thought process is slow .  He has some thought blocking but denies any auditory hallucinations.  He denies any active or passive suicidal or homicidal thoughts.  He has mild tremors and shakes.  He is alert and oriented x2 .  There is no flight of ideas or any loose association.  His insight judgment and impulse control is fair.    Past Medical History  Diagnosis Date  . Hypertension     Past Surgical History  Procedure Laterality Date  . Cardiac catheterization      History reviewed. No pertinent family history.  Social History:  reports that he has quit smoking. He has never used smokeless tobacco. He reports that  drinks alcohol. He reports that he does not use illicit drugs.  Allergies: No Known Allergies  Current facility-administered medications:aspirin tablet 325 mg,  325 mg, Oral, Daily, Dorothea Ogle, MD, 325 mg at 01/03/13 1042;  betamethasone valerate lotion (VALISONE) 0.1 %, , Topical, BID, Elease Etienne, MD;  bisacodyl (DULCOLAX) suppository 10 mg, 10 mg, Rectal, Once, Elease Etienne, MD;  chlordiazePOXIDE (LIBRIUM) capsule 25 mg, 25 mg, Oral, QID PRN, Nehemiah Settle, MD, 25 mg at 01/02/13 2122 cloNIDine (CATAPRES) tablet 0.1 mg, 0.1 mg, Oral, BID, Elease Etienne, MD, 0.1 mg at 01/03/13 1041;  docusate sodium (COLACE) capsule 100 mg, 100 mg, Oral, BID, Elease Etienne, MD, 100 mg at 01/03/13 1041;  escitalopram (LEXAPRO) tablet 10 mg, 10 mg, Oral, Daily, Nehemiah Settle, MD, 10 mg at 01/03/13 1042;  feeding supplement (ENSURE COMPLETE) liquid 237 mL, 237 mL, Oral, BID BM, Lorraine Lax, RD, 237 mL at 01/03/13 1517 folic acid (FOLVITE) tablet 1 mg, 1 mg, Oral, Daily, Leda Gauze, NP, 1 mg at 01/03/13 1041;  multivitamin with minerals tablet 1 tablet, 1 tablet, Oral, Daily, Leda Gauze, NP, 1 tablet at 01/03/13 1041;  nicotine (NICODERM CQ - dosed in mg/24 hours) patch 14 mg, 14 mg, Transdermal, Daily, Elease Etienne, MD, 14 mg at 01/03/13 1040;  ondansetron (ZOFRAN) injection 4 mg, 4 mg, Intravenous, Q6H PRN, Dorothea Ogle, MD polyethylene glycol (MIRALAX / GLYCOLAX) packet 17 g, 17 g, Oral, BID, Elease Etienne, MD, 17 g at 01/03/13 1042;  risperiDONE (RISPERDAL) tablet 0.5 mg, 0.5 mg, Oral, BID, Nehemiah Settle, MD, 0.5 mg at 01/03/13 1041;  senna (SENOKOT)  tablet 17.2 mg, 2 tablet, Oral, BID, Elease Etienne, MD, 17.2 mg at 01/03/13 1041;  thiamine (VITAMIN B-1) tablet 100 mg, 100 mg, Oral, Daily, Leda Gauze, NP, 100 mg at 01/03/13 1041  Results for orders placed during the hospital encounter of 12/23/12 (from the past 48 hour(s))  GLUCOSE, CAPILLARY     Status: None   Collection Time    01/02/13  7:03 AM      Result Value Range   Glucose-Capillary 99  70 - 99 mg/dL   Comment 1 Notify RN      Comment 2 Documented in Chart    GLUCOSE, CAPILLARY     Status: Abnormal   Collection Time    01/03/13  7:33 AM      Result Value Range   Glucose-Capillary 102 (*) 70 - 99 mg/dL    No results found.  Positive for depression and excessive alcohol consumption Blood pressure 119/75, pulse 92, temperature 98 F (36.7 C), temperature source Axillary, resp. rate 18, height 6' (1.829 m), weight 76.9 kg (169 lb 8.5 oz), SpO2 99.00%.   Assessment/Plan: Delerium Tremens - getting better Alcohol dependence and withdrawal syndrome  Recommendation:  The patient has been doing better from the past.  However he still has hallucinations and confabulation.  Continue detox. Continue Risperidal and lexapro at present dose. Patient requires more stabalization. C/L to follow in AM. Consider prn Risperdal for acute psychosis and hallucination.Upon discharge patient will require follow up appointment.  Tracer Gutridge T. 01/03/2013, 4:37 PM

## 2013-01-04 MED ORDER — ADULT MULTIVITAMIN W/MINERALS CH: 1.0000 | ORAL_TABLET | Freq: Every day | ORAL | Status: DC

## 2013-01-04 MED ORDER — THIAMINE HCL 100 MG PO TABS: 100.0000 mg | ORAL_TABLET | Freq: Every day | ORAL | Status: DC

## 2013-01-04 MED ORDER — ATORVASTATIN CALCIUM 10 MG PO TABS: 10.0000 mg | ORAL_TABLET | Freq: Every day | ORAL | Status: DC

## 2013-01-04 MED ORDER — DSS 100 MG PO CAPS: 100.0000 mg | ORAL_CAPSULE | Freq: Two times a day (BID) | ORAL | Status: DC

## 2013-01-04 MED ORDER — RISPERIDONE 0.5 MG PO TABS: 0.5000 mg | ORAL_TABLET | Freq: Two times a day (BID) | ORAL | Status: DC

## 2013-01-04 MED ORDER — CLONIDINE HCL 0.1 MG PO TABS: 0.1000 mg | ORAL_TABLET | Freq: Two times a day (BID) | ORAL | Status: DC

## 2013-01-04 MED ORDER — NICOTINE 14 MG/24HR TD PT24: 1.0000 | MEDICATED_PATCH | Freq: Every day | TRANSDERMAL | Status: DC

## 2013-01-04 MED ORDER — ENSURE COMPLETE PO LIQD: 237.0000 mL | Freq: Two times a day (BID) | ORAL | Status: DC

## 2013-01-04 MED ORDER — CHLORDIAZEPOXIDE HCL 25 MG PO CAPS: 25.0000 mg | ORAL_CAPSULE | Freq: Four times a day (QID) | ORAL | Status: DC | PRN

## 2013-01-04 MED ORDER — SENNA 8.6 MG PO TABS: 2.0000 | ORAL_TABLET | Freq: Two times a day (BID) | ORAL | Status: DC

## 2013-01-04 MED ORDER — ESCITALOPRAM OXALATE 10 MG PO TABS: 10.0000 mg | ORAL_TABLET | Freq: Every day | ORAL | Status: DC

## 2013-01-04 MED ORDER — FOLIC ACID 1 MG PO TABS: 1.0000 mg | ORAL_TABLET | Freq: Every day | ORAL | Status: DC

## 2013-01-04 MED ORDER — POLYETHYLENE GLYCOL 3350 17 G PO PACK: 17.0000 g | PACK | Freq: Two times a day (BID) | ORAL | Status: DC

## 2013-01-04 MED ORDER — BETAMETHASONE VALERATE 0.1 % EX LOTN: TOPICAL_LOTION | Freq: Two times a day (BID) | CUTANEOUS | Status: DC

## 2013-01-04 NOTE — Progress Notes (Signed)
Occupational Therapy Treatment Patient Details Name: Matthew Costa MRN: [id number] DOB: [birth year] Today's Date: 01/04/2013 Time: 1478-2956 OT Time Calculation (min): 10 min  OT Assessment / Plan / Recommendation Comments on Treatment Session Pt tolerated session well. Worked on [employer] safety for in room functional mobility.     Follow Up Recommendations  SNF    Barriers to Discharge       Equipment Recommendations  3 in 1 bedside comode    Recommendations for Other Services    Frequency Min 2X/week   Plan Discharge plan needs to be updated    Precautions / Restrictions Precautions Precautions: Fall        ADL  Grooming: Performed;Wash/dry hands;Minimal assistance Where Assessed - Grooming: Unsupported standing Toilet Transfer: Simulated;Minimal assistance Toilet Transfer Method:  (with walker from bathroom to recliner) Equipment Used: Rolling walker ADL Comments: Pt with sitter in room. Up to bathroom to groom at the sink. Tends to push walker too far out in front of himself. Verbal cues to stay inside of walker for safety. Pt dropped paper towel on floor and needed min assist for balance and pt was holding to counter with one hand to retrieve and throw away. Pt did state safety precaution to stand a few seconds before walking in case he gets dizzy.      OT Diagnosis:    OT Problem List:   OT Treatment Interventions:     OT Goals ADL Goals ADL Goal: Grooming - Progress: Progressing toward goals Miscellaneous OT Goals OT Goal: Miscellaneous Goal #1 - Progress: Progressing toward goals  Visit Information  Last OT Received On: 01/04/13 Assistance Needed: +1    Subjective Data      Prior Functioning       Cognition  Cognition Arousal/Alertness: Awake/alert Behavior During Therapy: Impulsive General Comments:  (pt a little impulsive; stood from bed quickly.)    Mobility  Bed Mobility Bed Mobility: Supine to Sit Supine to Sit: 5:  Supervision Transfers Transfers: Sit to Stand;Stand to Sit Sit to Stand: 4: Min guard;With upper extremity assist;From bed Stand to Sit: With upper extremity assist;4: Min guard Details for Transfer Assistance: verbal cues for hand placement    Exercises      Balance Balance Balance Assessed: Yes Dynamic Standing Balance Dynamic Standing - Level of Assistance: 4: Min assist   End of Session OT - End of Session Activity Tolerance: Patient tolerated treatment well Patient left: in chair;with call bell/phone within reach;Other (comment) (sitter)  GO     Lennox Laity [phone] 01/04/2013, 9:23 AM

## 2013-01-04 NOTE — Discharge Summary (Signed)
Physician Discharge Summary  Matthew Costa WJX:[id number] DOB: April 06, 1966 DOA: 12/23/2012  PCP: [name], MD  Admit date: 12/23/2012 Discharge date: 01/04/2013  Time spent: Greater than 30 minutes  Recommendations for Outpatient Follow-up:  1. OP Psychiatry follow up to be arranged at SNF. 2. Dr. Leeanne Rio, PCP  Discharge Diagnoses:  Principal Problem:   Alcohol withdrawal Active Problems:   Alcohol abuse   HTN (hypertension)   Hypokalemia   Transaminitis   Fall   Discharge Condition: Improved & Stable  Diet recommendation: Regular diet  Filed Weights   12/23/12 2200 12/27/12 0600  Weight: 78.3 kg (172 lb 9.9 oz) 76.9 kg (169 lb 8.5 oz)    History of present illness:  47 year old male patient with history of alcohol dependence and HTN was transferred from [facility] to the [facility] ED for evaluation and management of alcohol withdrawal DTs. He was admitted to step down unit and initially required Precedex drip. Once he is stabilized, he was transferred to telemetry.  Hospital Course:  1. Alcohol dependence/alcohol withdrawal DT's/Encephalopathy/alcoholic psychosis: Patient was initially admitted to step down unit and started on Precedex drip on 12/24/12, Librium and vitamins. Once he stabilized, he was transferred to telemetry. Despite treatment for alcohol withdrawal for almost a week, patient continued to have intermittent hallucinations, agitation and confusion. Per discussion with spouse, patient has been having periodic lapses in memory and? Confabulation for some time. Negative urine microscopy (rpt), CT head, ammonia level, TSH, B12 level and RPR. Psychiatry was consulted-who opined that his DTs were improving but they diagnosed alcoholic psychosis and depression. Psychiatry changed Librium to when necessary, DC'd Ativan, started Lexapro and Risperdal. With these measures, patient has made steady improvement. He is mostly coherent but has occasional  periods of confusion and occasional hallucinations. He does not have agitation. His impulsiveness has decreased. Per his mother, he is definitely significantly better than a few days ago.  2. Fall on 12/26/12, in step down unit with soft tissue injury of left knee: Patient denies pain. No acute findings on x-rays. Patient has been ambulating without pain. 3. Hypertension: Patient was briefly hypotensive on 4/29 which has resolved. He has been started on clonidine for hypertension which is mostly controlled. 4. Hypokalemia: Repleted 5. Transaminitis: Secondary to alcohol abuse. Resolved. 6. Thrombocytopenia: Secondary to alcohol abuse. Resolved. 7. Macrocytic anemia: stable. 8. Tobacco abuse/chews: Nicotine patch. 9. Likely Seborrheic dermatitis of face & scalp: Topical steroids. Much improved. Continue for another week and then discontinue steroids. 10. Constipation: Improved after bowel regimen.  11. Dyslipidemia: Resume Lipitor at discharge.  Procedures:  None   Consultations:  Psychiatry  Discharge Exam:  Complaints: Patient denies complaints and is eager for discharge. Per discussion with nursing, sitter and mother, patient is coherent, not agitated, occasional confusions and hallucinations but significantly improved.  Filed Vitals:   01/03/13 1040 01/03/13 2207 01/04/13 0725 01/04/13 0850  BP: 119/75 129/88 98/61   Pulse:  89 120 107  Temp:  98 F (36.7 C) 97.5 F (36.4 C)   TempSrc:  Oral Oral   Resp:  18 20   Height:      Weight:      SpO2:  95% 98%      General exam: Comfortable.  Respiratory system: Clear. No increased work of breathing.  Cardiovascular system: S1 and S2 heard, RRR. No JVD, murmurs or pedal edema. Telemetry: Sinus rhythm mostly. Occasional sinus tachycardia. QTC on monitor 420 ms.  Gastrointestinal system: Abdomen is nondistended, soft and nontender.  Normal bowel sounds heard.  Central nervous system: Alert and oriented. No focal neurological  deficits.  Extremities: Symmetric 5 x 5 power.  Discharge Instructions      Discharge Orders   Future Orders Complete By Expires     Call MD for:  As directed     Comments:      Worsening confusion, agitation or hallucinations.    Diet general  As directed     Increase activity slowly  As directed         Medication List    TAKE these medications       aspirin 325 MG tablet  Take 325 mg by mouth daily.     atorvastatin 10 MG tablet  Commonly known as:  LIPITOR  Take 1 tablet (10 mg total) by mouth daily.     betamethasone valerate lotion 0.1 %  Commonly known as:  VALISONE  Apply topically 2 (two) times daily. To facial rash.     chlordiazePOXIDE 25 MG capsule  Commonly known as:  LIBRIUM  Take 1 capsule (25 mg total) by mouth 4 (four) times daily as needed for anxiety.     cloNIDine 0.1 MG tablet  Commonly known as:  CATAPRES  Take 1 tablet (0.1 mg total) by mouth 2 (two) times daily.     DSS 100 MG Caps  Take 100 mg by mouth 2 (two) times daily.     escitalopram 10 MG tablet  Commonly known as:  LEXAPRO  Take 1 tablet (10 mg total) by mouth daily.     feeding supplement Liqd  Take 237 mLs by mouth 2 (two) times daily between meals.     folic acid 1 MG tablet  Commonly known as:  FOLVITE  Take 1 tablet (1 mg total) by mouth daily.     multivitamin with minerals Tabs  Take 1 tablet by mouth daily.     nicotine 14 mg/24hr patch  Commonly known as:  NICODERM CQ - dosed in mg/24 hours  Place 1 patch onto the skin daily.     polyethylene glycol packet  Commonly known as:  MIRALAX / GLYCOLAX  Take 17 g by mouth 2 (two) times daily.     risperiDONE 0.5 MG tablet  Commonly known as:  RISPERDAL  Take 1 tablet (0.5 mg total) by mouth 2 (two) times daily.     senna 8.6 MG Tabs  Commonly known as:  SENOKOT  Take 2 tablets (17.2 mg total) by mouth 2 (two) times daily.     thiamine 100 MG tablet  Take 1 tablet (100 mg total) by mouth daily.        Follow-up Information   Schedule an appointment as soon as possible for a visit with [name], MD.   Contact information:   [address] [place] [address] [phone]        The results of significant diagnostics from this hospitalization (including imaging, microbiology, ancillary and laboratory) are listed below for reference.    Significant Diagnostic Studies: Ct Head Wo Contrast  12/31/2012  *RADIOLOGY REPORT*  Clinical Data: Alcohol dependence, altered mental status  CT HEAD WITHOUT CONTRAST  Technique:  Contiguous axial images were obtained from the base of the skull through the vertex without contrast.  Comparison: None.  Findings: No evidence of parenchymal hemorrhage or extra-axial fluid collection. No mass lesion, mass effect, or midline shift.  No CT evidence of acute infarction.  Mild global cortical atrophy for age.  No ventriculomegaly.  The visualized paranasal sinuses are essentially clear. The mastoid air cells are unopacified.  No evidence of calvarial fracture.  IMPRESSION: No evidence of acute intracranial abnormality.   Original Report Authenticated By: Charline Bills, M.D.    Dg Chest Port 1 View  12/26/2012  *RADIOLOGY REPORT*  Clinical Data: Fever  PORTABLE CHEST - 1 VIEW  Comparison: None.  Findings: Normal cardiac silhouette and mediastinal contours. Minimal left basilar/retrocardiac linear opacities favored to represent atelectasis.  No discrete focal airspace opacity.  No definite pleural effusion or pneumothorax.  No definite evidence of pulmonary edema.  No acute osseous abnormality.  IMPRESSION: Minimal left basilar atelectasis.  No focal airspace opacities to suggest pneumonia on this AP portable examination.  Further evaluation with a PA and lateral chest radiograph may be obtained as clinically indicated.   Original Report Authenticated By: Tacey Ruiz, MD    Dg Knee Complete 4 Views Left  12/27/2012  *RADIOLOGY REPORT*  Clinical Data:  47 year old male status post fall.  Pain.  Bruising of the lateral knee.  LEFT KNEE - COMPLETE 4+ VIEW  Comparison: None.  Findings: No definite joint effusion.  Patella intact. Bone mineralization is within normal limits.  Joint spaces relatively preserved.  Curvilinear calcification along the medial proximal femoral condyle appears to be degenerative and likely related to the medial ligamentous structures.  No acute fracture identified.  IMPRESSION: No acute fracture or dislocation identified about the left knee.   Original Report Authenticated By: Erskine Speed, M.D.     Microbiology: Recent Results (from the past 240 hour(s))  URINE CULTURE     Status: None   Collection Time    12/26/12  6:40 AM      Result Value Range Status   Specimen Description URINE, CLEAN CATCH   Final   Special Requests NONE   Final   Culture  Setup Time 12/26/2012 11:50   Final   Colony Count 45,000 COLONIES/ML   Final   Culture     Final   Value: Multiple bacterial morphotypes present, none predominant. Suggest appropriate recollection if clinically indicated.   Report Status 12/28/2012 FINAL   Final     Labs: Basic Metabolic Panel:  Recent Labs Lab 12/29/12 0535  NA 139  K 3.7  CL 105  CO2 24  GLUCOSE 133*  BUN 6  CREATININE 0.66  CALCIUM 9.1  MG 1.9   Liver Function Tests:  Recent Labs Lab 12/30/12 0510  AST 34  ALT 24  ALKPHOS 86  BILITOT 1.1  PROT 7.7  ALBUMIN 3.5   No results found for this basename: LIPASE, AMYLASE,  in the last 168 hours  Recent Labs Lab 12/31/12 1924  AMMONIA 39   CBC:  Recent Labs Lab 12/28/12 1804 12/30/12 0510  WBC 6.2 7.3  HGB 11.8* 14.2  HCT 35.8* 41.8  MCV 101.4* 99.8  PLT 183 252   Cardiac Enzymes: No results found for this basename: CKTOTAL, CKMB, CKMBINDEX, TROPONINI,  in the last 168 hours BNP: BNP (last 3 results) No results found for this basename: PROBNP,  in the last 8760 hours CBG:  Recent Labs Lab 12/30/12 0756 12/31/12 0852  01/02/13 0703 01/03/13 0733 01/04/13 0754  GLUCAP 95 109* 99 102* 109*    Additional labs:  Vitamin B12: 1700  TSH: 0.503  RPR: Nonreactive  UDS: Negative  Blood alcohol level: <11  Discussed with patient's spouse on 5/5 and patient's mother on 5/6 and updated care and answered questions.  Signed:  Leighanne Adolph  [facility] 01/04/2013, 12:42 PM

## 2013-01-04 NOTE — Care Management Note (Signed)
    Page 1 of 2   01/04/2013     5:47:35 PM   CARE MANAGEMENT NOTE 01/04/2013  Patient:  Matthew Costa, Matthew Costa   Account Number:  [id number]  Date Initiated:  12/24/2012  Documentation initiated by:  DAVIS,RHONDA  Subjective/Objective Assessment:   pt with hx of etoh abuse, [facility], [facility]'s     Action[facility]Plan:   [facility]   Anticipated DC Date:  12/27/2012   Anticipated DC Plan:  IP REHAB FACILITY  In-house referral  Clinical Social Worker      DC Planning Services  NA      [facility] Choice  NA   Choice offered to / List presented to:  NA   DME arranged  NA      DME agency  NA     HH arranged  NA      HH agency  NA   Status of service:  Completed, signed off Medicare Important Message given?  NA - LOS <3 / Initial given by admissions (If response is "NO", the following Medicare IM given date fields will be blank) Date Medicare IM given:   Date Additional Medicare IM given:    Discharge Disposition:  SKILLED NURSING FACILITY  Per UR Regulation:  Reviewed for med. necessity/level of care/duration of stay  If discussed at Long Length of Stay Meetings, dates discussed:   01/04/2013    Comments:  [id number] Earlene Plater, RN, BSN, CCM:  CHART REVIEWED AND UPDATED.  Next chart review due on [id number]. NO DISCHARGE NEEDS PRESENT AT THIS TIME. CASE MANAGEMENT [phone]

## 2013-01-04 NOTE — Progress Notes (Signed)
Physical Therapy Treatment Patient Details Name: Jazen Spraggins MRN: [id number] DOB: 17-Feb-1966 Today's Date: 01/04/2013 Time: 7829-5621 PT Time Calculation (min): 20 min  PT Assessment / Plan / Recommendation Comments on Treatment Session  Pt OOB in recliner with sitter in room.  Attempted to amb however found pt to be incont of stool.  Assisted to [facility] and with hygiene.  Near fall as pt reached outside his BOS for the rail.  Still unsteady and poor safety cognition.    Follow Up Recommendations  SNF [facility])     Does the patient have the potential to tolerate intense rehabilitation     Barriers to Discharge        Equipment Recommendations       Recommendations for Other Services    Frequency     Plan Discharge plan remains appropriate;Frequency remains appropriate    Precautions / Restrictions Precautions Precautions: Fall Precaution Comments: ETOH abuse   Pertinent Vitals/Pain No c/o pain    Mobility  Bed Mobility Bed Mobility: Not assessed Details for Bed Mobility Assistance: Pt OOB in recliner Transfers Transfers: Sit to Stand;Stand to Sit Sit to Stand: 4: Min guard;4: Min assist;From chair/3-in-1;From toilet Stand to Sit: 4: Min guard;4: Min assist;To toilet Details for Transfer Assistance: verbal cues on safety and hand placement to steady self. Ambulation/Gait Ambulation/Gait Assistance: 4: Min guard;4: Min Environmental consultant (Feet): 14 Feet Assistive device: Rolling walker Ambulation/Gait Assistance Details: still unsteady/ataxic with slow corrective response.  HIGH FALL RISK. amb from recliner to BR as pt was incont of stools. Amb with RW but in bathroom pt parked it to the side then reached beyong his BOS for the rail and nearly LOB. Gait Pattern: Step-through pattern;Decreased stride length;Narrow base of support;Trunk flexed Gait velocity: decreased    PT Goals                                               progressing    Visit  Information  Last PT Received On: 01/04/13 Assistance Needed: +1    Subjective Data      Cognition    impaired   Balance   poor  End of Session PT - End of Session Equipment Utilized During Treatment: Gait belt Activity Tolerance: Treatment limited secondary to medical complications (Comment) Patient left: Other (comment) (in bathroom with sitter)   Felecia Shelling  PTA [facility]  Acute  Rehab Pager      [phone]

## 2013-01-04 NOTE — Progress Notes (Signed)
Patient is set to discharge to [facility] today. Patient's wife made aware, patient's mother at bedside also aware & will transport to facility. Discharge packet given to RN, Tess to call report. Discharge Summary also faxed to [name] per Joy @ [facility]'s request.   Clinical Social Work Department CLINICAL SOCIAL WORK PLACEMENT NOTE 01/04/2013  Patient:  Matthew Costa, Matthew Costa  Account Number:  [id number] Admit date:  12/23/2012  Clinical Social Worker:  Orpah Greek  Date/time:  12/29/2012 02:59 PM  Clinical Social Work is seeking post-discharge placement for this patient at the following level of care:   SKILLED NURSING   (*CSW will update this form in Epic as items are completed)   12/29/2012  Patient/family provided with [facility] Department of Clinical Social Work's list of facilities offering this level of care within the geographic area requested by the patient (or if unable, by the patient's family).  12/29/2012  Patient/family informed of their freedom to choose among providers that offer the needed level of care, that participate in Medicare, Medicaid or managed care program needed by the patient, have an available bed and are willing to accept the patient.  12/29/2012  Patient/family informed of [facility]' ownership interest in [facility], as well as of the fact that they are under no obligation to receive care at this facility.  PASARR submitted to EDS on 12/29/2012 PASARR number received from [employer]DS on 12/29/2012  FL2 transmitted to all facilities in geographic area requested by pt/family on  12/29/2012 FL2 transmitted to all facilities within larger geographic area on 12/29/2012  Patient informed that his/her managed care company has contracts with or will negotiate with  certain facilities, including the following:   [employer] informed of bed offers received:  12/29/2012 Patient chooses bed at [facility] Physician recommends and patient chooses bed at    Patient to be transferred to [facility] on  01/04/2013 Patient to be transferred to facility by [facility]  The following physician request were entered in Epic:   Additional Comments:   Unice Bailey, LCSW [facility] Clinical Social Worker cell #: [phone]

## 2013-01-28 ENCOUNTER — Other Ambulatory Visit: Payer: Self-pay | Admitting: Student

## 2013-01-28 ENCOUNTER — Ambulatory Visit
Admission: RE | Admit: 2013-01-28 | Discharge: 2013-01-28 | Disposition: A | Discharge status: Home / Self Care | Payer: 59 | Source: Ambulatory Visit | Attending: Student | Admitting: Student

## 2013-01-28 DIAGNOSIS — R52 Pain, unspecified: Secondary | ICD-10-CM

## 2013-02-03 ENCOUNTER — Telehealth: Payer: Self-pay | Admitting: Nurse Practitioner

## 2013-02-04 NOTE — Telephone Encounter (Signed)
Called pt - appt given for 900 am sat

## 2013-02-05 ENCOUNTER — Ambulatory Visit: Payer: Self-pay

## 2013-02-09 ENCOUNTER — Ambulatory Visit (INDEPENDENT_AMBULATORY_CARE_PROVIDER_SITE_OTHER): Payer: 59 | Admitting: Family Medicine

## 2013-02-09 ENCOUNTER — Ambulatory Visit: Payer: Self-pay | Admitting: Family Medicine

## 2013-02-09 ENCOUNTER — Encounter: Payer: Self-pay | Admitting: Family Medicine

## 2013-02-09 VITALS — BP 128/87 | HR 72 | Temp 98.3°F | Ht 71.0 in | Wt 172.2 lb

## 2013-02-09 DIAGNOSIS — M25569 Pain in unspecified knee: Secondary | ICD-10-CM

## 2013-02-09 DIAGNOSIS — M25562 Pain in left knee: Secondary | ICD-10-CM

## 2013-02-09 MED ORDER — IBUPROFEN 800 MG PO TABS: 800.0000 mg | ORAL_TABLET | Freq: Three times a day (TID) | ORAL | Status: DC | PRN

## 2013-02-09 NOTE — Patient Instructions (Signed)
Knee Pain  The knee is the complex joint between your thigh and your lower leg. It is made up of bones, tendons, ligaments, and cartilage. The bones that make up the knee are:   The femur in the thigh.   The tibia and fibula in the lower leg.   The patella or kneecap riding in the groove on the lower femur.  CAUSES   Knee pain is a common complaint with many causes. A few of these causes are:   Injury, such as:   A ruptured ligament or tendon injury.   Torn cartilage.   Medical conditions, such as:   Gout   Arthritis   Infections   Overuse, over training or overdoing a physical activity.  Knee pain can be minor or severe. Knee pain can accompany debilitating injury. Minor knee problems often respond well to self-care measures or get well on their own. More serious injuries may need medical intervention or even surgery.  SYMPTOMS  The knee is complex. Symptoms of knee problems can vary widely. Some of the problems are:   Pain with movement and weight bearing.   Swelling and tenderness.   Buckling of the knee.   Inability to straighten or extend your knee.   Your knee locks and you cannot straighten it.   Warmth and redness with pain and fever.   Deformity or dislocation of the kneecap.  DIAGNOSIS   Determining what is wrong may be very straight forward such as when there is an injury. It can also be challenging because of the complexity of the knee. Tests to make a diagnosis may include:   Your caregiver taking a history and doing a physical exam.   Routine X-rays can be used to rule out other problems. X-rays will not reveal a cartilage tear. Some injuries of the knee can be diagnosed by:   Arthroscopy a surgical technique by which a small video camera is inserted through tiny incisions on the sides of the knee. This procedure is used to examine and repair internal knee joint problems. Tiny instruments can be used during arthroscopy to repair the torn knee cartilage (meniscus).   Arthrography  is a radiology technique. A contrast liquid is directly injected into the knee joint. Internal structures of the knee joint then become visible on X-ray film.   An MRI scan is a non x-ray radiology procedure in which magnetic fields and a computer produce two- or three-dimensional images of the inside of the knee. Cartilage tears are often visible using an MRI scanner. MRI scans have largely replaced arthrography in diagnosing cartilage tears of the knee.   Blood work.   Examination of the fluid that helps to lubricate the knee joint (synovial fluid). This is done by taking a sample out using a needle and a syringe.  TREATMENT  The treatment of knee problems depends on the cause. Some of these treatments are:   Depending on the injury, proper casting, splinting, surgery or physical therapy care will be needed.   Give yourself adequate recovery time. Do not overuse your joints. If you begin to get sore during workout routines, back off. Slow down or do fewer repetitions.   For repetitive activities such as cycling or running, maintain your strength and nutrition.   Alternate muscle groups. For example if you are a weight lifter, work the upper body on one day and the lower body the next.   Either tight or weak muscles do not give the proper support for your   knee. Tight or weak muscles do not absorb the stress placed on the knee joint. Keep the muscles surrounding the knee strong.   Take care of mechanical problems.   If you have flat feet, orthotics or special shoes may help. See your caregiver if you need help.   Arch supports, sometimes with wedges on the inner or outer aspect of the heel, can help. These can shift pressure away from the side of the knee most bothered by osteoarthritis.   A brace called an "unloader" brace also may be used to help ease the pressure on the most arthritic side of the knee.   If your caregiver has prescribed crutches, braces, wraps or ice, use as directed. The acronym for  this is PRICE. This means protection, rest, ice, compression and elevation.   Nonsteroidal anti-inflammatory drugs (NSAID's), can help relieve pain. But if taken immediately after an injury, they may actually increase swelling. Take NSAID's with food in your stomach. Stop them if you develop stomach problems. Do not take these if you have a history of ulcers, stomach pain or bleeding from the bowel. Do not take without your caregiver's approval if you have problems with fluid retention, heart failure, or kidney problems.   For ongoing knee problems, physical therapy may be helpful.   Glucosamine and chondroitin are over-the-counter dietary supplements. Both may help relieve the pain of osteoarthritis in the knee. These medicines are different from the usual anti-inflammatory drugs. Glucosamine may decrease the rate of cartilage destruction.   Injections of a corticosteroid drug into your knee joint may help reduce the symptoms of an arthritis flare-up. They may provide pain relief that lasts a few months. You may have to wait a few months between injections. The injections do have a small increased risk of infection, water retention and elevated blood sugar levels.   Hyaluronic acid injected into damaged joints may ease pain and provide lubrication. These injections may work by reducing inflammation. A series of shots may give relief for as long as 6 months.   Topical painkillers. Applying certain ointments to your skin may help relieve the pain and stiffness of osteoarthritis. Ask your pharmacist for suggestions. Many over the-counter products are approved for temporary relief of arthritis pain.   In some countries, doctors often prescribe topical NSAID's for relief of chronic conditions such as arthritis and tendinitis. A review of treatment with NSAID creams found that they worked as well as oral medications but without the serious side effects.  PREVENTION   Maintain a healthy weight. Extra pounds put  more strain on your joints.   Get strong, stay limber. Weak muscles are a common cause of knee injuries. Stretching is important. Include flexibility exercises in your workouts.   Be smart about exercise. If you have osteoarthritis, chronic knee pain or recurring injuries, you may need to change the way you exercise. This does not mean you have to stop being active. If your knees ache after jogging or playing basketball, consider switching to swimming, water aerobics or other low-impact activities, at least for a few days a week. Sometimes limiting high-impact activities will provide relief.   Make sure your shoes fit well. Choose footwear that is right for your sport.   Protect your knees. Use the proper gear for knee-sensitive activities. Use kneepads when playing volleyball or laying carpet. Buckle your seat belt every time you drive. Most shattered kneecaps occur in car accidents.   Rest when you are tired.  SEEK MEDICAL CARE IF:     You have knee pain that is continual and does not seem to be getting better.   SEEK IMMEDIATE MEDICAL CARE IF:   Your knee joint feels hot to the touch and you have a high fever.  MAKE SURE YOU:    Understand these instructions.   Will watch your condition.   Will get help right away if you are not doing well or get worse.  Document Released: 06/15/2007 Document Revised: 11/10/2011 Document Reviewed: 06/15/2007  [employer] Patient Information 2014 [employer].

## 2013-02-09 NOTE — Progress Notes (Signed)
  Subjective:    Patient ID: Matthew Costa, male    DOB: 1966/04/18, 47 y.o.   MRN: [id number]  HPI This 47 y.o. male presents for evaluation of left knee pain.  Patient states he has been seen twice for this problem in the past.  He states about a month ago he fell onto his left knee sustaining an injury and bruise.  He states he had a patellar bruise and then on follow up he had xray of his knee showing a bone spur and possible bone fragment or bone spur that is under the patella.  He states he is having difficulty with ROM and general arthralgias in his left knee.        Review of Systems  Constitutional: Negative.   HENT: Negative.   Eyes: Negative.   Respiratory: Negative.   Cardiovascular: Negative.   Gastrointestinal: Negative.   Musculoskeletal: Positive for joint swelling and arthralgias.        Objective:   Physical Exam  Constitutional: He appears well-developed and well-nourished.  HENT:  Head: Normocephalic and atraumatic.  Mouth/Throat: Oropharynx is clear and moist.  Eyes: Conjunctivae are normal. Pupils are equal, round, and reactive to light.  Neck: Normal range of motion.  Cardiovascular: Normal rate and regular rhythm.   Musculoskeletal: He exhibits tenderness.  Patient is able to get up and onto the exam table without difficulty.  He c/o left patellar pain with palpation.  He c/o pain with extension of the left knee.  He has no deformity or swelling of the left knee.  He is wearing a left knee wrap which is removed for exam.          Assessment & Plan:  Knee pain, acute, left - Plan: ibuprofen (ADVIL,MOTRIN) 800 MG tablet, Ambulatory referral to Orthopedic Surgery Motrin 800mg  one po tid x 10 days #30w/1rf.  Discussed with patient to not take any Toradol that he was rx'd for pain while taking Motrin.  Explained to patient that motrin taken consistently is good agent for pain control.

## 2013-06-08 ENCOUNTER — Encounter (INDEPENDENT_AMBULATORY_CARE_PROVIDER_SITE_OTHER): Payer: Self-pay

## 2013-06-08 ENCOUNTER — Ambulatory Visit (INDEPENDENT_AMBULATORY_CARE_PROVIDER_SITE_OTHER): Payer: 59 | Admitting: General Practice

## 2013-06-08 ENCOUNTER — Encounter: Payer: Self-pay | Admitting: General Practice

## 2013-06-08 VITALS — BP 161/93 | HR 92 | Temp 99.3°F | Ht 71.0 in | Wt 170.0 lb

## 2013-06-08 DIAGNOSIS — M5431 Sciatica, right side: Secondary | ICD-10-CM

## 2013-06-08 DIAGNOSIS — M5430 Sciatica, unspecified side: Secondary | ICD-10-CM

## 2013-06-08 MED ORDER — CYCLOBENZAPRINE HCL 10 MG PO TABS: 10.0000 mg | ORAL_TABLET | Freq: Three times a day (TID) | ORAL | Status: DC | PRN

## 2013-06-08 MED ORDER — PREDNISONE (PAK) 10 MG PO TABS: ORAL_TABLET | ORAL | Status: DC

## 2013-06-08 MED ORDER — NAPROXEN 500 MG PO TABS: 500.0000 mg | ORAL_TABLET | Freq: Two times a day (BID) | ORAL | Status: DC

## 2013-06-08 MED ORDER — METHYLPREDNISOLONE ACETATE 80 MG/ML IJ SUSP
80.0000 mg | Freq: Once | INTRAMUSCULAR | Status: AC
  Administered 2013-06-08: 80 mg via INTRAMUSCULAR

## 2013-06-08 NOTE — Progress Notes (Signed)
  Subjective:    Patient ID: Matthew Costa, male    DOB: 25-Mar-1966, 47 y.o.   MRN: [id number]  Leg Pain  The incident occurred 3 to 5 days ago. The incident occurred at home. There was no injury mechanism. The pain is present in the right hip, right thigh, right knee and right toes. The quality of the pain is described as burning. The pain is at a severity of 6/10. The pain is moderate. The pain has been constant since onset. Associated symptoms include tingling. Pertinent negatives include no loss of motion, loss of sensation or muscle weakness. She reports no foreign bodies present. The symptoms are aggravated by movement. She has tried nothing for the symptoms.       Review of Systems  Constitutional: Negative for fever and chills.  Respiratory: Negative for chest tightness and shortness of breath.   Cardiovascular: Negative for chest pain and palpitations.  Musculoskeletal:       Right buttocks pain radiating down leg, with tingling in toes.  Neurological: Positive for tingling. Negative for dizziness, weakness and headaches.       Objective:   Physical Exam  Constitutional: She is oriented to person, place, and time. She appears well-developed and well-nourished.  Cardiovascular: Normal rate, regular rhythm and normal heart sounds.   Pulmonary/Chest: Effort normal and breath sounds normal.  Musculoskeletal:  Sciatica to right side  Neurological: She is alert and oriented to person, place, and time.  Skin: Skin is warm and dry.  Psychiatric: She has a normal mood and affect.          Assessment & Plan:  1. Sciatica, right  - methylPREDNISolone acetate (DEPO-MEDROL) injection 80 mg; Inject 1 mL (80 mg total) into the muscle once.  2. Sciatica of right side without back pain  - cyclobenzaprine (FLEXERIL) 10 MG tablet; Take 1 tablet (10 mg total) by mouth 3 (three) times daily as needed for muscle spasms.  Dispense: 30 tablet; Refill: 0 - predniSONE (STERAPRED  UNI-PAK) 10 MG tablet; Take as directed  Dispense: 21 tablet; Refill: 0 - naproxen (NAPROSYN) 500 MG tablet; Take 1 tablet (500 mg total) by mouth 2 (two) times daily with a meal.  Dispense: 30 tablet; Refill: 0 -Sciatica information sheets provided and discussed -RTO if symptoms worsen or unresolved, will refer to ortho -Patient verbalized understanding Coralie Keens, FNP-C

## 2013-06-08 NOTE — Patient Instructions (Signed)
Sciatica °Sciatica is pain, weakness, numbness, or tingling along the path of the sciatic nerve. The nerve starts in the lower back and runs down the back of each leg. The nerve controls the muscles in the lower leg and in the back of the knee, while also providing sensation to the back of the thigh, lower leg, and the sole of your foot. Sciatica is a symptom of another medical condition. For instance, nerve damage or certain conditions, such as a herniated disk or bone spur on the spine, pinch or put pressure on the sciatic nerve. This causes the pain, weakness, or other sensations normally associated with sciatica. Generally, sciatica only affects one side of the body. °CAUSES  °· Herniated or slipped disc. °· Degenerative disk disease. °· A pain disorder involving the narrow muscle in the buttocks (piriformis syndrome). °· Pelvic injury or fracture. °· Pregnancy. °· Tumor (rare). °SYMPTOMS  °Symptoms can vary from mild to very severe. The symptoms usually travel from the low back to the buttocks and down the back of the leg. Symptoms can include: °· Mild tingling or dull aches in the lower back, leg, or hip. °· Numbness in the back of the calf or sole of the foot. °· Burning sensations in the lower back, leg, or hip. °· Sharp pains in the lower back, leg, or hip. °· Leg weakness. °· Severe back pain inhibiting movement. °These symptoms may get worse with coughing, sneezing, laughing, or prolonged sitting or standing. Also, being overweight may worsen symptoms. °DIAGNOSIS  °Your caregiver will perform a physical exam to look for common symptoms of sciatica. He or she may ask you to do certain movements or activities that would trigger sciatic nerve pain. Other tests may be performed to find the cause of the sciatica. These may include: °· Blood tests. °· X-rays. °· Imaging tests, such as an MRI or CT scan. °TREATMENT  °Treatment is directed at the cause of the sciatic pain. Sometimes, treatment is not necessary  and the pain and discomfort goes away on its own. If treatment is needed, your caregiver may suggest: °· Over-the-counter medicines to relieve pain. °· Prescription medicines, such as anti-inflammatory medicine, muscle relaxants, or narcotics. °· Applying heat or ice to the painful area. °· Steroid injections to lessen pain, irritation, and inflammation around the nerve. °· Reducing activity during periods of pain. °· Exercising and stretching to strengthen your abdomen and improve flexibility of your spine. Your caregiver may suggest losing weight if the extra weight makes the back pain worse. °· Physical therapy. °· Surgery to eliminate what is pressing or pinching the nerve, such as a bone spur or part of a herniated disk. °HOME CARE INSTRUCTIONS  °· Only take over-the-counter or prescription medicines for pain or discomfort as directed by your caregiver. °· Apply ice to the affected area for 20 minutes, 3 4 times a day for the first 48 72 hours. Then try heat in the same way. °· Exercise, stretch, or perform your usual activities if these do not aggravate your pain. °· Attend physical therapy sessions as directed by your caregiver. °· Keep all follow-up appointments as directed by your caregiver. °· Do not wear high heels or shoes that do not provide proper support. °· Check your mattress to see if it is too soft. A firm mattress may lessen your pain and discomfort. °SEEK IMMEDIATE MEDICAL CARE IF:  °· You lose control of your bowel or bladder (incontinence). °· You have increasing weakness in the lower back,   pelvis, buttocks, or legs. °· You have redness or swelling of your back. °· You have a burning sensation when you urinate. °· You have pain that gets worse when you lie down or awakens you at night. °· Your pain is worse than you have experienced in the past. °· Your pain is lasting longer than 4 weeks. °· You are suddenly losing weight without reason. °MAKE SURE YOU: °· Understand these  instructions. °· Will watch your condition. °· Will get help right away if you are not doing well or get worse. °Document Released: 08/12/2001 Document Revised: 02/17/2012 Document Reviewed: 12/28/2011 °[employer]® Patient Information ©2014 [employer]. ° °

## 2013-06-14 ENCOUNTER — Telehealth: Payer: Self-pay | Admitting: General Practice

## 2013-06-15 ENCOUNTER — Telehealth: Payer: Self-pay

## 2013-06-15 NOTE — Telephone Encounter (Signed)
Patient calling to see about his referral  Matthew Costa it has been a week  See in your notes that you would refer to ortho if he got worse

## 2013-06-15 NOTE — Telephone Encounter (Signed)
Advise

## 2013-06-16 NOTE — Telephone Encounter (Signed)
Advise

## 2013-06-17 ENCOUNTER — Other Ambulatory Visit: Payer: Self-pay | Admitting: General Practice

## 2013-06-17 DIAGNOSIS — M549 Dorsalgia, unspecified: Secondary | ICD-10-CM

## 2013-06-17 MED ORDER — HYDROCODONE-ACETAMINOPHEN 10-325 MG PO TABS: 1.0000 | ORAL_TABLET | Freq: Three times a day (TID) | ORAL | Status: DC | PRN

## 2013-06-17 NOTE — Telephone Encounter (Signed)
Spoke with patient and referral being made. Pain med script written, patient to pick up.

## 2013-07-19 ENCOUNTER — Ambulatory Visit (INDEPENDENT_AMBULATORY_CARE_PROVIDER_SITE_OTHER): Payer: 59 | Admitting: Cardiovascular Disease

## 2013-07-19 ENCOUNTER — Encounter: Payer: Self-pay | Admitting: Cardiovascular Disease

## 2013-07-19 VITALS — BP 158/99 | HR 87 | Ht 71.0 in | Wt 171.0 lb

## 2013-07-19 DIAGNOSIS — I255 Ischemic cardiomyopathy: Secondary | ICD-10-CM

## 2013-07-19 DIAGNOSIS — E785 Hyperlipidemia, unspecified: Secondary | ICD-10-CM

## 2013-07-19 DIAGNOSIS — I2510 Atherosclerotic heart disease of native coronary artery without angina pectoris: Secondary | ICD-10-CM | POA: Insufficient documentation

## 2013-07-19 DIAGNOSIS — I2589 Other forms of chronic ischemic heart disease: Secondary | ICD-10-CM

## 2013-07-19 DIAGNOSIS — Z01818 Encounter for other preprocedural examination: Secondary | ICD-10-CM

## 2013-07-19 DIAGNOSIS — I10 Essential (primary) hypertension: Secondary | ICD-10-CM

## 2013-07-19 MED ORDER — LISINOPRIL 5 MG PO TABS: 5.0000 mg | ORAL_TABLET | Freq: Every day | ORAL | Status: DC

## 2013-07-19 MED ORDER — ATORVASTATIN CALCIUM 20 MG PO TABS: 20.0000 mg | ORAL_TABLET | Freq: Every evening | ORAL | Status: DC

## 2013-07-19 MED ORDER — METOPROLOL SUCCINATE ER 25 MG PO TB24: 25.0000 mg | ORAL_TABLET | Freq: Every day | ORAL | Status: DC

## 2013-07-19 NOTE — Progress Notes (Signed)
Patient ID: RUDRANSH BELLANCA, male   DOB: 1966/02/02, 47 y.o.   MRN: [id number]       CARDIOLOGY CONSULT NOTE  Patient ID: JACKSTON OAXACA MRN: [id number] DOB/AGE: 1966-04-09 47 y.o.  Admit date: (Not on file) Primary Physician Rudi Heap, MD  Reason for Consultation: CAD  HPI: The patient Matthew Costa") is a 47 year old male with a PMH significant for CAD, HTN, hyperlipidemia, and bronchiectasis, who reportedly had an out of hospital myocardial infarction in [place]. His only symptoms was dizziness. He had an initial positive stress test and then had a left heart catheterization. He had two-vessel disease and bypass surgery was recommended. The patient declined and drove back to [place] to see a physician at [facility] for a second opinion. Echocardiogram and stress cardiac MRI were done in 08/2012 (see results below). This was consistent with infarction in the RCA distribution with no inducible ischemia. However, there was also evidence of prior infarction in the LAD territory, but no mention made if there was any ischemia in this distribution. The patient was advised not to undergo bypass surgery.   The stress MRI revealed an EF of 45%, but the echocardiogram revealed an EF of 55-60% with regional wall motion abnormalities, and diastolic dysfunction.  He states that he has no chest pain or shortness of breath, orthopnea or PND, palpitations, dizziness, or syncope. [place] stays [place]. He is also active with his children who are 7 and 85 years old. 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  Prior to his heart attack, he had been smoking but has not smoked a cigarette for the past 7 years. He recently celebrated his sixth wedding anniversary and this is his second marriage.  He drinks about 2 glasses of wine 4 nights a week and possibly a beer once a month.  He recently sustained a lower back  injury after picking up one of his children and he is being scheduled for microlumbar decompression in the L5-S1 region.  He had been on cardiovascular medications in the past such as carvedilol, lisinopril, and Lipitor, but he said that while he didn't feel badly, he did feel differently, and thus stopped taking these medications.  SocHx: prior smoker, quit 7 yrs ago. Drinks 2 glasses of wine four nights per week. Married for 6 yrs. 2 children (ages 60 and 5). 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  FamHx: father died of an MI in his 16's.   No Known Allergies  Current Outpatient Prescriptions  Medication Sig Dispense Refill  . aspirin 325 MG tablet Take 325 mg by mouth daily.      . betamethasone valerate lotion (VALISONE) 0.1 % Apply topically 2 (two) times daily. To facial rash.  60 mL  0  . HYDROcodone-acetaminophen (NORCO) 10-325 MG per tablet Take 1 tablet by mouth every 8 (eight) hours as needed for pain.  30 tablet  0  . Multiple Vitamin (MULTIVITAMIN WITH MINERALS) TABS Take 1 tablet by mouth daily.       No current facility-administered medications for this visit.    Past Medical History  Diagnosis Date  . Hypertension   . Hyperlipidemia   . Depression     Past Surgical History  Procedure Laterality Date  . Cardiac catheterization      History   Social History  . Marital Status: Married    Spouse Name: N/A    Number  of Children: N/A  . Years of Education: N/A   Occupational History  . Not on file.   Social History Main Topics  . Smoking status: Former Games developer  . Smokeless tobacco: Current User    Types: Snuff     Comment: dips 1-2 cans per week  . Alcohol Use: Yes  . Drug Use: No  . Sexual Activity: No   Other Topics Concern  . Not on file   Social History Narrative  . No narrative on file     Family History  Problem Relation Age of Onset  . Healthy Mother   . Early death Father   . Heart disease Father      Prior to Admission  medications   Medication Sig Start Date End Date Taking? Authorizing Provider  aspirin 325 MG tablet Take 325 mg by mouth daily.   Yes Historical Provider, MD  betamethasone valerate lotion (VALISONE) 0.1 % Apply topically 2 (two) times daily. To facial rash. 01/04/13  Yes Elease Etienne, MD  HYDROcodone-acetaminophen (NORCO) 10-325 MG per tablet Take 1 tablet by mouth every 8 (eight) hours as needed for pain. 06/17/13  Yes Mae Shelda Altes, FNP  Multiple Vitamin (MULTIVITAMIN WITH MINERALS) TABS Take 1 tablet by mouth daily. 01/04/13  Yes Elease Etienne, MD     Review of systems complete and found to be negative unless listed above in HPI     Physical exam Blood pressure 158/99, pulse 87, height 5\' 11"  (1.803 m), weight 171 lb (77.565 kg). General: NAD Neck: No JVD, no thyromegaly or thyroid nodule.  Lungs: Clear to auscultation bilaterally with normal respiratory effort. CV: Nondisplaced PMI.  Heart regular S1/S2, no S3/S4, no murmur.  No peripheral edema.  No carotid bruit.  Normal pedal pulses.  Abdomen: Soft, nontender, no hepatosplenomegaly, no distention.  Skin: Intact without lesions or rashes.  Neurologic: Alert and oriented x 3.  Psych: Normal affect. Extremities: No clubbing or cyanosis.  HEENT: Normal.   Labs:   Lab Results  Component Value Date   WBC 7.3 12/30/2012   HGB 14.2 12/30/2012   HCT 41.8 12/30/2012   MCV 99.8 12/30/2012   PLT 252 12/30/2012   No results found for this basename: NA, K, CL, CO2, BUN, CREATININE, CALCIUM, LABALBU, PROT, BILITOT, ALKPHOS, ALT, AST, GLUCOSE,  in the last 168 hours No results found for this basename: CKTOTAL, CKMB, CKMBINDEX, TROPONINI    No results found for this basename: CHOL   No results found for this basename: HDL   No results found for this basename: LDLCALC   No results found for this basename: TRIG   No results found for this basename: CHOLHDL   No results found for this basename: LDLDIRECT       EKG: (12-27-12):  NSR, 89 bpm, prior inferior and anteroseptal infarct  Studies: Cardiac stress MRI (08/2012): The left ventricle is normal in size. Left ventricular systolic function is  mildly reduced. There is base and mid-inferior wall hypokinesis and basal  inferior septal wall hypokinesis at rest. Left ventricular quantitative  ejection fraction: 45%. Myocardial perfusion is normal during stress and  rest imaging. There is > 50% subendocardial delayed Gadolinium  hyperenhancement in the base to mid-inferior and inferoseptal walls  consistent with prior RCA infarction. There is < 50% delayed Gadolinium  hyperenhcancement in the mid to distal anteroseptum suggestive of prior  infarction in the LAD territory. The right ventricle is normal in size.  Right ventricular systolic function is normal. Coarsening  of interstitial  markings within bilateral lung bases suggestive of bronchiectasis. Small  left greater than right pleural effusion. IMPRESSION: Mildly decreased  LV function Prior RCA infarction with little viability Other territories  were viable No inducible ischemia    SUMMARY (ECHO 08/2012): There is no comparison study available. The left ventricular size is normal. There is normal left ventricular wall thickness. Left ventricular systolic function is normal. LV ejection fraction = 55-60%. Left ventricular filling pattern is impaired. There are regional wall motion abnormalities as specified below. The right ventricle is normal in size and function. No significant valvular abnormalities There was insufficient TR detected to calculate RV systolic pressure. There is no pericardial effusion.   ASSESSMENT AND PLAN:  1. Preoperative risk stratification in the setting of 2-vessel CAD and normal LV systolic function: It appears that he remains quite active and remains asymptomatic, specifically denying dizziness, lightheadedness, palpitations, syncope, chest pain and shortness of breath. He is  currently on aspirin 325 mg daily which I will reduce to 81 mg daily. In order to attenuate his risk for a perioperative major adverse cardiac event, I will start Toprol-XL 25 mg daily, along with lisinopril 5 mg daily and Lipitor 20 mg daily. He is at least at low to moderate risk for such an event. 2. HTN: he has reportedly been unable to tolerate carvedilol or lisinopril in the past, causing him to experience postural dizziness. I will start low-dose lisinopril 5 mg daily. Given that I'm starting Toprol-XL 25 mg daily as well, I will monitor his BP to see if any further titration is indicated. 3. Hyperlipidemia: had apparently been on statin therapy in the past. I will start low-dose Lipitor 20 mg daily. I will check LFTs in 4 weeks and repeat a lipid panel in 3 months.  Time spent: 45 minutes    Signed: Prentice Docker, M.D., F.A.C.C.  07/19/2013, 9:42 AM

## 2013-07-19 NOTE — Patient Instructions (Signed)
   Begin Toprol XL 25mg  daily  Begin Lisinopril 5mg  daily  Begin Lipitor 20mg  every evening   All new meds sent to pharmacy  Continue all other medications.   Labs for liver function - 1 month Labs for lipids (cholesterol) - 3 months (Reminder:  Nothing to eat or drink after 12 midnight prior to labs.) Will send reminder in mail when time to do labs Office will contact with results via phone or letter.   Follow up in  4 months

## 2013-07-22 ENCOUNTER — Telehealth: Payer: Self-pay | Admitting: General Practice

## 2013-07-22 NOTE — Telephone Encounter (Signed)
Aware. 

## 2013-07-22 NOTE — Telephone Encounter (Signed)
Patient should get script from ortho surgeon

## 2013-07-27 ENCOUNTER — Ambulatory Visit: Payer: 59

## 2013-07-27 ENCOUNTER — Other Ambulatory Visit: Payer: Self-pay | Admitting: Orthopedic Surgery

## 2013-08-02 ENCOUNTER — Other Ambulatory Visit: Payer: Self-pay | Admitting: Orthopedic Surgery

## 2013-08-02 NOTE — H&P (Signed)
Matthew Costa is an 47 y.o. male.   Chief Complaint: back and leg pain HPI: The patient is a 47 year old male being followed for their right-sided back pain. They are now 9 week(s) out from when symptoms began. Symptoms reported today include: pain (right low back, buttock, and leg) and numbness (feet). The patient states that they are doing poorly. Current treatment includes: home exercise program, activity modification and pain medications. The following medication has been used for pain control: Percocet and Ultram. The patient reports their current pain level to be moderate to severe. The patient presents today following MRI. The patient has reported symptom improvement with: activity modification while they have not gotten any relief of their symptoms with: conservative measures, corticosteroid use (temporary relief, few days), NSAIDs, physical therapy (HEP) or rest. He continues to note pain R lower back, into R buttock, posterolateral thigh, anterior lower leg, dorsal foot. Denies left sided buttock or leg pain. Both feet feel cold and numb and it is painful for him to wear shoes. The right foot does still feel weak. His symptoms are much better standing, worse when sitting, especially when sitting in the car. He is also more comfortable lying down flat on his back with his left leg pulled up. Refractory to dosepak, NSAIDs, HEP, activity modifications, pain medications, relative rest. Ultram does not seem to be helping. He is taking Percocet as needed for pain. He has two young children at home, 57 and 8 years old.  Past Medical History  Diagnosis Date  . Hypertension   . Hyperlipidemia   . Depression     Past Surgical History  Procedure Laterality Date  . Cardiac catheterization      Family History  Problem Relation Age of Onset  . Healthy Mother   . Early death Father   . Heart disease Father    Social History:  reports that he has quit smoking. His smokeless tobacco use includes  Snuff. He reports that he drinks alcohol. He reports that he does not use illicit drugs.  Allergies: No Known Allergies   (Not in a hospital admission)  No results found for this or any previous visit (from the past 48 hour(s)). No results found.  Review of Systems  Constitutional: Negative.   HENT: Negative.   Eyes: Negative.   Respiratory: Negative.   Cardiovascular: Negative.   Gastrointestinal: Negative.   Genitourinary: Negative.   Musculoskeletal: Positive for back pain.  Skin: Negative.   Neurological: Positive for sensory change and focal weakness.  Endo/Heme/Allergies: Negative.   Psychiatric/Behavioral: Negative.     There were no vitals taken for this visit. Physical Exam  Constitutional: He is oriented to person, place, and time. He appears well-developed and well-nourished. He appears distressed.  HENT:  Head: Normocephalic and atraumatic.  Eyes: Conjunctivae and EOM are normal. Pupils are equal, round, and reactive to light.  Neck: Normal range of motion. Neck supple.  Cardiovascular: Normal rate and regular rhythm.   Respiratory: Effort normal and breath sounds normal.  GI: Soft. Bowel sounds are normal.  Musculoskeletal:  General Mental Status - Alert. General Appearance- pleasant and In acute distress. appears uncomfortable. Note: standing, pacing Orientation- Oriented X3. Build & Nutrition- Well nourished and Well developed.   Abdomen Palpation/Percussion:Tenderness- Abdomen is non-tender to palpation. Rigidity (guarding)- Abdomen is soft.   Peripheral Vascular Lower Extremity: Palpation:Homan's sign- Bilateral- Negative (normal). Posterior tibial pulse- Bilateral- 2+. Dorsalis pedis pulse- Bilateral- 2+.   Neurologic Motor: Strength :Hip Flexion- Bilateral- 5/5.  Quadriceps- Bilateral- 5/5. Hamstrings- Bilateral- 5/5. Ankle Dorsiflexion- Bilateral- 5/5. Ankle Plantarflexion- Bilateral- 5/5. Extensor Hallucis Longus-  Bilateral- 3+/5. Sensation:Lower Extremity- Bilateral- sensation is intact to light touch in the lower extremity. Reflexes:Patellar Reflex- Bilateral- 2+. Achilles Reflex- Bilateral- 2+. Babinski- Bilateral- Babinski not present. Clonus- Bilateral- clonus not present.   Musculoskeletal Spine/Ribs/Pelvis Lumbosacral Spine:Inspection and Palpation- Tenderness- right buttock is tender to palpation. no tenderness to palpation of the lumbar spinous processes, no tenderness to palpation of the right flank, no tenderness to palpation of right flank, no tenderness to palpation of left lumbar paraspinals, no tenderness to palpation of right lumbar paraspinals, no tenderness to palpation of the left greater trochanter and no tenderness to palpation of the right greater trochanter. Swelling- none. Sensation- normal. Other characteristics- no ecchymosis, no abnormal warmth, no erythema and no evidence of cellulitis. ROM- Flexion- severely decreased range of motion and painful. Extension- mildly decreased range of motion. Testing limited- due to pain. Special Testing- Lumbar- Right seated straight leg raise positive (produces buttock, thigh, lower leg pain). Left seated straight leg raise negative. Lower Extremity Right Lower Extremity: Right Hip: ROM:- Full ROM of the hip and - pain-free. Right Knee: ROM:- Full ROM of the knee and - pain-free. Right Ankle: ROM:- Full and pain-free ROM of the ankle. Left Lower Extremity: Left Hip : ROM:- Full ROM of the hip and - pain-free. Left Knee: ROM:- Full ROM of the knee and - pain-free. Left Ankle: ROM:- Full and pain-free ROM of the ankle.   Lymphatic General Lymphatics Description- No Localized lymphadenopathy.  Neurological: He is alert and oriented to person, place, and time. He has normal reflexes.  Skin: Skin is warm and dry.  Psychiatric: He has a normal mood and affect.    xrays Lspine with mild  scoliosis. Minimal hip degenerative changes. Mild DDD L5-S1. No instability or listhesis noted.  MRI Lspine images and report reviewed today by Dr. 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 The disc also mildly displaces the right S1 nerve root.  Assessment/Plan HNP L5-S1  Pt with 5 weeks of acute onset R sided LBP with RLE radicular symptoms L5 distribution, dermatomal dysthesias, myotomal weakness, actually worsening EHL weakness since his prior visit, due to HNP L5-S1 right, refractory to prednisone dosepak, NSAIDs, pain medications, activity modifications, HEP, relative rest. We discussed relevant anatomy and his dx in detail using his MRI images, anatomic models, and illustrations. Discussed tx options in detail. Given his amount of pain and location of his HNP would not recommend formal PT as he would not be able to tolerate it. Given the location of his HNP, with foraminal stenosis at the L5 nerve root on the right at L5-S1, an ESI would likely not be beneficial for his pain at this point. Given all the above, his ongoing and worsening weakness, recommend microlumbar decompression L5-S1. We discussed the procedure itself as well as risks, complications, and alternatives including but not limited to DVT, infx, bleeding, failure of the procedure, need for secondary procedure, CSF leak, dural tear, nerve injury, anesthesia risk, even death. Discussed post-op activity limitations, spine precautions, possible need for PT, need for activity modifications to prevent reinjury or exacerbation. All questions were answered and he desires to proceed. We discussed the possibility of further progressive symptoms including foot drop. He will continue Percocet in the interim for pain and will add Neurontin at night to see if this helps  with his nerve pain. Given his prior hx of MI will obtain pre-op clearance from his primary care provider,  Helene Kelp PA-C. Clearance letter given. Once clearance is obtained, will proceed accordingly with scheduling. He plans to wait until the end of November due to his wife's busy work schedule this month, as long as his symptoms do not progress prior to that. He will call with any questions or concerns in the interim.  I had an extensive discussion of the risks and benefits of the lumbar decompression with the patient including bleeding, infection, damage to neurovascular structures, epidural fibrosis, CSF leak requiring repair. We also discussed increase in pain, adjacent segment disease, recurrent disc herniation, need for future surgery including repeat decompression and/or fusion. We also discussed risks of postoperative hematoma, paralysis, anesthetic complications including DVT, PE, death, cardiopulmonary dysfunction. In addition, the perioperative and postoperative courses were discussed in detail including the rehabilitative time and return to functional activity and work. I provided the patient with an illustrated handout and utilized the appropriate surgical models.  Plan microlumbar decompression L5-S1  Seneca Hoback M. for Dr. Shelle Iron 08/02/2013, 3:32 PM

## 2013-08-10 ENCOUNTER — Encounter (HOSPITAL_COMMUNITY): Payer: Self-pay | Admitting: Pharmacy Technician

## 2013-08-12 ENCOUNTER — Ambulatory Visit (HOSPITAL_COMMUNITY)
Admission: RE | Admit: 2013-08-12 | Discharge: 2013-08-12 | Disposition: A | Discharge status: Home / Self Care | Payer: 59 | Source: Ambulatory Visit | Attending: Orthopedic Surgery | Admitting: Orthopedic Surgery

## 2013-08-12 ENCOUNTER — Encounter (INDEPENDENT_AMBULATORY_CARE_PROVIDER_SITE_OTHER): Payer: Self-pay

## 2013-08-12 ENCOUNTER — Encounter (HOSPITAL_COMMUNITY)
Admission: RE | Admit: 2013-08-12 | Discharge: 2013-08-12 | Disposition: A | Discharge status: Home / Self Care | Payer: 59 | Source: Ambulatory Visit | Attending: Specialist | Admitting: Specialist

## 2013-08-12 ENCOUNTER — Encounter (HOSPITAL_COMMUNITY): Payer: Self-pay

## 2013-08-12 DIAGNOSIS — Z01818 Encounter for other preprocedural examination: Secondary | ICD-10-CM | POA: Insufficient documentation

## 2013-08-12 DIAGNOSIS — M79609 Pain in unspecified limb: Secondary | ICD-10-CM | POA: Insufficient documentation

## 2013-08-12 DIAGNOSIS — Z01812 Encounter for preprocedural laboratory examination: Secondary | ICD-10-CM | POA: Insufficient documentation

## 2013-08-12 DIAGNOSIS — I700 Atherosclerosis of aorta: Secondary | ICD-10-CM | POA: Insufficient documentation

## 2013-08-12 HISTORY — DX: Reserved for concepts with insufficient information to code with codable children: IMO0002

## 2013-08-12 HISTORY — DX: Atherosclerotic heart disease of native coronary artery without angina pectoris: I25.10

## 2013-08-12 HISTORY — DX: Acute myocardial infarction, unspecified: I21.9

## 2013-08-12 HISTORY — DX: Unspecified osteoarthritis, unspecified site: M19.90

## 2013-08-12 LAB — CBC
HCT: 43.4 % (ref 39.0–52.0)
Hemoglobin: 15.2 g/dL (ref 13.0–17.0)
MCH: 33.3 pg (ref 26.0–34.0)
MCV: 95 fL (ref 78.0–100.0)
RBC: 4.57 MIL/uL (ref 4.22–5.81)
WBC: 7.9 10*3/uL (ref 4.0–10.5)

## 2013-08-12 LAB — BASIC METABOLIC PANEL
BUN: 11 mg/dL (ref 6–23)
CO2: 28 mEq/L (ref 19–32)
Calcium: 10.9 mg/dL — ABNORMAL HIGH (ref 8.4–10.5)
Chloride: 99 mEq/L (ref 96–112)
Creatinine, Ser: 0.8 mg/dL (ref 0.50–1.35)
Glucose, Bld: 114 mg/dL — ABNORMAL HIGH (ref 70–99)
Potassium: 5 mEq/L (ref 3.5–5.1)

## 2013-08-12 LAB — SURGICAL PCR SCREEN: MRSA, PCR: NEGATIVE

## 2013-08-12 NOTE — Pre-Procedure Instructions (Addendum)
EKG AND CXR REPORTS ARE IN EPIC FROM 12/27/12. PT'S CARDIOLOGY WORK UP RESULTS [facility] 2014 IN EPIC UNDER OUTSIDE RECORDS - COPIES OF CLINICAL SUMMARY PRINTED FOR PT'S CHART

## 2013-08-12 NOTE — Patient Instructions (Signed)
YOUR SURGERY IS SCHEDULED AT [facility] ([facility])  ON:  Wednesday  12/17  REPORT TO  SHORT STAY CENTER AT:  6:30 AM      PHONE # FOR SHORT STAY IS [phone]  DO NOT EAT OR DRINK ANYTHING AFTER MIDNIGHT THE NIGHT BEFORE YOUR SURGERY.  YOU MAY BRUSH YOUR TEETH, RINSE OUT YOUR MOUTH--BUT NO WATER, NO FOOD, NO CHEWING GUM, NO MINTS, NO CANDIES, NO CHEWING TOBACCO.  PLEASE TAKE THE FOLLOWING MEDICATIONS THE AM OF YOUR SURGERY WITH A FEW SIPS OF WATER:  NO MEDS TO TAKE    DO NOT BRING VALUABLES, MONEY, CREDIT CARDS.  DO NOT WEAR JEWELRY, MAKE-UP, NAIL POLISH AND NO METAL PINS OR CLIPS IN YOUR HAIR. CONTACT LENS, DENTURES / PARTIALS, GLASSES SHOULD NOT BE WORN TO SURGERY AND IN MOST CASES-HEARING AIDS WILL NEED TO BE REMOVED.  BRING YOUR GLASSES CASE, ANY EQUIPMENT NEEDED FOR YOUR CONTACT LENS. FOR PATIENTS ADMITTED TO THE HOSPITAL--CHECK OUT TIME THE DAY OF DISCHARGE IS 11:00 AM.  ALL INPATIENT ROOMS ARE PRIVATE - WITH BATHROOM, TELEPHONE, TELEVISION AND WIFI INTERNET.                                                     PLEASE READ OVER ANY  FACT SHEETS THAT YOU WERE GIVEN: MRSA INFORMATION,  INCENTIVE SPIROMETER INFORMATION.  FAILURE TO FOLLOW THESE INSTRUCTIONS MAY RESULT IN THE CANCELLATION OF YOUR SURGERY. PLEASE BE AWARE THAT YOU MAY NEED ADDITIONAL BLOOD DRAWN DAY OF YOUR SURGERY  PATIENT SIGNATURE_________________________________

## 2013-08-16 NOTE — Anesthesia Preprocedure Evaluation (Addendum)
Anesthesia Evaluation  Patient identified by MRN, date of birth, ID band Patient awake    Reviewed: Allergy & Precautions, H&P , NPO status , Patient's Chart, lab work & pertinent test results  Airway Mallampati: II TM Distance: >3 FB Neck ROM: Full    Dental  (+) Teeth Intact and Dental Advisory Given   Pulmonary former smoker,  breath sounds clear to auscultation  Pulmonary exam normal       Cardiovascular hypertension, Pt. on medications + CAD and + Past MI Rhythm:Regular Rate:Normal     Neuro/Psych negative neurological ROS  negative psych ROS   GI/Hepatic negative GI ROS, (+)     substance abuse (Patient is a recovering alcoholic; continues to dring wine on occassion.)  alcohol use,   Endo/Other  negative endocrine ROS  Renal/GU negative Renal ROS  negative genitourinary   Musculoskeletal negative musculoskeletal ROS (+)   Abdominal   Peds negative pediatric ROS (+)  Hematology negative hematology ROS (+)   Anesthesia Other Findings   Reproductive/Obstetrics                          Anesthesia Physical Anesthesia Plan  ASA: III  Anesthesia Plan: General   Post-op Pain Management:    Induction: Intravenous  Airway Management Planned: Oral ETT  Additional Equipment:   Intra-op Plan:   Post-operative Plan: Extubation in OR  Informed Consent: I have reviewed the patients History and Physical, chart, labs and discussed the procedure including the risks, benefits and alternatives for the proposed anesthesia with the patient or authorized representative who has indicated his/her understanding and acceptance.   Dental advisory given  Plan Discussed with: CRNA  Anesthesia Plan Comments:         Anesthesia Quick Evaluation

## 2013-08-17 ENCOUNTER — Ambulatory Visit (HOSPITAL_COMMUNITY): Payer: 59

## 2013-08-17 ENCOUNTER — Ambulatory Visit (HOSPITAL_COMMUNITY): Payer: 59 | Admitting: Anesthesiology

## 2013-08-17 ENCOUNTER — Ambulatory Visit (HOSPITAL_COMMUNITY)
Admission: RE | Admit: 2013-08-17 | Discharge: 2013-08-17 | Disposition: A | Discharge status: Home / Self Care | Payer: 59 | Source: Ambulatory Visit | Attending: Specialist | Admitting: Specialist

## 2013-08-17 ENCOUNTER — Encounter (HOSPITAL_COMMUNITY): Payer: 59 | Admitting: Anesthesiology

## 2013-08-17 ENCOUNTER — Encounter (HOSPITAL_COMMUNITY)
Admission: RE | Disposition: A | Discharge status: Home / Self Care | Payer: Self-pay | Source: Ambulatory Visit | Attending: Specialist

## 2013-08-17 ENCOUNTER — Encounter (HOSPITAL_COMMUNITY): Payer: Self-pay | Admitting: Anesthesiology

## 2013-08-17 DIAGNOSIS — Z87891 Personal history of nicotine dependence: Secondary | ICD-10-CM | POA: Insufficient documentation

## 2013-08-17 DIAGNOSIS — I252 Old myocardial infarction: Secondary | ICD-10-CM | POA: Insufficient documentation

## 2013-08-17 DIAGNOSIS — M5126 Other intervertebral disc displacement, lumbar region: Secondary | ICD-10-CM | POA: Insufficient documentation

## 2013-08-17 DIAGNOSIS — F1021 Alcohol dependence, in remission: Secondary | ICD-10-CM | POA: Insufficient documentation

## 2013-08-17 DIAGNOSIS — M51379 Other intervertebral disc degeneration, lumbosacral region without mention of lumbar back pain or lower extremity pain: Secondary | ICD-10-CM | POA: Insufficient documentation

## 2013-08-17 DIAGNOSIS — I10 Essential (primary) hypertension: Secondary | ICD-10-CM | POA: Insufficient documentation

## 2013-08-17 DIAGNOSIS — M5137 Other intervertebral disc degeneration, lumbosacral region: Secondary | ICD-10-CM | POA: Insufficient documentation

## 2013-08-17 DIAGNOSIS — I2510 Atherosclerotic heart disease of native coronary artery without angina pectoris: Secondary | ICD-10-CM | POA: Insufficient documentation

## 2013-08-17 DIAGNOSIS — E785 Hyperlipidemia, unspecified: Secondary | ICD-10-CM | POA: Insufficient documentation

## 2013-08-17 HISTORY — PX: LUMBAR LAMINECTOMY/DECOMPRESSION MICRODISCECTOMY: SHX5026

## 2013-08-17 SURGERY — LUMBAR LAMINECTOMY/DECOMPRESSION MICRODISCECTOMY 1 LEVEL
Anesthesia: General

## 2013-08-17 MED ORDER — MIDAZOLAM HCL 5 MG/5ML IJ SOLN
INTRAMUSCULAR | Status: DC | PRN
  Administered 2013-08-17: 2 mg via INTRAVENOUS

## 2013-08-17 MED ORDER — SODIUM CHLORIDE 0.9 % IJ SOLN
INTRAMUSCULAR | Status: AC
  Filled 2013-08-17: qty 10

## 2013-08-17 MED ORDER — PROPOFOL 10 MG/ML IV BOLUS
INTRAVENOUS | Status: DC | PRN
  Administered 2013-08-17: 50 mg via INTRAVENOUS
  Administered 2013-08-17: 140 mg via INTRAVENOUS

## 2013-08-17 MED ORDER — HYDROCODONE-ACETAMINOPHEN 5-325 MG PO TABS: 1.0000 | ORAL_TABLET | ORAL | Status: DC | PRN

## 2013-08-17 MED ORDER — PROPOFOL 10 MG/ML IV BOLUS
INTRAVENOUS | Status: AC
  Filled 2013-08-17: qty 20

## 2013-08-17 MED ORDER — GLYCOPYRROLATE 0.2 MG/ML IJ SOLN
INTRAMUSCULAR | Status: AC
  Filled 2013-08-17: qty 2

## 2013-08-17 MED ORDER — BUPIVACAINE-EPINEPHRINE 0.5% -1:200000 IJ SOLN
INTRAMUSCULAR | Status: DC | PRN
  Administered 2013-08-17: 10 mL

## 2013-08-17 MED ORDER — ROCURONIUM BROMIDE 100 MG/10ML IV SOLN
INTRAVENOUS | Status: AC
  Filled 2013-08-17: qty 1

## 2013-08-17 MED ORDER — PROMETHAZINE HCL 25 MG/ML IJ SOLN: 6.2500 mg | INTRAMUSCULAR | Status: DC | PRN

## 2013-08-17 MED ORDER — ONDANSETRON HCL 4 MG/2ML IJ SOLN
INTRAMUSCULAR | Status: DC | PRN
  Administered 2013-08-17: 4 mg via INTRAVENOUS

## 2013-08-17 MED ORDER — SODIUM CHLORIDE 0.9 % IJ SOLN: 3.0000 mL | Freq: Two times a day (BID) | INTRAMUSCULAR | Status: DC

## 2013-08-17 MED ORDER — HYDROMORPHONE HCL PF 1 MG/ML IJ SOLN
INTRAMUSCULAR | Status: AC
  Filled 2013-08-17: qty 1

## 2013-08-17 MED ORDER — PHENOL 1.4 % MT LIQD: 1.0000 | OROMUCOSAL | Status: DC | PRN

## 2013-08-17 MED ORDER — FENTANYL CITRATE 0.05 MG/ML IJ SOLN
INTRAMUSCULAR | Status: DC | PRN
  Administered 2013-08-17 (×2): 50 ug via INTRAVENOUS
  Administered 2013-08-17: 100 ug via INTRAVENOUS
  Administered 2013-08-17 (×6): 50 ug via INTRAVENOUS

## 2013-08-17 MED ORDER — BUPIVACAINE-EPINEPHRINE PF 0.5-1:200000 % IJ SOLN
INTRAMUSCULAR | Status: AC
  Filled 2013-08-17: qty 30

## 2013-08-17 MED ORDER — ACETAMINOPHEN 325 MG PO TABS: 650.0000 mg | ORAL_TABLET | ORAL | Status: DC | PRN

## 2013-08-17 MED ORDER — CEFAZOLIN SODIUM-DEXTROSE 2-3 GM-% IV SOLR
INTRAVENOUS | Status: AC
  Filled 2013-08-17: qty 50

## 2013-08-17 MED ORDER — PHENYLEPHRINE HCL 10 MG/ML IJ SOLN
INTRAMUSCULAR | Status: AC
  Filled 2013-08-17: qty 1

## 2013-08-17 MED ORDER — METHOCARBAMOL 100 MG/ML IJ SOLN
500.0000 mg | Freq: Four times a day (QID) | INTRAVENOUS | Status: DC | PRN
  Administered 2013-08-17: 500 mg via INTRAVENOUS
  Filled 2013-08-17: qty 5

## 2013-08-17 MED ORDER — CEFAZOLIN SODIUM-DEXTROSE 2-3 GM-% IV SOLR
2.0000 g | INTRAVENOUS | Status: AC
  Administered 2013-08-17: 2 g via INTRAVENOUS

## 2013-08-17 MED ORDER — PHENYLEPHRINE HCL 10 MG/ML IJ SOLN
INTRAMUSCULAR | Status: DC | PRN
  Administered 2013-08-17 (×5): 80 ug via INTRAVENOUS

## 2013-08-17 MED ORDER — HYDROMORPHONE HCL PF 1 MG/ML IJ SOLN
0.2500 mg | INTRAMUSCULAR | Status: DC | PRN
  Administered 2013-08-17 (×4): 0.5 mg via INTRAVENOUS

## 2013-08-17 MED ORDER — ASPIRIN 325 MG PO TABS: 325.0000 mg | ORAL_TABLET | Freq: Every day | ORAL | Status: DC

## 2013-08-17 MED ORDER — CEFAZOLIN SODIUM-DEXTROSE 2-3 GM-% IV SOLR
2.0000 g | Freq: Three times a day (TID) | INTRAVENOUS | Status: DC
  Administered 2013-08-17: 2 g via INTRAVENOUS
  Filled 2013-08-17 (×2): qty 50

## 2013-08-17 MED ORDER — KCL IN DEXTROSE-NACL 20-5-0.45 MEQ/L-%-% IV SOLN
INTRAVENOUS | Status: DC
  Administered 2013-08-17: 50 mL/h via INTRAVENOUS
  Filled 2013-08-17: qty 1000

## 2013-08-17 MED ORDER — GLYCOPYRROLATE 0.2 MG/ML IJ SOLN
INTRAMUSCULAR | Status: DC | PRN
  Administered 2013-08-17: 0.4 mg via INTRAVENOUS

## 2013-08-17 MED ORDER — HYDROMORPHONE HCL PF 1 MG/ML IJ SOLN: 0.5000 mg | INTRAMUSCULAR | Status: DC | PRN

## 2013-08-17 MED ORDER — ESMOLOL HCL 10 MG/ML IV SOLN
INTRAVENOUS | Status: DC | PRN
  Administered 2013-08-17: 30 mg via INTRAVENOUS

## 2013-08-17 MED ORDER — OXYCODONE-ACETAMINOPHEN 7.5-325 MG PO TABS: 1.0000 | ORAL_TABLET | ORAL | Status: DC | PRN

## 2013-08-17 MED ORDER — SODIUM CHLORIDE 0.9 % IR SOLN
Status: DC | PRN
  Administered 2013-08-17: 09:00:00

## 2013-08-17 MED ORDER — LACTATED RINGERS IV SOLN: INTRAVENOUS | Status: DC

## 2013-08-17 MED ORDER — LIDOCAINE HCL (CARDIAC) 20 MG/ML IV SOLN
INTRAVENOUS | Status: AC
  Filled 2013-08-17: qty 5

## 2013-08-17 MED ORDER — NEOSTIGMINE METHYLSULFATE 1 MG/ML IJ SOLN
INTRAMUSCULAR | Status: DC | PRN
  Administered 2013-08-17: 3 mg via INTRAVENOUS

## 2013-08-17 MED ORDER — METHOCARBAMOL 500 MG PO TABS: 500.0000 mg | ORAL_TABLET | Freq: Four times a day (QID) | ORAL | Status: DC | PRN

## 2013-08-17 MED ORDER — MIDAZOLAM HCL 2 MG/2ML IJ SOLN
INTRAMUSCULAR | Status: AC
  Filled 2013-08-17: qty 2

## 2013-08-17 MED ORDER — GLYCOPYRROLATE 0.2 MG/ML IJ SOLN
INTRAMUSCULAR | Status: AC
  Filled 2013-08-17: qty 1

## 2013-08-17 MED ORDER — PHENYLEPHRINE 40 MCG/ML (10ML) SYRINGE FOR IV PUSH (FOR BLOOD PRESSURE SUPPORT)
PREFILLED_SYRINGE | INTRAVENOUS | Status: AC
  Filled 2013-08-17: qty 10

## 2013-08-17 MED ORDER — FENTANYL CITRATE 0.05 MG/ML IJ SOLN
INTRAMUSCULAR | Status: AC
  Filled 2013-08-17: qty 5

## 2013-08-17 MED ORDER — ONDANSETRON HCL 4 MG/2ML IJ SOLN
INTRAMUSCULAR | Status: AC
  Filled 2013-08-17: qty 2

## 2013-08-17 MED ORDER — PHENYLEPHRINE HCL 10 MG/ML IJ SOLN
10.0000 mg | INTRAVENOUS | Status: DC | PRN
  Administered 2013-08-17: 50 ug/min via INTRAVENOUS

## 2013-08-17 MED ORDER — LACTATED RINGERS IV SOLN
INTRAVENOUS | Status: DC | PRN
  Administered 2013-08-17 (×2): via INTRAVENOUS

## 2013-08-17 MED ORDER — SODIUM CHLORIDE 0.9 % IJ SOLN: 3.0000 mL | INTRAMUSCULAR | Status: DC | PRN

## 2013-08-17 MED ORDER — METOPROLOL SUCCINATE ER 25 MG PO TB24
25.0000 mg | ORAL_TABLET | Freq: Every day | ORAL | Status: DC
  Filled 2013-08-17: qty 1

## 2013-08-17 MED ORDER — LISINOPRIL 5 MG PO TABS
5.0000 mg | ORAL_TABLET | Freq: Every morning | ORAL | Status: DC
  Filled 2013-08-17: qty 1

## 2013-08-17 MED ORDER — EPHEDRINE SULFATE 50 MG/ML IJ SOLN
INTRAMUSCULAR | Status: AC
  Filled 2013-08-17: qty 1

## 2013-08-17 MED ORDER — NEOSTIGMINE METHYLSULFATE 1 MG/ML IJ SOLN
INTRAMUSCULAR | Status: AC
  Filled 2013-08-17: qty 10

## 2013-08-17 MED ORDER — DOCUSATE SODIUM 100 MG PO CAPS: 100.0000 mg | ORAL_CAPSULE | Freq: Two times a day (BID) | ORAL | Status: DC

## 2013-08-17 MED ORDER — SODIUM CHLORIDE 0.9 % IV SOLN: 250.0000 mL | INTRAVENOUS | Status: DC

## 2013-08-17 MED ORDER — OXYCODONE-ACETAMINOPHEN 5-325 MG PO TABS
1.0000 | ORAL_TABLET | ORAL | Status: DC | PRN
  Administered 2013-08-17 (×3): 1 via ORAL
  Filled 2013-08-17 (×3): qty 1

## 2013-08-17 MED ORDER — LIDOCAINE HCL (CARDIAC) 20 MG/ML IV SOLN
INTRAVENOUS | Status: DC | PRN
  Administered 2013-08-17: 80 mg via INTRAVENOUS

## 2013-08-17 MED ORDER — THROMBIN 5000 UNITS EX SOLR
CUTANEOUS | Status: AC
  Filled 2013-08-17: qty 10000

## 2013-08-17 MED ORDER — METHOCARBAMOL 500 MG PO TABS: 500.0000 mg | ORAL_TABLET | Freq: Three times a day (TID) | ORAL | Status: DC | PRN

## 2013-08-17 MED ORDER — MENTHOL 3 MG MT LOZG: 1.0000 | LOZENGE | OROMUCOSAL | Status: DC | PRN

## 2013-08-17 MED ORDER — CHLORHEXIDINE GLUCONATE 4 % EX LIQD: 60.0000 mL | Freq: Once | CUTANEOUS | Status: DC

## 2013-08-17 MED ORDER — ROCURONIUM BROMIDE 100 MG/10ML IV SOLN
INTRAVENOUS | Status: DC | PRN
  Administered 2013-08-17: 40 mg via INTRAVENOUS

## 2013-08-17 MED ORDER — ACETAMINOPHEN 650 MG RE SUPP: 650.0000 mg | RECTAL | Status: DC | PRN

## 2013-08-17 MED ORDER — ONDANSETRON HCL 4 MG/2ML IJ SOLN: 4.0000 mg | INTRAMUSCULAR | Status: DC | PRN

## 2013-08-17 MED ORDER — THROMBIN 5000 UNITS EX SOLR
OROMUCOSAL | Status: DC | PRN
  Administered 2013-08-17 (×2): via TOPICAL

## 2013-08-17 SURGICAL SUPPLY — 50 items
BAG SPEC THK2 15X12 ZIP CLS (MISCELLANEOUS) ×1
BAG ZIPLOCK 12X15 (MISCELLANEOUS) ×2 IMPLANT
CHLORAPREP W/TINT 26ML (MISCELLANEOUS) IMPLANT
CLEANER TIP ELECTROSURG 2X2 (MISCELLANEOUS) ×2 IMPLANT
CLOTH 2% CHLOROHEXIDINE 3PK (PERSONAL CARE ITEMS) ×2 IMPLANT
DECANTER SPIKE VIAL GLASS SM (MISCELLANEOUS) ×2 IMPLANT
DRAPE MICROSCOPE LEICA (MISCELLANEOUS) ×2 IMPLANT
DRAPE POUCH INSTRU U-SHP 10X18 (DRAPES) ×2 IMPLANT
DRAPE SURG 17X11 SM STRL (DRAPES) ×2 IMPLANT
DRAPE UTILITY XL STRL (DRAPES) ×2 IMPLANT
DRSG AQUACEL AG ADV 3.5X 4 (GAUZE/BANDAGES/DRESSINGS) ×1 IMPLANT
DRSG AQUACEL AG ADV 3.5X 6 (GAUZE/BANDAGES/DRESSINGS) IMPLANT
DURAFORM SPONGE 2X2 SINGLE (Neuro Prosthesis/Implant) ×2 IMPLANT
DURAPREP 26ML APPLICATOR (WOUND CARE) ×2 IMPLANT
DURASEAL SPINE SEALANT 3ML (MISCELLANEOUS) ×2 IMPLANT
ELECT BLADE TIP CTD 4 INCH (ELECTRODE) IMPLANT
ELECT REM PT RETURN 9FT ADLT (ELECTROSURGICAL) ×2
ELECTRODE REM PT RTRN 9FT ADLT (ELECTROSURGICAL) ×1 IMPLANT
GLOVE BIOGEL PI IND STRL 7.5 (GLOVE) ×1 IMPLANT
GLOVE BIOGEL PI INDICATOR 7.5 (GLOVE) ×1
GLOVE SURG SS PI 7.5 STRL IVOR (GLOVE) ×2 IMPLANT
GLOVE SURG SS PI 8.0 STRL IVOR (GLOVE) ×4 IMPLANT
GOWN STRL REIN XL XLG (GOWN DISPOSABLE) ×4 IMPLANT
IV CATH 14GX2 1/4 (CATHETERS) ×2 IMPLANT
KIT BASIN OR (CUSTOM PROCEDURE TRAY) ×2 IMPLANT
KIT POSITIONING SURG ANDREWS (MISCELLANEOUS) ×2 IMPLANT
MANIFOLD NEPTUNE II (INSTRUMENTS) ×2 IMPLANT
NDL SPNL 18GX3.5 QUINCKE PK (NEEDLE) ×2 IMPLANT
NEEDLE SPNL 18GX3.5 QUINCKE PK (NEEDLE) ×4 IMPLANT
PATTIES SURGICAL .5 X.5 (GAUZE/BANDAGES/DRESSINGS) IMPLANT
PATTIES SURGICAL .75X.75 (GAUZE/BANDAGES/DRESSINGS) IMPLANT
PATTIES SURGICAL 1X1 (DISPOSABLE) IMPLANT
SLEEVE SURGEON STRL (DRAPES) ×1 IMPLANT
SPONGE SURGIFOAM ABS GEL 100 (HEMOSTASIS) ×2 IMPLANT
STAPLER VISISTAT (STAPLE) IMPLANT
STRIP CLOSURE SKIN 1/2X4 (GAUZE/BANDAGES/DRESSINGS) ×2 IMPLANT
SUT NURALON 4 0 TR CR/8 (SUTURE) ×2 IMPLANT
SUT PROLENE 3 0 PS 2 (SUTURE) ×2 IMPLANT
SUT VIC AB 1 CT1 27 (SUTURE)
SUT VIC AB 1 CT1 27XBRD ANTBC (SUTURE) IMPLANT
SUT VIC AB 1-0 CT2 27 (SUTURE) ×2 IMPLANT
SUT VIC AB 2-0 CT1 27 (SUTURE)
SUT VIC AB 2-0 CT1 TAPERPNT 27 (SUTURE) IMPLANT
SUT VIC AB 2-0 CT2 27 (SUTURE) ×4 IMPLANT
SYR 3ML LL SCALE MARK (SYRINGE) ×1 IMPLANT
SYRINGE 10CC LL (SYRINGE) ×2 IMPLANT
TOWEL OR 17X26 10 PK STRL BLUE (TOWEL DISPOSABLE) ×2 IMPLANT
TOWEL OR NON WOVEN STRL DISP B (DISPOSABLE) ×2 IMPLANT
TRAY LAMINECTOMY (CUSTOM PROCEDURE TRAY) ×2 IMPLANT
YANKAUER SUCT BULB TIP NO VENT (SUCTIONS) ×2 IMPLANT

## 2013-08-17 NOTE — Care Management Note (Signed)
    Page 1 of 1   08/17/2013     5:20:29 PM   CARE MANAGEMENT NOTE 08/17/2013  Patient:  Matthew Costa, Matthew Costa   Account Number:  [id number]  Date Initiated:  08/17/2013  Documentation initiated by:  Colleen Can  Subjective/Objective Assessment:   DX HNP L5-S1; MICROLUMBAR DECOMPRESSION     Action/Plan:   HOME UPON DISCHARGE. NO HH OR DME NEEDS   Anticipated DC Date:  08/17/2013   Anticipated DC Plan:  HOME/SELF CARE      DC Planning Services  CM consult      Choice offered to / List presented to:             Status of service:   Medicare Important Message given?   (If response is "NO", the following Medicare IM given date fields will be blank) Date Medicare IM given:   Date Additional Medicare IM given:    Discharge Disposition:  HOME/SELF CARE  Per UR Regulation:    If discussed at Long Length of Stay Meetings, dates discussed:    Comments:

## 2013-08-17 NOTE — Interval H&P Note (Signed)
History and Physical Interval Note:  08/17/2013 8:22 AM  Matthew Costa  has presented today for surgery, with the diagnosis of HNP L5-S1  The various methods of treatment have been discussed with the patient and family. After consideration of risks, benefits and other options for treatment, the patient has consented to  Procedure(s): MICRO LUMBAR DECOMPRESSION L5-S1 (N/A) as a surgical intervention .  The patient's history has been reviewed, patient examined, no change in status, stable for surgery.  I have reviewed the patient's chart and labs.  Questions were answered to the patient's satisfaction.     Matthew Costa C

## 2013-08-17 NOTE — Anesthesia Postprocedure Evaluation (Signed)
Anesthesia Post Note  Patient: Matthew Costa  Procedure(s) Performed: Procedure(s) (LRB): MICRO LUMBAR DECOMPRESSION L5-S1 (N/A)  Anesthesia type: General  Patient location: PACU  Post pain: Pain level controlled  Post assessment: Post-op Vital signs reviewed  Last Vitals:  Filed Vitals:   08/17/13 1250  BP: 128/74  Pulse: 80  Temp: 36.7 C  Resp: 16    Post vital signs: Reviewed  Level of consciousness: sedated  Complications: No apparent anesthesia complications

## 2013-08-17 NOTE — Evaluation (Signed)
Occupational Therapy Evaluation Patient Details Name: Matthew Costa MRN: [id number] DOB: [birth year] Today's Date: 08/17/2013 Time: 4098-1191 OT Time Calculation (min): 10 min  OT Assessment / Plan / Recommendation History of present illness micro discectomy L5-S1   Clinical Impression   *Pt was admitted for the above surgery.  All education re:  Back precautions and adls was done.  Pt verbalizes understanding.  No further OT needs at this time.      OT Assessment  Patient does not need any further OT services    Follow Up Recommendations  No OT follow up    Barriers to Discharge      Equipment Recommendations  None recommended by OT    Recommendations for Other Services    Frequency       Precautions / Restrictions Precautions Precautions: Back Restrictions Weight Bearing Restrictions: No   Pertinent Vitals/Pain No c/o pain    ADL  Toilet Transfer: Independent Toilet Transfer Method: Sit to stand Toilet Transfer Equipment: Comfort height toilet Tub/Shower Transfer: Independent Tub/Shower Transfer Method: Science writer: Walk in shower Transfers/Ambulation Related to ADLs: Pt ambulated into bathroom and performed toilet and shower transfers.  He has a standard commode:  reviewed sitting backwards, if needed to keep back straight.  Commode here is comfort height.  Pt has a seat in his shower ADL Comments: Pt normally crosses legs and has some pain now.  Educated not to push past pain:  min A for LB adls and set up for UB adls.  Pt has reacher at home and assist as needed.  Reviewed alternate ways to complete LB ADLs safely, following back precautions    OT Diagnosis:    OT Problem List:   OT Treatment Interventions:     OT Goals(Current goals can be found in the care plan section)    Visit Information  Last OT Received On: 08/17/13 Assistance Needed: +1 History of Present Illness: micro discectomy L5-S1       Prior  Functioning     Home Living Family/patient expects to be discharged to:: Private residence Living Arrangements: Children;Spouse/significant other Prior Function Level of Independence: Independent Communication Communication: No difficulties         Vision/Perception     Cognition  Cognition Arousal/Alertness: Awake/alert Behavior During Therapy: WFL for tasks assessed/performed Overall Cognitive Status: Within Functional Limits for tasks assessed    Extremity/Trunk Assessment Upper Extremity Assessment Upper Extremity Assessment: Overall WFL for tasks assessed      Mobility Bed Mobility Bed Mobility: Supine to Sit Supine to Sit: 5: Supervision Details for Bed Mobility Assistance: cued for log rolling:  pt verbalizes understanding and states he does this at home Transfers Stand to Sit: 7: Independent     Exercise     Balance     End of Session OT - End of Session Activity Tolerance: Patient tolerated treatment well Patient left:  (handed off to PT)  GO Functional Assessment Tool Used: clinical observation/judgment Functional Limitation: Self care Self Care Current Status (Y7829): At least 60 percent but less than 80 percent impaired, limited or restricted Self Care Goal Status (F6213): At least 60 percent but less than 80 percent impaired, limited or restricted Self Care Discharge Status [phone]): At least 60 percent but less than 80 percent impaired, limited or restricted   [facility] 08/17/2013, 3:33 PM Marica Otter, OTR/L [phone] 08/17/2013

## 2013-08-17 NOTE — Evaluation (Signed)
Physical Therapy Evaluation Patient Details Name: Matthew Costa MRN: [id number] DOB: [birth year] Today's Date: 08/17/2013 Time: 4098-1191 PT Time Calculation (min): 7 min  PT Assessment / Plan / Recommendation History of Present Illness  micro discectomy L5-S1  Clinical Impression  Pt independent  In ambulation. Ready for DC. No needs.    PT Assessment  Patent does not need any further PT services    Follow Up Recommendations  No PT follow up    Does the patient have the potential to tolerate intense rehabilitation      Barriers to Discharge        Equipment Recommendations  None recommended by PT    Recommendations for Other Services     Frequency      Precautions / Restrictions Precautions Precautions: Back   Pertinent Vitals/Pain Sore back      Mobility  Transfers Transfers: Stand to Sit Stand to Sit: 7: Independent Ambulation/Gait Ambulation/Gait Assistance: 5: Supervision Ambulation Distance (Feet): 400 Feet Assistive device: None Ambulation/Gait Assistance Details: no difficulties, no balance loss. Gait Pattern: Within Functional Limits Gait velocity: wfl    Exercises     PT Diagnosis:    PT Problem List:   PT Treatment Interventions:       PT Goals(Current goals can be found in the care plan section)    Visit Information  Last PT Received On: 08/17/13 Assistance Needed: +1 History of Present Illness: micro discectomy L5-S1       Prior Functioning       Cognition  Cognition Arousal/Alertness: Awake/alert Behavior During Therapy: WFL for tasks assessed/performed Overall Cognitive Status: Within Functional Limits for tasks assessed    Extremity/Trunk Assessment Lower Extremity Assessment Lower Extremity Assessment: LLE deficits/detail;RLE deficits/detail RLE Deficits / Details: some numbness but better. LLE Deficits / Details: some numbness but better   Balance    End of Session PT - End of Session Activity Tolerance:  Patient tolerated treatment well Patient left: in bed;with family/visitor present Nurse Communication: Mobility status  GP     Rada Hay 08/17/2013, 3:26 PM

## 2013-08-17 NOTE — Brief Op Note (Signed)
08/17/2013  9:42 AM  PATIENT:  Matthew Costa  47 y.o. male  PRE-OPERATIVE DIAGNOSIS:  HNP L5-S1  POST-OPERATIVE DIAGNOSIS:  HNP L5-S1  PROCEDURE:  Procedure(s): MICRO LUMBAR DECOMPRESSION L5-S1 (N/A)  SURGEON:  Surgeon(s) and Role:    * Javier Docker, MD - Primary  PHYSICIAN ASSISTANT:   ASSISTANTS: Bissell   ANESTHESIA:   general  EBL:  Total I/O In: 1000 [I.V.:1000] Out: -   BLOOD ADMINISTERED:none  DRAINS: none   LOCAL MEDICATIONS USED:  MARCAINE     SPECIMEN:  No Specimen L5S1 disc  DISPOSITION OF SPECIMEN:  PATHOLOGY  COUNTS:  YES  TOURNIQUET:  * No tourniquets in log *  DICTATION: .Other Dictation: Dictation Number [id number]  PLAN OF CARE: Admit for overnight observation  PATIENT DISPOSITION:  PACU - hemodynamically stable.   Delay start of Pharmacological VTE agent (>24hrs) due to surgical blood loss or risk of bleeding: yes

## 2013-08-17 NOTE — Preoperative (Signed)
Beta Blockers   Reason not to administer Beta Blockers:Not Applicable 

## 2013-08-17 NOTE — H&P (View-Only) (Signed)
Matthew Costa is an 46 y.o. male.   Chief Complaint: back and leg pain HPI: The patient is a 46 year old male being followed for their right-sided back pain. They are now 9 week(s) out from when symptoms began. Symptoms reported today include: pain (right low back, buttock, and leg) and numbness (feet). The patient states that they are doing poorly. Current treatment includes: home exercise program, activity modification and pain medications. The following medication has been used for pain control: Percocet and Ultram. The patient reports their current pain level to be moderate to severe. The patient presents today following MRI. The patient has reported symptom improvement with: activity modification while they have not gotten any relief of their symptoms with: conservative measures, corticosteroid use (temporary relief, few days), NSAIDs, physical therapy (HEP) or rest. He continues to note pain R lower back, into R buttock, posterolateral thigh, anterior lower leg, dorsal foot. Denies left sided buttock or leg pain. Both feet feel cold and numb and it is painful for him to wear shoes. The right foot does still feel weak. His symptoms are much better standing, worse when sitting, especially when sitting in the car. He is also more comfortable lying down flat on his back with his left leg pulled up. Refractory to dosepak, NSAIDs, HEP, activity modifications, pain medications, relative rest. Ultram does not seem to be helping. He is taking Percocet as needed for pain. He has two young children at home, 3 and 5 years old.  Past Medical History  Diagnosis Date  . Hypertension   . Hyperlipidemia   . Depression     Past Surgical History  Procedure Laterality Date  . Cardiac catheterization      Family History  Problem Relation Age of Onset  . Healthy Mother   . Early death Father   . Heart disease Father    Social History:  reports that he has quit smoking. His smokeless tobacco use includes  Snuff. He reports that he drinks alcohol. He reports that he does not use illicit drugs.  Allergies: No Known Allergies   (Not in a hospital admission)  No results found for this or any previous visit (from the past 48 hour(s)). No results found.  Review of Systems  Constitutional: Negative.   HENT: Negative.   Eyes: Negative.   Respiratory: Negative.   Cardiovascular: Negative.   Gastrointestinal: Negative.   Genitourinary: Negative.   Musculoskeletal: Positive for back pain.  Skin: Negative.   Neurological: Positive for sensory change and focal weakness.  Endo/Heme/Allergies: Negative.   Psychiatric/Behavioral: Negative.     There were no vitals taken for this visit. Physical Exam  Constitutional: He is oriented to person, place, and time. He appears well-developed and well-nourished. He appears distressed.  HENT:  Head: Normocephalic and atraumatic.  Eyes: Conjunctivae and EOM are normal. Pupils are equal, round, and reactive to light.  Neck: Normal range of motion. Neck supple.  Cardiovascular: Normal rate and regular rhythm.   Respiratory: Effort normal and breath sounds normal.  GI: Soft. Bowel sounds are normal.  Musculoskeletal:  General Mental Status - Alert. General Appearance- pleasant and In acute distress. appears uncomfortable. Note: standing, pacing Orientation- Oriented X3. Build & Nutrition- Well nourished and Well developed.   Abdomen Palpation/Percussion:Tenderness- Abdomen is non-tender to palpation. Rigidity (guarding)- Abdomen is soft.   Peripheral Vascular Lower Extremity: Palpation:Homan's sign- Bilateral- Negative (normal). Posterior tibial pulse- Bilateral- 2+. Dorsalis pedis pulse- Bilateral- 2+.   Neurologic Motor: Strength :Hip Flexion- Bilateral- 5/5.   Quadriceps- Bilateral- 5/5. Hamstrings- Bilateral- 5/5. Ankle Dorsiflexion- Bilateral- 5/5. Ankle Plantarflexion- Bilateral- 5/5. Extensor Hallucis Longus-  Bilateral- 3+/5. Sensation:Lower Extremity- Bilateral- sensation is intact to light touch in the lower extremity. Reflexes:Patellar Reflex- Bilateral- 2+. Achilles Reflex- Bilateral- 2+. Babinski- Bilateral- Babinski not present. Clonus- Bilateral- clonus not present.   Musculoskeletal Spine/Ribs/Pelvis Lumbosacral Spine:Inspection and Palpation- Tenderness- right buttock is tender to palpation. no tenderness to palpation of the lumbar spinous processes, no tenderness to palpation of the right flank, no tenderness to palpation of right flank, no tenderness to palpation of left lumbar paraspinals, no tenderness to palpation of right lumbar paraspinals, no tenderness to palpation of the left greater trochanter and no tenderness to palpation of the right greater trochanter. Swelling- none. Sensation- normal. Other characteristics- no ecchymosis, no abnormal warmth, no erythema and no evidence of cellulitis. ROM- Flexion- severely decreased range of motion and painful. Extension- mildly decreased range of motion. Testing limited- due to pain. Special Testing- Lumbar- Right seated straight leg raise positive (produces buttock, thigh, lower leg pain). Left seated straight leg raise negative. Lower Extremity Right Lower Extremity: Right Hip: ROM:- Full ROM of the hip and - pain-free. Right Knee: ROM:- Full ROM of the knee and - pain-free. Right Ankle: ROM:- Full and pain-free ROM of the ankle. Left Lower Extremity: Left Hip : ROM:- Full ROM of the hip and - pain-free. Left Knee: ROM:- Full ROM of the knee and - pain-free. Left Ankle: ROM:- Full and pain-free ROM of the ankle.   Lymphatic General Lymphatics Description- No Localized lymphadenopathy.  Neurological: He is alert and oriented to person, place, and time. He has normal reflexes.  Skin: Skin is warm and dry.  Psychiatric: He has a normal mood and affect.    xrays Lspine with mild  scoliosis. Minimal hip degenerative changes. Mild DDD L5-S1. No instability or listhesis noted.  MRI Lspine images and report reviewed today by Dr. Beane with HNP L5-S1 to the right which impinges on the right L5 nerve root in the neural foramen on the right side. The disc also mildly displaces the right S1 nerve root.  Assessment/Plan HNP L5-S1  Pt with 5 weeks of acute onset R sided LBP with RLE radicular symptoms L5 distribution, dermatomal dysthesias, myotomal weakness, actually worsening EHL weakness since his prior visit, due to HNP L5-S1 right, refractory to prednisone dosepak, NSAIDs, pain medications, activity modifications, HEP, relative rest. We discussed relevant anatomy and his dx in detail using his MRI images, anatomic models, and illustrations. Discussed tx options in detail. Given his amount of pain and location of his HNP would not recommend formal PT as he would not be able to tolerate it. Given the location of his HNP, with foraminal stenosis at the L5 nerve root on the right at L5-S1, an ESI would likely not be beneficial for his pain at this point. Given all the above, his ongoing and worsening weakness, recommend microlumbar decompression L5-S1. We discussed the procedure itself as well as risks, complications, and alternatives including but not limited to DVT, infx, bleeding, failure of the procedure, need for secondary procedure, CSF leak, dural tear, nerve injury, anesthesia risk, even death. Discussed post-op activity limitations, spine precautions, possible need for PT, need for activity modifications to prevent reinjury or exacerbation. All questions were answered and he desires to proceed. We discussed the possibility of further progressive symptoms including foot drop. He will continue Percocet in the interim for pain and will add Neurontin at night to see if this helps   with his nerve pain. Given his prior hx of MI will obtain pre-op clearance from his primary care provider,  Andrew Maier PA-C. Clearance letter given. Once clearance is obtained, will proceed accordingly with scheduling. He plans to wait until the end of November due to his wife's busy work schedule this month, as long as his symptoms do not progress prior to that. He will call with any questions or concerns in the interim.  I had an extensive discussion of the risks and benefits of the lumbar decompression with the patient including bleeding, infection, damage to neurovascular structures, epidural fibrosis, CSF leak requiring repair. We also discussed increase in pain, adjacent segment disease, recurrent disc herniation, need for future surgery including repeat decompression and/or fusion. We also discussed risks of postoperative hematoma, paralysis, anesthetic complications including DVT, PE, death, cardiopulmonary dysfunction. In addition, the perioperative and postoperative courses were discussed in detail including the rehabilitative time and return to functional activity and work. I provided the patient with an illustrated handout and utilized the appropriate surgical models.  Plan microlumbar decompression L5-S1  BISSELL, JACLYN M. for Dr. Beane 08/02/2013, 3:32 PM    

## 2013-08-17 NOTE — Discharge Summary (Signed)
Physician Discharge Summary   Patient ID: Matthew Costa MRN: [id number] DOB/AGE: [birth year] 47 y.o.  Admit date: 08/17/2013 Discharge date: 08/17/2013  Primary Diagnosis:   HNP L5-S1  Admission Diagnoses:  Past Medical History  Diagnosis Date  . Hypertension   . Hyperlipidemia   . Coronary artery disease   . Myocardial infarction   . Depression     PT IS ALCOHOLIC- RECOVERING- COMPLETED 30 DAY [facility] APRIL 2014 - NO ALCOHOL SINCE  . Arthritis     SPINE  . HNP (herniated nucleus pulposus)     LUMBAR L5-S1- PAIN IS IN RT BUTTOCK AND DOWN RT LEG TO FEET- TINGLING IN FEET   Discharge Diagnoses:   Active Problems:   HNP (herniated nucleus pulposus), lumbar  Procedure:  Procedure(s) (LRB): MICRO LUMBAR DECOMPRESSION L5-S1 (N/A)   Consults: None  HPI:  see H&P    Laboratory Data: Hospital Outpatient Visit on 08/12/2013  Component Date Value Range Status  . Sodium 08/12/2013 140  135 - 145 mEq/L Final  . Potassium 08/12/2013 5.0  3.5 - 5.1 mEq/L Final  . Chloride 08/12/2013 99  96 - 112 mEq/L Final  . CO2 08/12/2013 28  19 - 32 mEq/L Final  . Glucose, Bld 08/12/2013 114* 70 - 99 mg/dL Final  . BUN 40/98/1191 11  6 - 23 mg/dL Final  . Creatinine, Ser 08/12/2013 0.80  0.50 - 1.35 mg/dL Final  . Calcium 47/82/9562 10.9* 8.4 - 10.5 mg/dL Final  . GFR calc non Af Amer 08/12/2013 >90  >90 mL/min Final  . GFR calc Af Amer 08/12/2013 >90  >90 mL/min Final   Comment: (NOTE)                          The eGFR has been calculated using the CKD EPI equation.                          This calculation has not been validated in all clinical situations.                          eGFR's persistently <90 mL/min signify possible Chronic Kidney                          Disease.  . WBC 08/12/2013 7.9  4.0 - 10.5 K/uL Final  . RBC 08/12/2013 4.57  4.22 - 5.81 MIL/uL Final  . Hemoglobin 08/12/2013 15.2  13.0 - 17.0 g/dL Final  . HCT 13/04/6577 43.4  39.0 - 52.0 %  Final  . MCV 08/12/2013 95.0  78.0 - 100.0 fL Final  . MCH 08/12/2013 33.3  26.0 - 34.0 pg Final  . MCHC 08/12/2013 35.0  30.0 - 36.0 g/dL Final  . RDW 46/96/2952 12.2  11.5 - 15.5 % Final  . Platelets 08/12/2013 197  150 - 400 K/uL Final  . MRSA, PCR 08/12/2013 NEGATIVE  NEGATIVE Final  . Staphylococcus aureus 08/12/2013 NEGATIVE  NEGATIVE Final   Comment:                                 The Xpert SA Assay (FDA                          approved for NASAL specimens  in patients over 36 years of age),                          is one component of                          a comprehensive surveillance                          program.  Test performance has                          been validated by [employer] for patients greater                          than or equal to 83 year old.                          It is not intended                          to diagnose infection nor to                          guide or monitor treatment.   No results found for this basename: HGB,  in the last 72 hours No results found for this basename: WBC, RBC, HCT, PLT,  in the last 72 hours No results found for this basename: NA, K, CL, CO2, BUN, CREATININE, GLUCOSE, CALCIUM,  in the last 72 hours No results found for this basename: LABPT, INR,  in the last 72 hours  X-Rays:Dg Lumbar Spine 2-3 Views  08/12/2013   CLINICAL DATA:  Bilateral leg pain more on left, preoperative evaluation for lumbar spine surgery  EXAM: LUMBAR SPINE - 2-3 VIEW  COMPARISON:  None  FINDINGS: Five non-rib-bearing lumbar type vertebrae.  Bones appear slightly demineralized.  Vertebral body and disc space heights maintained.  No acute fracture, subluxation or bone destruction.  Mild atherosclerotic calcification aorta.  Visualized pelvis unremarkable.  IMPRESSION: No acute lumbar spine abnormalities.   Electronically Signed   By: Ulyses Southward M.D.   On: 08/12/2013 12:53   Dg Spine  Portable 1 View  08/17/2013   CLINICAL DATA:  L5-S1 decompression.  EXAM: PORTABLE SPINE - 1 VIEW  COMPARISON:  08/17/2013 at 0905 hr.  FINDINGS: A single intraoperative cross-table lateral view the lumbar spine is submitted. Numbering system utilized on prior examinations is preserved. A surgical instrument tip projects over the L5-S1 interspace.  IMPRESSION: Intraoperative localization at L5-S1.   Electronically Signed   By: Leanna Battles M.D.   On: 08/17/2013 09:43   Dg Spine Portable 1 View  08/17/2013   CLINICAL DATA:  L5-S1 decompression.  EXAM: PORTABLE SPINE - 1 VIEW  COMPARISON:  08/17/2013 and 08/12/2013.  FINDINGS: A single intraoperative cross-table lateral view of the lumbar spine, taken at 0905 hr, is submitted. Numbering system utilized on prior examinations is preserved. Surgical instrument tip projects posterior to the S1 superior endplate.  IMPRESSION: Intraoperative localization at the L5-S1 level.   Electronically Signed   By: Leanna Battles M.D.  On: 08/17/2013 09:16   Dg Spine Portable 1 View  08/17/2013   CLINICAL DATA:  L5-S1 decompression  EXAM: PORTABLE SPINE - 1 VIEW  COMPARISON:  08/12/2013  FINDINGS: Needles are noted within the soft tissues posterior to the L5 and S1 levels. The numbering nomenclature follows that of the previous exam. No other focal abnormality is seen.   Electronically Signed   By: Alcide Clever M.D.   On: 08/17/2013 09:08    EKG: Orders placed during the hospital encounter of 12/23/12  . EKG 12-LEAD  . EKG 12-LEAD  . EKG 12-LEAD  . EKG 12-LEAD  . EKG 12-LEAD  . EKG 12-LEAD  . EKG  . EKG 12-LEAD  . EKG 12-LEAD  . EKG 12-LEAD  . EKG 12-LEAD     Hospital Course: Patient was admitted to [facility] and taken to the OR and underwent the above state procedure without complications.  Patient tolerated the procedure well and was later transferred to the recovery room and then to the orthopaedic floor for postoperative care.  They were  given PO and IV analgesics for pain control following their surgery.  They were given 24 hours of postoperative antibiotics.   PT was consulted postop to assist with mobility and transfers.  The patient was allowed to be WBAT with therapy and was taught back precautions. Discharge planning was consulted to help with postop disposition and equipment needs.  Patient started to get up OOB with therapy on same day of surgery. They were given discharge instructions and dressing directions.  They were instructed on when to follow up in the office with Dr. Shelle Iron.  Discharge Medications: Prior to Admission medications   Medication Sig Start Date End Date Taking? Authorizing Provider  atorvastatin (LIPITOR) 20 MG tablet Take 20 mg by mouth at bedtime.   Yes Historical Provider, MD  lisinopril (PRINIVIL,ZESTRIL) 5 MG tablet Take 5 mg by mouth every morning.   Yes Historical Provider, MD  metoprolol succinate (TOPROL-XL) 25 MG 24 hr tablet Take 25 mg by mouth at bedtime.   Yes Historical Provider, MD  oxyCODONE-acetaminophen (PERCOCET/ROXICET) 5-325 MG per tablet Take 1-2 tablets by mouth every 6 (six) hours as needed for moderate pain or severe pain.   Yes Historical Provider, MD  aspirin 325 MG tablet Take 1 tablet (325 mg total) by mouth daily. Resume 4 days post-op 08/17/13   Dayna Barker. Bissell, PA-C  methocarbamol (ROBAXIN) 500 MG tablet Take 1 tablet (500 mg total) by mouth 3 (three) times daily between meals as needed for muscle spasms. 08/17/13   Javier Docker, MD  oxyCODONE-acetaminophen (PERCOCET) 7.5-325 MG per tablet Take 1-2 tablets by mouth every 4 (four) hours as needed for pain. 08/17/13   Javier Docker, MD    Diet: low sodium, heart healthy Activity:WBAT; Lspine precautions Follow-up:in 10-14 days Disposition - Home Discharged Condition: good   Discharge Orders   Future Appointments Provider Department Dept Phone   11/14/2013 8:20 AM Laqueta Linden, MD [facility]  [facility] [phone]   Future Orders Complete By Expires   Call MD / Call 911  As directed    Comments:     If you experience chest pain or shortness of breath, CALL 911 and be transported to the hospital emergency room.  If you develope a fever above 101 F, pus (white drainage) or increased drainage or redness at the wound, or calf pain, call your surgeon's office.   Constipation Prevention  As directed  Comments:     Drink plenty of fluids.  Prune juice may be helpful.  You may use a stool softener, such as Colace (over the counter) 100 mg twice a day.  Use MiraLax (over the counter) for constipation as needed.   Diet - low sodium heart healthy  As directed    Increase activity slowly as tolerated  As directed        Medication List    STOP taking these medications       oxyCODONE-acetaminophen 5-325 MG per tablet  Commonly known as:  PERCOCET/ROXICET  Replaced by:  oxyCODONE-acetaminophen 7.5-325 MG per tablet      TAKE these medications       aspirin 325 MG tablet  Take 1 tablet (325 mg total) by mouth daily. Resume 4 days post-op     atorvastatin 20 MG tablet  Commonly known as:  LIPITOR  Take 20 mg by mouth at bedtime.     lisinopril 5 MG tablet  Commonly known as:  PRINIVIL,ZESTRIL  Take 5 mg by mouth every morning.     methocarbamol 500 MG tablet  Commonly known as:  ROBAXIN  Take 1 tablet (500 mg total) by mouth 3 (three) times daily between meals as needed for muscle spasms.     metoprolol succinate 25 MG 24 hr tablet  Commonly known as:  TOPROL-XL  Take 25 mg by mouth at bedtime.     oxyCODONE-acetaminophen 7.5-325 MG per tablet  Commonly known as:  PERCOCET  Take 1-2 tablets by mouth every 4 (four) hours as needed for pain.           Follow-up Information   Follow up with BEANE,JEFFREY C, MD In 2 weeks.   Specialty:  Orthopedic Surgery   Contact information:   [address] [place] [address] [phone]        Signed: Dorothy Spark. 08/17/2013, 4:17 PM

## 2013-08-17 NOTE — Progress Notes (Signed)
CSW consulted for SNF placement. PN reviewed. Pt plans to return home following hospital d/c. RNCM will assist with d/c planning, if needed.  Cori Razor LCSW [phone]

## 2013-08-17 NOTE — Transfer of Care (Signed)
Immediate Anesthesia Transfer of Care Note  Patient: Matthew Costa  Procedure(s) Performed: Procedure(s): MICRO LUMBAR DECOMPRESSION L5-S1 (N/A)  Patient Location: PACU  Anesthesia Type:General  Level of Consciousness: awake, alert  and oriented  Airway & Oxygen Therapy: Patient Spontanous Breathing and Patient connected to face mask oxygen  Post-op Assessment: Report given to PACU RN and Post -op Vital signs reviewed and stable  Post vital signs: Reviewed and stable  Complications: No apparent anesthesia complications

## 2013-08-18 ENCOUNTER — Encounter (HOSPITAL_COMMUNITY): Payer: Self-pay | Admitting: Specialist

## 2013-08-18 NOTE — Op Note (Signed)
NAMECYNTHIA, STAINBACK          ACCOUNT NO.:  [id number]  MEDICAL RECORD NO.:  [id number]  LOCATION:  1617                         FACILITY:  [facility]  PHYSICIAN:  Jene Every, M.D.    DATE OF BIRTH:  June 12, 1966  DATE OF PROCEDURE:  08/17/2013 DATE OF DISCHARGE:  08/17/2013                              OPERATIVE REPORT   PREOPERATIVE DIAGNOSES: 1. Herniated nucleus pulposus, L5-S1, right. 2. Spinal stenosis, L5-S1, right. 3. Disk degeneration, L5-S1, right.  POSTOPERATIVE DIAGNOSES: 1. Herniated nucleus pulposus, L5-S1, right. 2. Spinal stenosis, L5-S1, right. 3. Disk degeneration, L5-S1, right.  PROCEDURES PERFORMED: 1. Microlumbar decompression, L5-S1, right. 2. Foraminotomies, L5 and S1, right. 3. Microdiskectomy, L5-S1, right.  ANESTHESIA:  General.  ASSISTANT:  Lanna Poche, PA, who was utilized throughout the case for general intermittent neural traction, the patient positioning with help of the operating microscope.  SPECIMEN:  Disk to Pathology.  HISTORY:  A 47 year old male with refractory lower extremity radicular pain, EHL, weakness, and neural tension signs, indicated for decompression, finding and the MRI indicating a neural compressive lesion failing conservative treatment.  Risk and benefits were discussed including bleeding, infection, damage to neurovascular structures, DVT, PE, anesthetic complications, need for fusion in the future, recurrent disk, etc.  TECHNIQUE:  With the patient in supine position, after induction of adequate general anesthesia, 2 g Kefzol, placed prone on the [place] frame.  All bony prominences were well padded.  Lumbar region was prepped and draped in usual sterile fashion.  Two 18-gauge spinal needle was utilized to localize L5-S1 interspace, confirmed with x-ray. Incision was made from spinous process of L5-S1.  Subcutaneous tissue was dissected.  Electrocautery was utilized to achieve hemostasis. Dorsolumbar fascia  was divided in line with the skin incision. Paraspinous muscles were bluntly divided and retracted with a McCullough retractor after infiltration with 0.25% Marcaine with epinephrine. Operating microscope was draped and brought into the surgical field after confirmatory radiograph obtained for L5-S1.  First, ligamentum flavum was detached from the cephalad edge of S1 utilizing straight curette.  Neural patty was placed beneath the ligamentum flavum.  Ligamentum flavum then was removed from the interspace.  A foraminotomy of S1 was performed.  Hypertrophic ligamentum and facet were noted at L5-S1 on the right.  We identified the S1 nerve root, which was compressed into the lateral recess by disk herniation, epidural venous plexus and hypertrophied facet ligamentum flavum.  I gently mobilized the S1 nerve root medially.  Decompressed the lateral recess to the medial border of pedicle with 2-mm Kerrison. I performed a generous foraminotomy of S1, fully decompressing the foramen.  There were some stenosis out there as well and removed the portion of the superior articulating process of S1, perhaps 10% performing the foraminotomy of L5 as this was mild-to-moderately stenotic.  Focal disk herniation was noted.  Bipolar electrocautery was utilized to achieve hemostasis of an epidural venous plexus, which was then divided, further mobilizing the S1 nerve root.  I removed the free fragment with a micropituitary, performed an annulotomy.  Copious portion of the disk material was removed from the disk space.  Full diskectomy of herniated material was performed and further mobilized it with a neural retractor and removed the disk  herniation.  Confirmatory radiograph obtained at L5-S1.  Copiously irrigated the disk space, re- explored it, no further disk herniation noted.  Copiously irrigated again with catheter lavage by antibiotic irrigation.  I then expected beneath the thecal sac, axilla and the  shoulder of the root, the foramen of L5 and S1, no further pathology compressing the root 1 cm of excursion of the S1 nerve root, was noted medial to the pedicle without tension.  It was erythematous and edematous, but intact.  After diskectomy, epidural fat was then draped over the S1 nerve root.  We copiously irrigated the wound after the McCullough retractor was removed.  Prior to that, no evidence of CSF leakage or active bleeding was noted.  Closed the fascia with #1 Vicryl, subcu 2, irrigating 3-0 and subcuticular Prolene for skin.  Sterile dressing was applied. Placed supine on hospital bed, extubated without difficulty, and transported to the recovery room in satisfactory condition.  The patient tolerated the procedure well.  No complications.  Assistant, Lanna Poche, [place].  Minimal blood loss.     Jene Every, M.D.     Cordelia Pen  D:  08/17/2013  T:  08/18/2013  Job:  [id number]

## 2013-08-30 ENCOUNTER — Encounter: Payer: Self-pay | Admitting: *Deleted

## 2013-08-30 ENCOUNTER — Telehealth: Payer: Self-pay | Admitting: *Deleted

## 2013-08-30 DIAGNOSIS — E785 Hyperlipidemia, unspecified: Secondary | ICD-10-CM

## 2013-08-30 DIAGNOSIS — Z79899 Other long term (current) drug therapy: Secondary | ICD-10-CM

## 2013-08-30 DIAGNOSIS — I2510 Atherosclerotic heart disease of native coronary artery without angina pectoris: Secondary | ICD-10-CM

## 2013-08-30 NOTE — Telephone Encounter (Signed)
Message copied by Lesle Chris on Tue Aug 30, 2013 10:53 AM ------      Message from: Lesle Chris      Created: Tue Jul 19, 2013 10:25 AM      Regarding: labs due       Liver function - due in 1 month - 12/18 ------

## 2013-08-30 NOTE — Telephone Encounter (Signed)
Letter & order mailed today.   

## 2013-11-09 ENCOUNTER — Ambulatory Visit: Payer: 59 | Admitting: Cardiovascular Disease

## 2013-11-14 ENCOUNTER — Ambulatory Visit: Payer: 59 | Admitting: Cardiovascular Disease

## 2014-06-27 ENCOUNTER — Ambulatory Visit (INDEPENDENT_AMBULATORY_CARE_PROVIDER_SITE_OTHER): Payer: 59 | Admitting: Urology

## 2014-06-27 DIAGNOSIS — Z3009 Encounter for other general counseling and advice on contraception: Secondary | ICD-10-CM

## 2014-08-01 ENCOUNTER — Encounter (INDEPENDENT_AMBULATORY_CARE_PROVIDER_SITE_OTHER): Payer: 59 | Admitting: Urology

## 2014-08-01 DIAGNOSIS — Z302 Encounter for sterilization: Secondary | ICD-10-CM

## 2014-08-15 ENCOUNTER — Ambulatory Visit: Payer: 59 | Admitting: Urology

## 2014-09-01 HISTORY — PX: VASECTOMY: SHX75

## 2015-11-29 ENCOUNTER — Ambulatory Visit (INDEPENDENT_AMBULATORY_CARE_PROVIDER_SITE_OTHER): Payer: 59 | Admitting: Family Medicine

## 2015-11-29 ENCOUNTER — Encounter: Payer: Self-pay | Admitting: Family Medicine

## 2015-11-29 ENCOUNTER — Ambulatory Visit (INDEPENDENT_AMBULATORY_CARE_PROVIDER_SITE_OTHER): Payer: 59

## 2015-11-29 VITALS — BP 118/85 | HR 140 | Temp 97.7°F | Ht 71.0 in | Wt 238.0 lb

## 2015-11-29 DIAGNOSIS — R000 Tachycardia, unspecified: Secondary | ICD-10-CM | POA: Diagnosis not present

## 2015-11-29 DIAGNOSIS — R059 Cough, unspecified: Secondary | ICD-10-CM

## 2015-11-29 DIAGNOSIS — I10 Essential (primary) hypertension: Secondary | ICD-10-CM

## 2015-11-29 DIAGNOSIS — R05 Cough: Secondary | ICD-10-CM | POA: Diagnosis not present

## 2015-11-29 DIAGNOSIS — R600 Localized edema: Secondary | ICD-10-CM

## 2015-11-29 MED ORDER — METOPROLOL SUCCINATE ER 25 MG PO TB24: 25.0000 mg | ORAL_TABLET | Freq: Every day | ORAL | Status: AC

## 2015-11-29 MED ORDER — FUROSEMIDE 40 MG PO TABS: 40.0000 mg | ORAL_TABLET | Freq: Two times a day (BID) | ORAL | Status: AC

## 2015-11-29 NOTE — Progress Notes (Signed)
HPI  Patient presents today with leg edema and cough.  Patient explains in the last 2 weeks he's had significant leg swelling to the point where he can't wear his shoes. He has no pain. He states that he is a history of silent heart attack He's had a cough, most prominent when he lays down flat at night. He's had over 30 pound weight loss in the last 1 month.  Pt not taking any meds for 1+ year, states BP meds made him feel bad.   He denies shortness of breath, or exertional dyspnea. He denies chest pain.  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  PMH: Smoking status noted ROS: Per HPI  Objective: BP 118/85 mmHg  Pulse 140  Temp(Src) 97.7 F (36.5 C) (Oral)  Ht  (1.803 m)  Wt 238 lb (107.956 kg)  BMI 33.21 kg/m2 Gen: NAD, alert, cooperative with exam HEENT: NCAT CV: RRR, good S1/S2, no murmur Resp:  Crackles in the base that resolve with deep breath,  Abd: SNTND, BS present, no guarding or organomegaly Ext: No edema, warm Neuro: Alert and oriented, No gross deficits  EKG - tachy, unchanged Q waves in septal leads and inferior leads   Assessment and plan:  # Leg edema, cough Likely related, given cardiac Hx I think most likely culprit is R heart failure With Alcoholsim Hx consider cirrhosis but pt denies Check renal function For now diurese BID X 7 days, start low dose metop, I'm afraid his BP could not tolerate much but I think it will help manage his rate Urgent cardiology referral      Orders Placed This Encounter  Procedures  . DG Chest 2 View    Standing Status: Future     Number of Occurrences: 1     Standing Expiration Date: 01/28/2017    Order Specific Question:  Reason for Exam (SYMPTOM  OR DIAGNOSIS REQUIRED)    Answer:  cough, new leg edema, chf possible    Order Specific Question:  Preferred imaging location?    Answer:  Internal  . Pro b  natriuretic peptide  . Comprehensive metabolic panel  . CBC with Differential  . Ambulatory referral to Cardiology    Referral Priority:  Urgent    Referral Type:  Consultation    Referral Reason:  Specialty Services Required    Requested Specialty:  Cardiology    Number of Visits Requested:  1  . EKG 12-Lead  . Echocardiogram    Concern for CHF    Standing Status: Future     Number of Occurrences:      Standing Expiration Date: 02/28/2017    Order Specific Question:  Where should this test be performed    Answer:  [facility]    Order Specific Question:  Complete or Limited study?    Answer:  Complete    Order Specific Question:  With Image Enhancing Agent or without Image Enhancing Agent?    Answer:  With Image Enhancing Agent    Order Specific Question:  Expected Date:    Answer:  ASAP    Order Specific Question:  Reason for exam-Echo    Answer:  Abnormal ECG  794.31 / R94.31    Order Specific Question:  Reason for exam-Echo    Answer:  Other - See Comments Section    Meds ordered this encounter  Medications  . furosemide (LASIX)  40 MG tablet    Sig: Take 1 tablet (40 mg total) by mouth 2 (two) times daily.    Dispense:  60 tablet    Refill:  0  . metoprolol succinate (TOPROL-XL) 25 MG 24 hr tablet    Sig: Take 1 tablet (25 mg total) by mouth daily.    Dispense:  90 tablet    Refill:  3    Murtis SinkSam Hazel Leveille, MD [facility] 11/29/2015, 4:24 PM

## 2015-11-29 NOTE — Patient Instructions (Addendum)
Great to meet you  Take lasux twice daily for only 1 week  We will call within 1 week with labs  We will arrange an Echo and a cardiology appointment  PLease seek immediate medical attention if you have any chest pain or shortness of breath

## 2015-11-30 ENCOUNTER — Ambulatory Visit (INDEPENDENT_AMBULATORY_CARE_PROVIDER_SITE_OTHER): Payer: 59 | Admitting: Nurse Practitioner

## 2015-11-30 ENCOUNTER — Emergency Department (HOSPITAL_COMMUNITY): Payer: 59

## 2015-11-30 ENCOUNTER — Encounter (HOSPITAL_COMMUNITY): Payer: Self-pay | Admitting: *Deleted

## 2015-11-30 ENCOUNTER — Encounter: Payer: Self-pay | Admitting: Nurse Practitioner

## 2015-11-30 ENCOUNTER — Telehealth: Payer: Self-pay | Admitting: Family Medicine

## 2015-11-30 ENCOUNTER — Inpatient Hospital Stay (HOSPITAL_COMMUNITY)
Admission: EM | Admit: 2015-11-30 | Discharge: 2015-12-31 | DRG: 870 | Disposition: E | Payer: 59 | Attending: Internal Medicine | Admitting: Internal Medicine

## 2015-11-30 VITALS — BP 102/66 | HR 110 | Temp 99.6°F | Ht 71.0 in | Wt 231.0 lb

## 2015-11-30 DIAGNOSIS — K228 Other specified diseases of esophagus: Secondary | ICD-10-CM

## 2015-11-30 DIAGNOSIS — F1010 Alcohol abuse, uncomplicated: Secondary | ICD-10-CM

## 2015-11-30 DIAGNOSIS — E785 Hyperlipidemia, unspecified: Secondary | ICD-10-CM | POA: Diagnosis present

## 2015-11-30 DIAGNOSIS — Z6838 Body mass index (BMI) 38.0-38.9, adult: Secondary | ICD-10-CM

## 2015-11-30 DIAGNOSIS — D684 Acquired coagulation factor deficiency: Secondary | ICD-10-CM | POA: Diagnosis present

## 2015-11-30 DIAGNOSIS — Z66 Do not resuscitate: Secondary | ICD-10-CM | POA: Diagnosis not present

## 2015-11-30 DIAGNOSIS — R748 Abnormal levels of other serum enzymes: Secondary | ICD-10-CM | POA: Insufficient documentation

## 2015-11-30 DIAGNOSIS — K922 Gastrointestinal hemorrhage, unspecified: Secondary | ICD-10-CM

## 2015-11-30 DIAGNOSIS — R34 Anuria and oliguria: Secondary | ICD-10-CM | POA: Insufficient documentation

## 2015-11-30 DIAGNOSIS — R0902 Hypoxemia: Secondary | ICD-10-CM

## 2015-11-30 DIAGNOSIS — R1110 Vomiting, unspecified: Secondary | ICD-10-CM | POA: Diagnosis not present

## 2015-11-30 DIAGNOSIS — Z515 Encounter for palliative care: Secondary | ICD-10-CM | POA: Insufficient documentation

## 2015-11-30 DIAGNOSIS — J9600 Acute respiratory failure, unspecified whether with hypoxia or hypercapnia: Secondary | ICD-10-CM | POA: Insufficient documentation

## 2015-11-30 DIAGNOSIS — R7989 Other specified abnormal findings of blood chemistry: Secondary | ICD-10-CM

## 2015-11-30 DIAGNOSIS — K7031 Alcoholic cirrhosis of liver with ascites: Secondary | ICD-10-CM | POA: Diagnosis present

## 2015-11-30 DIAGNOSIS — Z87891 Personal history of nicotine dependence: Secondary | ICD-10-CM

## 2015-11-30 DIAGNOSIS — K7200 Acute and subacute hepatic failure without coma: Secondary | ICD-10-CM | POA: Diagnosis not present

## 2015-11-30 DIAGNOSIS — R140 Abdominal distension (gaseous): Secondary | ICD-10-CM

## 2015-11-30 DIAGNOSIS — A419 Sepsis, unspecified organism: Principal | ICD-10-CM | POA: Diagnosis present

## 2015-11-30 DIAGNOSIS — N171 Acute kidney failure with acute cortical necrosis: Secondary | ICD-10-CM | POA: Insufficient documentation

## 2015-11-30 DIAGNOSIS — R778 Other specified abnormalities of plasma proteins: Secondary | ICD-10-CM

## 2015-11-30 DIAGNOSIS — K208 Other esophagitis: Secondary | ICD-10-CM | POA: Diagnosis present

## 2015-11-30 DIAGNOSIS — T17990A Other foreign object in respiratory tract, part unspecified in causing asphyxiation, initial encounter: Secondary | ICD-10-CM | POA: Diagnosis not present

## 2015-11-30 DIAGNOSIS — J9601 Acute respiratory failure with hypoxia: Secondary | ICD-10-CM | POA: Insufficient documentation

## 2015-11-30 DIAGNOSIS — R680 Hypothermia, not associated with low environmental temperature: Secondary | ICD-10-CM | POA: Diagnosis present

## 2015-11-30 DIAGNOSIS — K2210 Ulcer of esophagus without bleeding: Secondary | ICD-10-CM | POA: Diagnosis present

## 2015-11-30 DIAGNOSIS — E669 Obesity, unspecified: Secondary | ICD-10-CM | POA: Diagnosis present

## 2015-11-30 DIAGNOSIS — D696 Thrombocytopenia, unspecified: Secondary | ICD-10-CM | POA: Diagnosis not present

## 2015-11-30 DIAGNOSIS — Z9289 Personal history of other medical treatment: Secondary | ICD-10-CM

## 2015-11-30 DIAGNOSIS — K2289 Other specified disease of esophagus: Secondary | ICD-10-CM

## 2015-11-30 DIAGNOSIS — J189 Pneumonia, unspecified organism: Secondary | ICD-10-CM

## 2015-11-30 DIAGNOSIS — E874 Mixed disorder of acid-base balance: Secondary | ICD-10-CM | POA: Diagnosis present

## 2015-11-30 DIAGNOSIS — R6521 Severe sepsis with septic shock: Secondary | ICD-10-CM | POA: Diagnosis present

## 2015-11-30 DIAGNOSIS — F10239 Alcohol dependence with withdrawal, unspecified: Secondary | ICD-10-CM | POA: Diagnosis present

## 2015-11-30 DIAGNOSIS — J9690 Respiratory failure, unspecified, unspecified whether with hypoxia or hypercapnia: Secondary | ICD-10-CM

## 2015-11-30 DIAGNOSIS — R17 Unspecified jaundice: Secondary | ICD-10-CM

## 2015-11-30 DIAGNOSIS — E871 Hypo-osmolality and hyponatremia: Secondary | ICD-10-CM | POA: Diagnosis present

## 2015-11-30 DIAGNOSIS — J690 Pneumonitis due to inhalation of food and vomit: Secondary | ICD-10-CM | POA: Diagnosis not present

## 2015-11-30 DIAGNOSIS — K7290 Hepatic failure, unspecified without coma: Secondary | ICD-10-CM

## 2015-11-30 DIAGNOSIS — IMO0002 Reserved for concepts with insufficient information to code with codable children: Secondary | ICD-10-CM

## 2015-11-30 DIAGNOSIS — R600 Localized edema: Secondary | ICD-10-CM

## 2015-11-30 DIAGNOSIS — I248 Other forms of acute ischemic heart disease: Secondary | ICD-10-CM | POA: Diagnosis present

## 2015-11-30 DIAGNOSIS — E8770 Fluid overload, unspecified: Secondary | ICD-10-CM | POA: Diagnosis not present

## 2015-11-30 DIAGNOSIS — I214 Non-ST elevation (NSTEMI) myocardial infarction: Secondary | ICD-10-CM

## 2015-11-30 DIAGNOSIS — E876 Hypokalemia: Secondary | ICD-10-CM | POA: Diagnosis not present

## 2015-11-30 DIAGNOSIS — E861 Hypovolemia: Secondary | ICD-10-CM | POA: Diagnosis not present

## 2015-11-30 DIAGNOSIS — K652 Spontaneous bacterial peritonitis: Secondary | ICD-10-CM | POA: Diagnosis present

## 2015-11-30 DIAGNOSIS — I10 Essential (primary) hypertension: Secondary | ICD-10-CM | POA: Diagnosis present

## 2015-11-30 DIAGNOSIS — E860 Dehydration: Secondary | ICD-10-CM | POA: Diagnosis present

## 2015-11-30 DIAGNOSIS — I2510 Atherosclerotic heart disease of native coronary artery without angina pectoris: Secondary | ICD-10-CM | POA: Diagnosis present

## 2015-11-30 DIAGNOSIS — K920 Hematemesis: Secondary | ICD-10-CM | POA: Diagnosis present

## 2015-11-30 DIAGNOSIS — E162 Hypoglycemia, unspecified: Secondary | ICD-10-CM | POA: Diagnosis not present

## 2015-11-30 DIAGNOSIS — D62 Acute posthemorrhagic anemia: Secondary | ICD-10-CM | POA: Diagnosis present

## 2015-11-30 DIAGNOSIS — Z79899 Other long term (current) drug therapy: Secondary | ICD-10-CM

## 2015-11-30 DIAGNOSIS — K449 Diaphragmatic hernia without obstruction or gangrene: Secondary | ICD-10-CM | POA: Diagnosis present

## 2015-11-30 DIAGNOSIS — I252 Old myocardial infarction: Secondary | ICD-10-CM

## 2015-11-30 DIAGNOSIS — R4182 Altered mental status, unspecified: Secondary | ICD-10-CM | POA: Diagnosis not present

## 2015-11-30 DIAGNOSIS — R570 Cardiogenic shock: Secondary | ICD-10-CM | POA: Diagnosis present

## 2015-11-30 HISTORY — DX: Alcohol abuse, uncomplicated: F10.10

## 2015-11-30 LAB — COMPREHENSIVE METABOLIC PANEL
ALBUMIN: 1.8 g/dL — AB (ref 3.5–5.0)
ALT: 37 IU/L (ref 0–44)
AST: 77 IU/L — ABNORMAL HIGH (ref 0–40)
AST: 121 U/L — ABNORMAL HIGH (ref 15–41)
Albumin/Globulin Ratio: 0.7 — ABNORMAL LOW (ref 1.2–2.2)
Albumin: 2.5 g/dL — ABNORMAL LOW (ref 3.5–5.5)
Alkaline Phosphatase: 187 IU/L — ABNORMAL HIGH (ref 39–117)
Alkaline Phosphatase: 104 U/L (ref 38–126)
Anion gap: 27 — ABNORMAL HIGH (ref 5–15)
BUN/Creatinine Ratio: 16 (ref 9–20)
BUN: 15 mg/dL (ref 6–24)
BUN: 27 mg/dL — AB (ref 6–20)
Bilirubin Total: 4.4 mg/dL — ABNORMAL HIGH (ref 0.0–1.2)
CALCIUM: 8.4 mg/dL — AB (ref 8.7–10.2)
CHLORIDE: 93 mmol/L — AB (ref 101–111)
CO2: 26 mmol/L (ref 18–29)
CO2: 13 mmol/L — ABNORMAL LOW (ref 22–32)
CREATININE: 2.88 mg/dL — AB (ref 0.61–1.24)
Calcium: 8.6 mg/dL — ABNORMAL LOW (ref 8.9–10.3)
Chloride: 86 mmol/L — ABNORMAL LOW (ref 96–106)
Creatinine, Ser: 0.94 mg/dL (ref 0.76–1.27)
GFR calc Af Amer: 111 mL/min/{1.73_m2} (ref >59)
GFR calc Af Amer: 28 mL/min — ABNORMAL LOW (ref >60)
GFR, EST NON AFRICAN AMERICAN: 96 mL/min/{1.73_m2} (ref >59)
GFR, EST NON AFRICAN AMERICAN: 24 mL/min — AB (ref >60)
GLUCOSE: 116 mg/dL — AB (ref 65–99)
GLUCOSE: 41 mg/dL — AB (ref 65–99)
Globulin, Total: 3.5 g/dL (ref 1.5–4.5)
Potassium: 3.9 mmol/L (ref 3.5–5.2)
Potassium: 4.3 mmol/L (ref 3.5–5.1)
Sodium: 127 mmol/L — ABNORMAL LOW (ref 134–144)
Sodium: 133 mmol/L — ABNORMAL LOW (ref 135–145)
Total Bilirubin: 7.6 mg/dL — ABNORMAL HIGH (ref 0.3–1.2)
Total Protein: 6 g/dL (ref 6.0–8.5)
Total Protein: 5.1 g/dL — ABNORMAL LOW (ref 6.5–8.1)

## 2015-11-30 LAB — CBC WITH DIFFERENTIAL/PLATELET
BASOS: 0 %
Basophils Absolute: 0 10*3/uL (ref 0.0–0.2)
EOS (ABSOLUTE): 0 10*3/uL (ref 0.0–0.4)
Eos: 0 %
HEMATOCRIT: 39.8 % (ref 37.5–51.0)
Hemoglobin: 13.6 g/dL (ref 12.6–17.7)
IMMATURE GRANULOCYTES: 0 %
Immature Grans (Abs): 0 10*3/uL (ref 0.0–0.1)
LYMPHS: 8 %
Lymphocytes Absolute: 1 10*3/uL (ref 0.7–3.1)
MCH: 38.3 pg — ABNORMAL HIGH (ref 26.6–33.0)
MCHC: 34.2 g/dL (ref 31.5–35.7)
MCV: 112 fL — AB (ref 79–97)
Monocytes Absolute: 1 10*3/uL — ABNORMAL HIGH (ref 0.1–0.9)
Monocytes: 9 %
Neutrophils Absolute: 9.6 10*3/uL — ABNORMAL HIGH (ref 1.4–7.0)
Neutrophils: 83 %
Platelets: 155 10*3/uL (ref 150–379)
RBC: 3.55 x10E6/uL — AB (ref 4.14–5.80)
RDW: 14.7 % (ref 12.3–15.4)
WBC: 11.6 10*3/uL — ABNORMAL HIGH (ref 3.4–10.8)

## 2015-11-30 LAB — CBG MONITORING, ED
GLUCOSE-CAPILLARY: 23 mg/dL — AB (ref 65–99)
Glucose-Capillary: 26 mg/dL — CL (ref 65–99)
Glucose-Capillary: 119 mg/dL — ABNORMAL HIGH (ref 65–99)
Glucose-Capillary: 100 mg/dL — ABNORMAL HIGH (ref 65–99)

## 2015-11-30 LAB — URINE MICROSCOPIC-ADD ON

## 2015-11-30 LAB — CBC
HEMATOCRIT: 33 % — AB (ref 39.0–52.0)
Hemoglobin: 10.6 g/dL — ABNORMAL LOW (ref 13.0–17.0)
MCH: 37.1 pg — AB (ref 26.0–34.0)
MCHC: 32.1 g/dL (ref 30.0–36.0)
MCV: 115.4 fL — ABNORMAL HIGH (ref 78.0–100.0)
Platelets: 188 10*3/uL (ref 150–400)
RBC: 2.86 MIL/uL — ABNORMAL LOW (ref 4.22–5.81)
RDW: 14.6 % (ref 11.5–15.5)
WBC: 14.7 10*3/uL — AB (ref 4.0–10.5)

## 2015-11-30 LAB — URINALYSIS, ROUTINE W REFLEX MICROSCOPIC
GLUCOSE, UA: NEGATIVE mg/dL
Ketones, ur: 15 mg/dL — AB
Nitrite: POSITIVE — AB
PH: 5 (ref 5.0–8.0)
PROTEIN: 100 mg/dL — AB
Specific Gravity, Urine: 1.024 (ref 1.005–1.030)

## 2015-11-30 LAB — AMMONIA: Ammonia: 93 umol/L — ABNORMAL HIGH (ref 9–35)

## 2015-11-30 LAB — LIPASE, BLOOD: Lipase: 24 U/L (ref 11–51)

## 2015-11-30 LAB — I-STAT CG4 LACTIC ACID, ED: Lactic Acid, Venous: >17 mmol/L (ref 0.5–2.0)

## 2015-11-30 LAB — I-STAT TROPONIN, ED: TROPONIN I, POC: 1.41 ng/mL — AB (ref 0.00–0.08)

## 2015-11-30 LAB — ABO/RH: ABO/RH(D): A POS

## 2015-11-30 LAB — PRO B NATRIURETIC PEPTIDE: NT-Pro BNP: 224 pg/mL — ABNORMAL HIGH (ref 0–121)

## 2015-11-30 LAB — PREPARE RBC (CROSSMATCH)

## 2015-11-30 LAB — PROTIME-INR
INR: 3.25 — AB (ref 0.00–1.49)
Prothrombin Time: 32.5 seconds — ABNORMAL HIGH (ref 11.6–15.2)

## 2015-11-30 MED ORDER — DEXTROSE 50 % IV SOLN
INTRAVENOUS | Status: AC
Start: 2015-11-30 — End: 2015-12-01
  Filled 2015-11-30: qty 50

## 2015-11-30 MED ORDER — OCTREOTIDE ACETATE 500 MCG/ML IJ SOLN
50.0000 ug/h | INTRAMUSCULAR | Status: DC
  Administered 2015-11-30 – 2015-12-01 (×3): 50 ug/h via INTRAVENOUS
  Filled 2015-11-30 (×6): qty 1

## 2015-11-30 MED ORDER — SODIUM CHLORIDE 0.9 % IV BOLUS (SEPSIS)
1000.0000 mL | Freq: Once | INTRAVENOUS | Status: AC
  Administered 2015-11-30: 1000 mL via INTRAVENOUS

## 2015-11-30 MED ORDER — SODIUM CHLORIDE 0.9 % IV SOLN
8.0000 mg/h | INTRAVENOUS | Status: AC
  Administered 2015-11-30 – 2015-12-03 (×9): 8 mg/h via INTRAVENOUS
  Filled 2015-11-30 (×14): qty 80

## 2015-11-30 MED ORDER — DEXTROSE 5 % IV SOLN: Freq: Once | INTRAVENOUS | Status: DC

## 2015-11-30 MED ORDER — FAMOTIDINE IN NACL 20-0.9 MG/50ML-% IV SOLN
20.0000 mg | Freq: Once | INTRAVENOUS | Status: AC
  Administered 2015-12-01: 20 mg via INTRAVENOUS
  Filled 2015-11-30: qty 50

## 2015-11-30 MED ORDER — SODIUM CHLORIDE 0.9 % IV SOLN
80.0000 mg | Freq: Once | INTRAVENOUS | Status: AC
  Administered 2015-11-30: 80 mg via INTRAVENOUS
  Filled 2015-11-30: qty 80

## 2015-11-30 MED ORDER — DEXTROSE 5 % IV SOLN
Freq: Once | INTRAVENOUS | Status: AC
  Administered 2015-11-30: 21:00:00 via INTRAVENOUS

## 2015-11-30 MED ORDER — OCTREOTIDE LOAD VIA INFUSION
100.0000 ug | Freq: Once | INTRAVENOUS | Status: AC
  Administered 2015-11-30: 100 ug via INTRAVENOUS
  Filled 2015-11-30: qty 50

## 2015-11-30 MED ORDER — VANCOMYCIN HCL 10 G IV SOLR
1500.0000 mg | Freq: Once | INTRAVENOUS | Status: AC
  Administered 2015-12-01: 1500 mg via INTRAVENOUS
  Filled 2015-11-30: qty 1500

## 2015-11-30 MED ORDER — SODIUM CHLORIDE 0.9 % IV SOLN
10.0000 mL/h | Freq: Once | INTRAVENOUS | Status: AC
  Administered 2015-11-30: 10 mL/h via INTRAVENOUS

## 2015-11-30 MED ORDER — LACTULOSE 10 GM/15ML PO SOLN
30.0000 g | Freq: Once | ORAL | Status: AC
  Administered 2015-11-30: 30 g via ORAL
  Filled 2015-11-30: qty 45

## 2015-11-30 MED ORDER — PIPERACILLIN-TAZOBACTAM 3.375 G IVPB
3.3750 g | Freq: Once | INTRAVENOUS | Status: DC
  Filled 2015-11-30: qty 50

## 2015-11-30 MED ORDER — DEXTROSE 50 % IV SOLN
50.0000 mL | Freq: Once | INTRAVENOUS | Status: AC
  Administered 2015-11-30: 50 mL via INTRAVENOUS

## 2015-11-30 NOTE — ED Provider Notes (Signed)
The patient is a critically ill 50 year old male who presents in acute liver failure. According to his wife who is the primary historian the patient was seen at his doctor's office a couple of days ago because of swelling. At that time the doctor ordered some lab work and when it came back today showing multiple other maladies he was encouraged to come to the emergency department. The patient arrives with significant altered mental status, persistent hypoglycemia, tachycardia, hypotension, diffuse jaundice and on my exam is cold to the touch, clammy, has poor IV access and diffuse severe bilateral lower extremity symmetrical swelling. His mucous membranes are dry, his conjunctivae are jaundiced and icteric, his abdomen is tense and tight but nontender, there is a fluid wave. Lungs are clear, cardiac rhythm is in a sinus tachycardia, I personally placed 2 peripheral IVs, fluid boluses ordered, labs show that he is in acute liver failure, acute renal failure, his lactic acid is greater than 17 and the patient appears critically ill. He is a multiorgan system failure and will need high level of care.  Review of the labs show that the patient is in multisystem organ failure with the following  #1 acute liver failure with coagulopathy and upper GI bleed with new anemia, he has dropped 3 g of hemoglobin in the last 24 hours - on Octreotide and volum resuscitation  #2 acute kidney failure, creatinine of 3  #3 anemia secondary to acute liver failure and GI bleed  #4 non-ST elevation MI likely secondary to the stress from the above problems though I cannot rule out occult obstructive disease thus cardiology was consulted  #5 hypoglycemia likely also secondary to liver dysfunction requiring a D5W drip  #6 hypothermia - active rewarming  #7 Shock - likely secondary to all of the above including anemia   Discussed with Dr. Darrick Pennaeterding of the intensive care unit service Discussed with Dr. Dulce Sellaroutlaw of the GI  service (will see in the AM - agreeable to blood products and octreotide) This discussed with Cardiology service on call physician - will see in consultation   Angiocath insertion Performed by: Vida RollerMILLER,Kalkidan Caudell D  Consent: Verbal consent obtained. Risks and benefits: risks, benefits and alternatives were discussed Time out: Immediately prior to procedure a "time out" was called to verify the correct patient, procedure, equipment, support staff and site/side marked as required.  Preparation: Patient was prepped and draped in the usual sterile fashion.  Vein Location: L AC  Not Ultrasound Guided  Gauge: 20  Normal blood return and flush without difficulty Patient tolerance: Patient tolerated the procedure well with no immediate complications.   Angiocath insertion Performed by: Vida RollerMILLER,Syvanna Ciolino D  Consent: Verbal consent obtained. Risks and benefits: risks, benefits and alternatives were discussed Time out: Immediately prior to procedure a "time out" was called to verify the correct patient, procedure, equipment, support staff and site/side marked as required.  Preparation: Patient was prepped and draped in the usual sterile fashion.  Vein Location: R AC  Not Ultrasound Guided  Gauge: 20  Normal blood return and flush without difficulty Patient tolerance: Patient tolerated the procedure well with no immediate complications.   EKG Interpretation  Date/Time:  Friday November 30 2015 18:41:42 EDT Ventricular Rate:  127 PR Interval:  124 QRS Duration: 92 QT Interval:  336 QTC Calculation: 488 R Axis:   87 Text Interpretation:  Sinus tachycardia Inferior infarct , age undetermined Anterolateral infarct , age undetermined Abnormal ECG No old tracing to compare Confirmed by Hyacinth MeekerMILLER  MD, Asir Bingley (  [id number]) on 11/16/2015 8:13:02 PM      Medical screening examination/treatment/procedure(s) were conducted as a shared visit with non-physician practitioner(s) and myself.  I personally evaluated  the patient during the encounter.  Clinical Impression:   Final diagnoses:  MOSF (multiple organ systems failure)  NSTEMI (non-ST elevated myocardial infarction) (HCC)  Acute liver failure  Acute renal failure with acute cortical necrosis (HCC)  Upper GI bleed  Anuria    CRITICAL CARE Performed by: Vida Roller Total critical care time: 75 minutes Critical care time was exclusive of separately billable procedures and treating other patients. Critical care was necessary to treat or prevent imminent or life-threatening deterioration. Critical care was time spent personally by me on the following activities: development of treatment plan with patient and/or surrogate as well as nursing, discussions with consultants, evaluation of patient's response to treatment, examination of patient, obtaining history from patient or surrogate, ordering and performing treatments and interventions, ordering and review of laboratory studies, ordering and review of radiographic studies, pulse oximetry and re-evaluation of patient's condition.       Eber Hong, MD 12/11/2015 1239

## 2015-11-30 NOTE — Progress Notes (Signed)
   Subjective:    Patient ID: Druscilla BrownieChristopher E Otting, male    DOB: May 25, 1966, 50 y.o.   MRN: [id number]  HPI Patient in today c/o edema and just felt bad- he saw Dr. Ermalinda MemosBradshaw yesterday and he did a bunch of blood work on him. BNP elevated at 224 which is a sign of congestive heart failure. Bilirubin elevated at 4.4 as well as elevated alkaline phosphatase. Woke up this morning with nausea and vomiting. Has vomited 5-6 times today was black looking almost like coffee grounds.Feels worse today then yesterday- more swollen- face pale and slightly yellow which he did not have yesterday according to wife. He has history of alcoholism that was treated but he still drinks daily, according to wife.    Review of Systems  Constitutional: Positive for appetite change.  HENT: Negative.   Respiratory: Positive for shortness of breath.   Cardiovascular: Positive for leg swelling. Negative for chest pain and palpitations.  Gastrointestinal: Positive for nausea, vomiting, abdominal pain, diarrhea and abdominal distention. Negative for constipation.  Genitourinary: Negative.   Musculoskeletal: Negative.   Neurological: Negative.   Psychiatric/Behavioral: Negative.   All other systems reviewed and are negative.      Objective:   Physical Exam  Constitutional: He appears well-developed and well-nourished.  Eyes: Scleral icterus (bil) is present.  Neck: Normal range of motion.  Cardiovascular: Normal rate, regular rhythm and normal heart sounds.   Pulmonary/Chest: Effort normal and breath sounds normal.  Abdominal: Soft. He exhibits distension. There is tenderness (mild upper quadrant tenderness).  Musculoskeletal: He exhibits edema (3+ edema bil feet ,ankles and lower ext).  Gait very slow and weak appearing  Skin: There is pallor.  jaundice   BP 102/66 mmHg  Pulse 110  Temp(Src) 99.6 F (37.6 C) (Oral)  Ht 5\' 11"  (1.803 m)         Assessment & Plan:   1. Jaundice   2. Abdominal  distention   3. Edema of both legs   4. Intractable vomiting with nausea, vomiting of unspecified type    Due to his appearance i suggested that [facility] NPO until gets to hospital  Mary-Margaret Daphine DeutscherMartin, FNP

## 2015-11-30 NOTE — Telephone Encounter (Signed)
Pt given appt in the afterhours clinic this afternoon.

## 2015-11-30 NOTE — ED Notes (Signed)
CBG 26. CHARGE RN AND MD NOTIFIED.

## 2015-11-30 NOTE — ED Notes (Signed)
Per dr Hyacinth Meekermiller the pt can be given food with juice or a coke until his blood sugar rises or until he gets a bed

## 2015-11-30 NOTE — H&P (Signed)
PULMONARY / CRITICAL CARE MEDICINE HISTORY AND PHYSICAL EXAMINATION   Name: Matthew Costa MRN: [id number] DOB: 07/21/1966    ADMISSION DATE:  11/29/2015  PRIMARY SERVICE: PCCM  CHIEF COMPLAINT:  Swelling, confusion  BRIEF PATIENT DESCRIPTION: 50 y/o man with hx of EtOH abuse presenting with jaundice, swelling, and confusion  SIGNIFICANT EVENTS / STUDIES:  - Profound Lacitic acidosis (>16) - Hepatic dysfunction - Renal dysfunction  LINES / TUBES: - PIV x3  CULTURES: Blood x2 (3/31)  ANTIBIOTICS: Vanc 3/31 >> Zosyn 3/31 >>  HISTORY OF PRESENT ILLNESS:   Matthew Costa is a 50 y/o man with a hx of EtOH abuse as well as coronary disease (prior NSTEMI) who presented to a family medicine clinic with complaints of lower extremity swelling and was treated for volume overload, and had some general chemistries drawn which were relatively unremarkable, save for low albumin, and mild elevations in transaminases, alk phos, and Tbili. The next day (today), he developed worsening edema, as well as confusion, and jaundice. His wife brought him to the ED for evaluation. His chemistries showed marked change, with acute renal failure and hepatic failure, acutely changed from yesterday. He did have some emesis of dark material, including some possible blood.  PAST MEDICAL HISTORY :  Past Medical History  Diagnosis Date  . Hypertension   . Hyperlipidemia   . Coronary artery disease   . Myocardial infarction ([place])   . Depression     PT IS ALCOHOLIC- RECOVERING- COMPLETED 30 DAY FELLOWSHIP HALL REHAB APRIL 2014 - NO ALCOHOL SINCE  . Arthritis     SPINE  . HNP (herniated nucleus pulposus)     LUMBAR L5-S1- PAIN IS IN RT BUTTOCK AND DOWN RT LEG TO FEET- TINGLING IN FEET  . ETOH abuse    Past Surgical History  Procedure Laterality Date  . Cardiac catheterization    . Tonsillectomy      AS A CHILD  . Lumbar laminectomy/decompression microdiscectomy N/A 08/17/2013    Procedure: MICRO LUMBAR  DECOMPRESSION L5-S1;  Surgeon: Johnn Hai, MD;  Location: [facility];  Service: Orthopedics;  Laterality: N/A;  . Vasectomy  2016   Prior to Admission medications   Medication Sig Start Date End Date Taking? Authorizing Provider  dextromethorphan (DELSYM) 30 MG/5ML liquid Take 60 mg by mouth as needed for cough.   Yes Historical Provider, MD  furosemide (LASIX) 40 MG tablet Take 1 tablet (40 mg total) by mouth 2 (two) times daily. 11/29/15  Yes Timmothy Euler, MD  ibuprofen (ADVIL,MOTRIN) 200 MG tablet Take 400 mg by mouth every 6 (six) hours as needed.   Yes Historical Provider, MD  metoprolol succinate (TOPROL-XL) 25 MG 24 hr tablet Take 1 tablet (25 mg total) by mouth daily. 11/29/15   Timmothy Euler, MD   No Known Allergies  FAMILY HISTORY:  Family History  Problem Relation Age of Onset  . Healthy Mother   . Early death Father   . Heart disease Father    SOCIAL HISTORY:  reports that he has quit smoking. His smoking use included Cigarettes. He has a 15 pack-year smoking history. His smokeless tobacco use includes Snuff. He reports that he drinks alcohol. He reports that he does not use illicit drugs.  REVIEW OF SYSTEMS:  Per HPI  SUBJECTIVE:   VITAL SIGNS: Temp:  [95.7 F (35.4 C)-99.6 F (37.6 C)] 95.7 F (35.4 C) (03/31 2339) Pulse Rate:  [100-125] 100 (03/31 2339) Resp:  [22-35] 27 (03/31 2339) BP: (64-123)/(37-87)  90/60 mmHg (03/31 2339) SpO2:  [95 %-100 %] 100 % (03/31 2315) Weight:  [231 lb (104.781 kg)-232 lb (105.235 kg)] 232 lb (105.235 kg) (03/31 1840) HEMODYNAMICS:   VENTILATOR SETTINGS:   INTAKE / OUTPUT: Intake/Output      03/31 0701 - 04/01 0700   I.V. (mL/kg) 3250 (30.9)   Total Intake(mL/kg) 3250 (30.9)   Urine (mL/kg/hr) 50   Total Output 50   Net +3200         PHYSICAL EXAMINATION: General:  Slightly jaundiced man in NAD Neuro:  Mild confusion, but appropriate and conversant HEENT:  Scleral icterus Neck: No JVD3 Cardiovascular:   Normal Lungs:  Normal Abdomen:  Obese Musculoskeletal:  No joint swelling Skin:  Numerous angiomata on face, nose.  LABS:  CBC  Recent Labs Lab 11/29/15 1554 11/01/2015 1912  WBC 11.6* 14.7*  HGB  --  10.6*  HCT 39.8 33.0*  PLT 155 188   Coag's  Recent Labs Lab 11/13/2015 2149  INR 3.25*   BMET  Recent Labs Lab 11/29/15 1554 11/13/2015 1912  NA 127* 133*  K 3.9 4.3  CL 86* 93*  CO2 26 13*  BUN 15 27*  CREATININE 0.94 2.88*  GLUCOSE 116* 41*   Electrolytes  Recent Labs Lab 11/29/15 1554 11/07/2015 1912  CALCIUM 8.4* 8.6*   Sepsis Markers  Recent Labs Lab 11/29/2015 2029  LATICACIDVEN >17.00*   ABG No results for input(s): PHART, PCO2ART, PO2ART in the last 168 hours. Liver Enzymes  Recent Labs Lab 11/29/15 1554 11/06/2015 1912  AST WILL FOLLOW  77* 121*  ALT WILL FOLLOW  37 <5*  ALKPHOS WILL FOLLOW  187* 104  BILITOT WILL FOLLOW  4.4* 7.6*  ALBUMIN WILL FOLLOW  2.5* 1.8*   Cardiac Enzymes  Recent Labs Lab 11/29/15 1554  PROBNP 224*   Glucose  Recent Labs Lab 11/22/2015 2012 11/17/2015 2036 11/18/2015 2039 11/14/2015 2138  GLUCAP 26* 23* 119* 100*    Imaging Dg Chest 2 View  11/29/2015  CLINICAL DATA:  Cough and congestion. Bilateral lower extremity edema. Initial encounter. EXAM: CHEST  2 VIEW COMPARISON:  None. FINDINGS: The lungs are clear. Heart size is normal. No pneumothorax or pleural effusion. No focal bony abnormality. IMPRESSION: Negative chest. Electronically Signed   By: Inge Rise M.D.   On: 11/29/2015 15:43   Dg Chest Port 1 View  11/25/2015  CLINICAL DATA:  SOB, tachycardia, altered mental status EXAM: PORTABLE CHEST 1 VIEW COMPARISON:  11/29/2015 FINDINGS: Midline trachea.  Normal heart size and mediastinal contours. Sharp costophrenic angles.  No pneumothorax.  Clear lungs. Mild right hemidiaphragm elevation. Numerous leads and wires project over the chest. IMPRESSION: No active disease. Electronically Signed   By: Abigail Miyamoto M.D.   On: 11/15/2015 22:53    EKG: Sinus rhythm CXR: low lung volumes  ASSESSMENT / PLAN:  Active Problems:   * No active hospital problems. *   PULMONARY A: Compensatory respiratory alkalosis P:   No other active issues  CARDIOVASCULAR A: Hypotension CAD Troponinemia P:   Will trend out trops and treat underlying process; likely infectious as well as GI Bleed. Holding anticoag given active bleed.  RENAL A: Acute renal failure Metabolic acidosis P:   Possible sepsis as underlying cause, probable GIB. Giving fluid & albumin challenge. Liver is unable to metabolize lactate presently  GASTROINTESTINAL A: Acute liver failure GIB P:   On PPI/octreotide. Will call GI in AM. Abd [place] including dopplers of portal vein pending. No evidence of  acute hepatitis given normal AST/ALT.  HEMATOLOGIC A: Anemia from GIB P:   Type and Screen active, s/p 1 U PRBC, but otherwise stable Hb.  INFECTIOUS A: Possible sepsis P:   Unclear source, cx pending. Tx w/ vanc+zosyn  ENDOCRINE A: No active issues P:    NEUROLOGIC A: Hepatic encpelopathy P:   Will treat sepsis,GIB, and try to maintain hepatic homeostasis  BEST PRACTICE / DISPOSITION Level of Care:  ICU Primary Service:  PCCM Consultants:  GI -> to call in AM Code Status:  Full Diet:  NPO DVT Px:  Holding GI Px:  On PPI Skin Integrity:  Intact Social / Family:  Updated on admission  TODAY'S SUMMARY: 50 y/o man with acute renal, liver injury and GIB.  I have personally obtained a history, examined the patient, evaluated laboratory and imaging results, formulated the assessment and plan and placed orders.  CRITICAL CARE: The patient is critically ill with multiple organ systems failure and requires high complexity decision making for assessment and support, frequent evaluation and titration of therapies, application of advanced monitoring technologies and extensive interpretation of multiple databases.  Critical Care Time devoted to patient care services described in this note is 120 minutes .   Luz Brazen, MD Pulmonary and [place] Pager: [phone]   11/06/2015, 11:56 PM      \

## 2015-11-30 NOTE — ED Provider Notes (Signed)
CSN: [id number]     Arrival date & time 11/22/2015  1831 History   First MD Initiated Contact with Patient 11/14/2015 2011     Chief Complaint  Patient presents with  . Leg Swelling  . Altered Mental Status    HPI Comments: 50 year old male with PmHx significant for alcoholism presents with altered mental status. His wife provides history. He saw his PCP yesterday who reports he came for an appt due to increasing lower leg edema. Earlier today he started to have nausea with coffee ground emesis followed by lethargy and altered mental status. His wife states he also looked more jaundiced than usual.    Patient is a 50 y.o. male presenting with altered mental status. The history is provided by the spouse. The history is limited by the condition of the patient.  Altered Mental Status   Past Medical History  Diagnosis Date  . Hypertension   . Hyperlipidemia   . Coronary artery disease   . Myocardial infarction (HCC)   . Depression     PT IS ALCOHOLIC- RECOVERING- COMPLETED 30 DAY FELLOWSHIP HALL REHAB APRIL 2014 - NO ALCOHOL SINCE  . Arthritis     SPINE  . HNP (herniated nucleus pulposus)     LUMBAR L5-S1- PAIN IS IN RT BUTTOCK AND DOWN RT LEG TO FEET- TINGLING IN FEET  . ETOH abuse    Past Surgical History  Procedure Laterality Date  . Cardiac catheterization    . Tonsillectomy      AS A CHILD  . Lumbar laminectomy/decompression microdiscectomy N/A 08/17/2013    Procedure: MICRO LUMBAR DECOMPRESSION L5-S1;  Surgeon: Javier DockerJeffrey C Beane, MD;  Location: [facility];  Service: Orthopedics;  Laterality: N/A;  . Vasectomy  2016   Family History  Problem Relation Age of Onset  . Healthy Mother   . Early death Father   . Heart disease Father    Social History  Substance Use Topics  . Smoking status: Former Smoker -- 1.00 packs/day for 15 years    Types: Cigarettes  . Smokeless tobacco: Current User    Types: Snuff     Comment: dips 1-2 cans per week  . Alcohol Use: Yes     Comment: 2  glasses of wine and 3 cans of beer per day    Review of Systems  Unable to perform ROS: Acuity of condition      Allergies  Review of patient's allergies indicates no known allergies.  Home Medications   Prior to Admission medications   Medication Sig Start Date End Date Taking? Authorizing Provider  furosemide (LASIX) 40 MG tablet Take 1 tablet (40 mg total) by mouth 2 (two) times daily. 11/29/15   Elenora GammaSamuel L Bradshaw, MD  metoprolol succinate (TOPROL-XL) 25 MG 24 hr tablet Take 1 tablet (25 mg total) by mouth daily. 11/29/15   Elenora GammaSamuel L Bradshaw, MD   BP 78/48 mmHg  Pulse 125  Temp(Src) 97.5 F (36.4 C) (Oral)  Resp 22  Ht 6' (1.829 m)  Wt 105.235 kg  BMI 31.46 kg/m2  SpO2 95%   Physical Exam  Constitutional: He appears lethargic. He appears distressed.  Obtunded, appears critically ill  HENT:  Head: Normocephalic and atraumatic.  Eyes: Pupils are equal, round, and reactive to light. Scleral icterus is present.  Neck: Normal range of motion.  Cardiovascular: Regular rhythm, S1 normal and S2 normal.  Tachycardia present.  Exam reveals no gallop and no friction rub.   No murmur heard. Pulmonary/Chest: No accessory  muscle usage. Tachypnea noted. No respiratory distress. He has no decreased breath sounds. He has no wheezes. He has no rhonchi. He has no rales.  Abdominal: He exhibits distension and fluid wave. There is no tenderness. There is no rebound and no guarding.  Musculoskeletal: He exhibits edema.  Pronounced lower leg edema  Neurological: He appears lethargic. GCS eye subscore is 2. GCS verbal subscore is 4. GCS motor subscore is 3.  Skin: He is diaphoretic. There is pallor.  Clammy, diffusely jaundiced    ED Course  Procedures (including critical care time) Labs Review Labs Reviewed  COMPREHENSIVE METABOLIC PANEL - Abnormal; Notable for the following:    Sodium 133 (*)    Chloride 93 (*)    CO2 13 (*)    Glucose, Bld 41 (*)    BUN 27 (*)    Creatinine, Ser  2.88 (*)    Calcium 8.6 (*)    Total Protein 5.1 (*)    Albumin 1.8 (*)    AST 121 (*)    ALT <5 (*)    Total Bilirubin 7.6 (*)    GFR calc non Af Amer 24 (*)    GFR calc Af Amer 28 (*)    Anion gap 27 (*)    All other components within normal limits  CBC - Abnormal; Notable for the following:    WBC 14.7 (*)    RBC 2.86 (*)    Hemoglobin 10.6 (*)    HCT 33.0 (*)    MCV 115.4 (*)    MCH 37.1 (*)    All other components within normal limits  URINALYSIS, ROUTINE W REFLEX MICROSCOPIC (NOT AT [facility]) - Abnormal; Notable for the following:    Color, Urine RED (*)    APPearance TURBID (*)    Hgb urine dipstick SMALL (*)    Bilirubin Urine MODERATE (*)    Ketones, ur 15 (*)    Protein, ur 100 (*)    Nitrite POSITIVE (*)    Leukocytes, UA SMALL (*)    All other components within normal limits  AMMONIA - Abnormal; Notable for the following:    Ammonia 93 (*)    All other components within normal limits  PROTIME-INR - Abnormal; Notable for the following:    Prothrombin Time 32.5 (*)    INR 3.25 (*)    All other components within normal limits  URINE MICROSCOPIC-ADD ON - Abnormal; Notable for the following:    Squamous Epithelial / LPF 0-5 (*)    Bacteria, UA FEW (*)    Casts GRANULAR CAST (*)    All other components within normal limits  I-STAT CG4 LACTIC ACID, ED - Abnormal; Notable for the following:    Lactic Acid, Venous >17.00 (*)    All other components within normal limits  CBG MONITORING, ED - Abnormal; Notable for the following:    Glucose-Capillary 26 (*)    All other components within normal limits  I-STAT TROPOININ, ED - Abnormal; Notable for the following:    Troponin i, poc 1.41 (*)    All other components within normal limits  CBG MONITORING, ED - Abnormal; Notable for the following:    Glucose-Capillary 23 (*)    All other components within normal limits  CBG MONITORING, ED - Abnormal; Notable for the following:    Glucose-Capillary 119 (*)    All other  components within normal limits  CBG MONITORING, ED - Abnormal; Notable for the following:    Glucose-Capillary 100 (*)  All other components within normal limits  URINE CULTURE  LIPASE, BLOOD  URINALYSIS, ROUTINE W REFLEX MICROSCOPIC (NOT AT [facility])  POC OCCULT BLOOD, ED  I-STAT CG4 LACTIC ACID, ED  TYPE AND SCREEN  ABO/RH  PREPARE RBC (CROSSMATCH)   Imaging Review Dg Chest 2 View  11/29/2015  CLINICAL DATA:  Cough and congestion. Bilateral lower extremity edema. Initial encounter. EXAM: CHEST  2 VIEW COMPARISON:  None. FINDINGS: The lungs are clear. Heart size is normal. No pneumothorax or pleural effusion. No focal bony abnormality. IMPRESSION: Negative chest. Electronically Signed   By: Drusilla Kanner M.D.   On: 11/29/2015 15:43   Dg Chest Port 1 View  11/14/2015  CLINICAL DATA:  SOB, tachycardia, altered mental status EXAM: PORTABLE CHEST 1 VIEW COMPARISON:  11/29/2015 FINDINGS: Midline trachea.  Normal heart size and mediastinal contours. Sharp costophrenic angles.  No pneumothorax.  Clear lungs. Mild right hemidiaphragm elevation. Numerous leads and wires project over the chest. IMPRESSION: No active disease. Electronically Signed   By: Jeronimo Greaves M.D.   On: 11/09/2015 22:53   I have personally reviewed and evaluated these images and lab results as part of my medical decision-making.   EKG Interpretation   Date/Time:  Friday November 30 2015 20:42:55 EDT Ventricular Rate:  104 PR Interval:  46 QRS Duration: 114 QT Interval:  399 QTC Calculation: 525 R Axis:   72 Text Interpretation:  Sinus tachycardia Borderline intraventricular  conduction delay Low voltage, precordial leads Repol abnrm suggests  ischemia, diffuse leads Prolonged QT interval Since last tracing st  elevation in 3 and ST depression in lateral leads present Confirmed by  MILLER  MD, BRIAN ([id number]) on 11/09/2015 9:03:52 PM      MDM   Final diagnoses:  MOSF (multiple organ systems failure)  NSTEMI  (non-ST elevated myocardial infarction) (HCC)  Acute liver failure  Acute renal failure with acute cortical necrosis (HCC)  Upper GI bleed  Anuria    50 year old male with multiorgan failure. It seems to be stemming from underlying acute liver failure. He presented with AMS, refractory hypoglycemia, hypotension, and jaundice. Shared visit with Dr. Hyacinth Meeker who placed 2 peripheral IVs. 3L NS were given here in ED as well as 2PRBCs Labs drawn here in the ED have shown:  Acute liver failure - Pt is jaundiced, has scleral icterus, and a fluid wave. He also has new onset coagulopathy (INR 3.25) and elevated ammonia. Lactulose was given. Consult to GI placed  Hypoglycemia - Likely due to his liver failure. D50 was given followed by D5W drip with some improvement  GIB - Hemoccult positive. Protonix drip started + Pepcid and Octreotide given.   Hypotension - 3L NS bolus given. Pt was also found to be anemic. 2 units of PRBCs given.  NSTEMI - Pt has an abnormal EKG with troponin 1.4. Cardiology consult placed  ARF- Creatinine of 2.88. UA obtained.  Lactic acidosis - Lactate of >17. Fluids given.   Hypothermia - Bair hugger applied  Consult placed to ICU  CRITICAL CARE Performed by: Bethel Born   Total critical care time: 30 minutes  Critical care time was exclusive of separately billable procedures and treating other patients.  Critical care was necessary to treat or prevent imminent or life-threatening deterioration.  Critical care was time spent personally by me on the following activities: development of treatment plan with patient and/or surrogate as well as nursing, discussions with consultants, evaluation of patient's response to treatment, examination of patient, obtaining history  from patient or surrogate, ordering and performing treatments and interventions, ordering and review of laboratory studies, ordering and review of radiographic studies, pulse oximetry and  re-evaluation of patient's condition.       Bethel Born, PA-C 11/06/2015 2349  Eber Hong, MD 12/17/2015 [phone]

## 2015-11-30 NOTE — ED Notes (Signed)
MD at bedside. 

## 2015-11-30 NOTE — Consult Note (Signed)
Referring Physician: ER - Dr. Hyacinth MeekerMiller  Reason for Consultation: Elevated Trop  HPI: 50 yo Caucasian man with alcoholism, prior rehabs for the same, CAD diagnosed in the setting of alcohol withdrawal in [place] in 2013, [facility] (please see below), not taking any of his cardiac meds for couple years, continues to drink alcohol, presents to ER today with worsening swelling of legs and abdomen for the past many weeks. No report of angina, abdominal pain. Reports poor appetite, chills, hematemesis for couple weeks, small amount of rectal bleeding, orthopnea. He was seen by his PCP Dr. Ermalinda MemosBradshaw yesterday who ordered Toprol 25 qd and Lasix 40 mg po bid. Denies syncope, palpitations.   In ER today noted to have hypotension, AKI, liver failure, possible sepsis, hypoglycemia and hypothermia. Trop 1.4. ECG sinus tachycardia, q waves in anterior and inf leads. One ECG today raises concern for some inf ST elevation. INR 3.2 (not on coumadin), lactic acid >17, wbc 14, Hb 10, TBil 7.6, albumin 1.8, AST 121, ALT <5, ammonia 93.     ========================================= [facility]  Dr. Ty HiltsKincaid 08/10/2012 - 08/12/2012 HPI: Cristal DeerChristopher is a 50 year old male with no known history, who transferred here for a surgical evaluation for a CABG. Patient was on a family trip and while driving home through [place] began to experience cold sweats, dizziness, and lightheadedness. Patient was unable to walk secondary to the dizziness and lightheadedness and had a fall. His wife drove him to the nearest emergency room in [place], [place]. Patient states they worked up for multiple issues. He stated that he was dehydrated and had a low Potassium. He also had a chemical stress test which was positive. He then had a heart catheterization and was reported he had severe 2 vessel disease. Patient said they were unable to place stents and he was going to need open heart  surgery. The patient refused and wanted to drive back to [place] to be evaluated for surgery. The OSH agreed to let him go if he wore a holter monitor on the drive home. Upon presentation he denies chest pain. He said leading up to admission to the hospital he had not been having chest pain or shortness of breath or palpitations.   TTE: 08/11/12  There is no comparison study available. The left ventricular size is normal. There is normal left ventricular wall thickness. Left ventricular systolic function is normal. LV ejection fraction = 55-60%. Left ventricular filling pattern is impaired. There are regional wall motion abnormalities as specified below. The right ventricle is normal in size and function. No significant valvular abnormalities There was insufficient TR detected to calculate RV systolic pressure. There is no pericardial effusion.   MR Cardiac Stress: 08/12/12  The left ventricle is normal in size. Left ventricular systolic function is  mildly reduced. There is base and mid-inferior wall hypokinesis and basal  inferior septal wall hypokinesis at rest. Left ventricular quantitative  ejection fraction: 45%. Myocardial perfusion is normal during stress and  rest imaging. There is > 50% subendocardial delayed Gadolinium  hyperenhancement in the base to mid-inferior and inferoseptal walls  consistent with prior RCA infarction. There is < 50% delayed Gadolinium  hyperenhcancement in the mid to distal anteroseptum suggestive of prior  infarction in the LAD territory. The right ventricle is normal in size.  Right ventricular systolic function is normal. Coarsening of interstitial  markings within bilateral lung bases suggestive of bronchiectasis. Small  left greater  than right pleural effusion. IMPRESSION: Mildly decreased  LV function Prior RCA infarction with little viability Other territories  were viable No inducible ischemia  CABG was not recommended. However he was instructed to  continue  Carvedilol 25 mg po bid, Lipitor 10 mg po qd, Lisinopril 10 mg po qd. He stopped his meds in 09/2012 due to feeling "awful" and postural dizziness. He was seen by Dr. Cristina Gong in [facility] cardiology on 09/15/2012  ===========================================    Review of Systems:  12 systems reviewed unremarkable except as noted in HPI    Past Medical History  Diagnosis Date  . Hypertension   . Hyperlipidemia   . Coronary artery disease   . Myocardial infarction (HCC)   . Depression     PT IS ALCOHOLIC- RECOVERING- COMPLETED 30 DAY FELLOWSHIP HALL REHAB APRIL 2014 - NO ALCOHOL SINCE  . Arthritis     SPINE  . HNP (herniated nucleus pulposus)     LUMBAR L5-S1- PAIN IS IN RT BUTTOCK AND DOWN RT LEG TO FEET- TINGLING IN FEET  . ETOH abuse    Home Meds:  None  (Not in a hospital admission)      Infusions: . famotidine (PEPCID) IV    . octreotide  (SANDOSTATIN)    IV infusion 50 mcg/hr (11/16/2015 2147)  . pantoprozole (PROTONIX) infusion 8 mg/hr (11/12/2015 2153)    No Known Allergies  Social History   Social History  . Marital Status: Married    Spouse Name: N/A  . Number of Children: N/A  . Years of Education: N/A   Occupational History  . Not on file.   Social History Main Topics  . Smoking status: Former Smoker -- 1.00 packs/day for 15 years    Types: Cigarettes  . Smokeless tobacco: Current User    Types: Snuff     Comment: dips 1-2 cans per week  . Alcohol Use: Yes     Comment: 2 glasses of wine and 3 cans of beer per day  . Drug Use: No  . Sexual Activity: No   Other Topics Concern  . Not on file   Social History Narrative    Family History  Problem Relation Age of Onset  . Healthy Mother   . Early death Father   . Heart disease Father     PHYSICAL EXAM: Filed Vitals:   11/03/2015 2315 11/11/2015 2339  BP: 84/59 90/60  Pulse: 112 100  Temp:  95.7 F (35.4 C)  Resp: 30 27     Intake/Output Summary (Last 24 hours) at 11/16/2015  2352 Last data filed at 11/02/2015 2252  Gross per 24 hour  Intake   3250 ml  Output     50 ml  Net   3200 ml    General:  Ill appearing. No respiratory difficulty in supine position, sats normal on RA  HEENT: normal Neck: supple. no JVD. Carotids 2+ bilat; no bruits. No lymphadenopathy or thryomegaly appreciated. Cor: PMI nondisplaced. Regular rate & rhythm. No rubs, gallops or murmurs. Lungs: basal crackles Abdomen: soft, nontender, distended.  Extremities: +3 pedal edema Neuro: alert & oriented x 2, cranial nerves grossly intact. moves all 4 extremities w/o difficulty.    Results for orders placed or performed during the hospital encounter of 11/01/2015 (from the past 24 hour(s))  Ammonia     Status: Abnormal   Collection Time: 11/16/2015  7:09 PM  Result Value Ref Range   Ammonia 93 (H) 9 - 35 umol/L  Lipase,  blood     Status: None   Collection Time: 11/02/2015  7:12 PM  Result Value Ref Range   Lipase 24 11 - 51 U/L  Comprehensive metabolic panel     Status: Abnormal   Collection Time: 11/02/2015  7:12 PM  Result Value Ref Range   Sodium 133 (L) 135 - 145 mmol/L   Potassium 4.3 3.5 - 5.1 mmol/L   Chloride 93 (L) 101 - 111 mmol/L   CO2 13 (L) 22 - 32 mmol/L   Glucose, Bld 41 (LL) 65 - 99 mg/dL   BUN 27 (H) 6 - 20 mg/dL   Creatinine, Ser 6.04 (H) 0.61 - 1.24 mg/dL   Calcium 8.6 (L) 8.9 - 10.3 mg/dL   Total Protein 5.1 (L) 6.5 - 8.1 g/dL   Albumin 1.8 (L) 3.5 - 5.0 g/dL   AST 540 (H) 15 - 41 U/L   ALT <5 (L) 17 - 63 U/L   Alkaline Phosphatase 104 38 - 126 U/L   Total Bilirubin 7.6 (H) 0.3 - 1.2 mg/dL   GFR calc non Af Amer 24 (L) >60 mL/min   GFR calc Af Amer 28 (L) >60 mL/min   Anion gap 27 (H) 5 - 15  CBC     Status: Abnormal   Collection Time: 11/29/2015  7:12 PM  Result Value Ref Range   WBC 14.7 (H) 4.0 - 10.5 K/uL   RBC 2.86 (L) 4.22 - 5.81 MIL/uL   Hemoglobin 10.6 (L) 13.0 - 17.0 g/dL   HCT 98.1 (L) 19.1 - 47.8 %   MCV 115.4 (H) 78.0 - 100.0 fL   MCH 37.1 (H) 26.0  - 34.0 pg   MCHC 32.1 30.0 - 36.0 g/dL   RDW 29.5 62.1 - 30.8 %   Platelets 188 150 - 400 K/uL  POC CBG, ED     Status: Abnormal   Collection Time: 11/17/2015  8:12 PM  Result Value Ref Range   Glucose-Capillary 26 (LL) 65 - 99 mg/dL   Comment 1 Notify RN    Comment 2 Document in Chart   I-Stat CG4 Lactic Acid, ED     Status: Abnormal   Collection Time: 11/09/2015  8:29 PM  Result Value Ref Range   Lactic Acid, Venous >17.00 (HH) 0.5 - 2.0 mmol/L   Comment NOTIFIED PHYSICIAN   CBG monitoring, ED     Status: Abnormal   Collection Time: 11/15/2015  8:36 PM  Result Value Ref Range   Glucose-Capillary 23 (LL) 65 - 99 mg/dL   Comment 1 Repeat Test   CBG monitoring, ED     Status: Abnormal   Collection Time: 11/21/2015  8:39 PM  Result Value Ref Range   Glucose-Capillary 119 (H) 65 - 99 mg/dL  Type and screen [facility]     Status: None (Preliminary result)   Collection Time: 11/26/2015  8:41 PM  Result Value Ref Range   ABO/RH(D) A POS    Antibody Screen NEG    Sample Expiration 12/03/2015    Unit Number [id number]    Blood Component Type RED CELLS,LR    Unit division 00    Status of Unit ALLOCATED    Transfusion Status OK TO TRANSFUSE    Crossmatch Result Compatible    Unit Number [id number]    Blood Component Type RED CELLS,LR    Unit division 00    Status of Unit ISSUED    Transfusion Status OK TO TRANSFUSE    Crossmatch Result Compatible  ABO/Rh     Status: None   Collection Time: 11/16/2015  8:41 PM  Result Value Ref Range   ABO/RH(D) A POS   POC CBG, ED     Status: Abnormal   Collection Time: 11/11/2015  9:38 PM  Result Value Ref Range   Glucose-Capillary 100 (H) 65 - 99 mg/dL  I-stat troponin, ED     Status: Abnormal   Collection Time: 11/16/2015  9:48 PM  Result Value Ref Range   Troponin i, poc 1.41 (HH) 0.00 - 0.08 ng/mL   Comment NOTIFIED PHYSICIAN    Comment 3          Protime-INR     Status: Abnormal   Collection Time: 11/19/2015  9:49 PM    Result Value Ref Range   Prothrombin Time 32.5 (H) 11.6 - 15.2 seconds   INR 3.25 (H) 0.00 - 1.49  Prepare RBC     Status: None   Collection Time: 10/31/2015 10:00 PM  Result Value Ref Range   Order Confirmation ORDER PROCESSED BY BLOOD BANK   Urinalysis, Routine w reflex microscopic (not at [facility])     Status: Abnormal   Collection Time: 11/07/2015 10:09 PM  Result Value Ref Range   Color, Urine RED (A) YELLOW   APPearance TURBID (A) CLEAR   Specific Gravity, Urine 1.024 1.005 - 1.030   pH 5.0 5.0 - 8.0   Glucose, UA NEGATIVE NEGATIVE mg/dL   Hgb urine dipstick SMALL (A) NEGATIVE   Bilirubin Urine MODERATE (A) NEGATIVE   Ketones, ur 15 (A) NEGATIVE mg/dL   Protein, ur 756 (A) NEGATIVE mg/dL   Nitrite POSITIVE (A) NEGATIVE   Leukocytes, UA SMALL (A) NEGATIVE  Urine microscopic-add on     Status: Abnormal   Collection Time: 11/12/2015 10:09 PM  Result Value Ref Range   Squamous Epithelial / LPF 0-5 (A) NONE SEEN   WBC, UA 0-5 0 - 5 WBC/hpf   RBC / HPF 0-5 0 - 5 RBC/hpf   Bacteria, UA FEW (A) NONE SEEN   Casts GRANULAR CAST (A) NEGATIVE   Dg Chest 2 View  11/29/2015  CLINICAL DATA:  Cough and congestion. Bilateral lower extremity edema. Initial encounter. EXAM: CHEST  2 VIEW COMPARISON:  None. FINDINGS: The lungs are clear. Heart size is normal. No pneumothorax or pleural effusion. No focal bony abnormality. IMPRESSION: Negative chest. Electronically Signed   By: Drusilla Kanner M.D.   On: 11/29/2015 15:43   Dg Chest Port 1 View  11/13/2015  CLINICAL DATA:  SOB, tachycardia, altered mental status EXAM: PORTABLE CHEST 1 VIEW COMPARISON:  11/29/2015 FINDINGS: Midline trachea.  Normal heart size and mediastinal contours. Sharp costophrenic angles.  No pneumothorax.  Clear lungs. Mild right hemidiaphragm elevation. Numerous leads and wires project over the chest. IMPRESSION: No active disease. Electronically Signed   By: Jeronimo Greaves M.D.   On: 11/27/2015 22:53     ASSESSMENT:  1.  Elevated Troponin - ACS cannot be completely excluded although sepsis, hypotension, AKI can result in elevated Trop - he does has history of CAD, MRI evidence of prior RCA and LAD territory infarct with no inducible ischemia in 08/2012  2. Active UGI bleeding (varices?), acute liver failure (high ammonia, INR 3.2), lactic acidosis, hypotension, possible sepsis, anasarca (cirrhosis, low albumin)    PLAN/DISCUSSION:  - Obtain medical records (cath, echo, stress test) from hospital in [place] from 2013 - Check echo  - Cannot give antiplatelets or anticoagulants given UGI bleeding (currently on Protonix drip, Octreotide  drip and getting RBC transfusion) - Defer management of acute medical issues to primary ICU team - Once BP is stable, consider IV Lasix for edema  Cardiology will follow   Nevin Bloodgood, MD

## 2015-11-30 NOTE — Addendum Note (Signed)
Addended by: Tamera PuntWRAY, WENDY S on: 28-Sep-2015 09:55 AM   Modules accepted: Orders

## 2015-11-30 NOTE — ED Notes (Addendum)
Pt sent here by pcp for "elevated enzymes and either kidney or liver failure".  Initially went to see pcp for LE and abdominal swelling x 2 weeks. Pt now c/o emesis and abdominal pain. Wife states pt is lethargic sometimes incoherent (starting today).  Last etoh 2 days ago.

## 2015-11-30 NOTE — ED Notes (Signed)
Lab called about a low glucose 41  edp notified

## 2015-12-01 ENCOUNTER — Inpatient Hospital Stay (HOSPITAL_COMMUNITY): Payer: 59

## 2015-12-01 ENCOUNTER — Encounter (HOSPITAL_COMMUNITY): Payer: Self-pay

## 2015-12-01 ENCOUNTER — Encounter (HOSPITAL_COMMUNITY): Payer: Self-pay | Admitting: Anesthesiology

## 2015-12-01 ENCOUNTER — Encounter (HOSPITAL_COMMUNITY): Admission: EM | Disposition: E | Payer: Self-pay | Source: Home / Self Care | Attending: Internal Medicine

## 2015-12-01 DIAGNOSIS — I2510 Atherosclerotic heart disease of native coronary artery without angina pectoris: Secondary | ICD-10-CM | POA: Diagnosis present

## 2015-12-01 DIAGNOSIS — E871 Hypo-osmolality and hyponatremia: Secondary | ICD-10-CM | POA: Diagnosis present

## 2015-12-01 DIAGNOSIS — K922 Gastrointestinal hemorrhage, unspecified: Secondary | ICD-10-CM

## 2015-12-01 DIAGNOSIS — J690 Pneumonitis due to inhalation of food and vomit: Secondary | ICD-10-CM | POA: Diagnosis not present

## 2015-12-01 DIAGNOSIS — J9601 Acute respiratory failure with hypoxia: Secondary | ICD-10-CM

## 2015-12-01 DIAGNOSIS — E874 Mixed disorder of acid-base balance: Secondary | ICD-10-CM | POA: Diagnosis present

## 2015-12-01 DIAGNOSIS — K7291 Hepatic failure, unspecified with coma: Secondary | ICD-10-CM | POA: Diagnosis not present

## 2015-12-01 DIAGNOSIS — R748 Abnormal levels of other serum enzymes: Secondary | ICD-10-CM | POA: Diagnosis not present

## 2015-12-01 DIAGNOSIS — E162 Hypoglycemia, unspecified: Secondary | ICD-10-CM | POA: Diagnosis not present

## 2015-12-01 DIAGNOSIS — R69 Illness, unspecified: Secondary | ICD-10-CM | POA: Diagnosis not present

## 2015-12-01 DIAGNOSIS — R34 Anuria and oliguria: Secondary | ICD-10-CM | POA: Diagnosis not present

## 2015-12-01 DIAGNOSIS — D62 Acute posthemorrhagic anemia: Secondary | ICD-10-CM | POA: Diagnosis present

## 2015-12-01 DIAGNOSIS — K449 Diaphragmatic hernia without obstruction or gangrene: Secondary | ICD-10-CM | POA: Diagnosis present

## 2015-12-01 DIAGNOSIS — E8770 Fluid overload, unspecified: Secondary | ICD-10-CM | POA: Diagnosis not present

## 2015-12-01 DIAGNOSIS — K652 Spontaneous bacterial peritonitis: Secondary | ICD-10-CM | POA: Diagnosis present

## 2015-12-01 DIAGNOSIS — R680 Hypothermia, not associated with low environmental temperature: Secondary | ICD-10-CM | POA: Diagnosis present

## 2015-12-01 DIAGNOSIS — K920 Hematemesis: Secondary | ICD-10-CM | POA: Diagnosis present

## 2015-12-01 DIAGNOSIS — K228 Other specified diseases of esophagus: Secondary | ICD-10-CM | POA: Diagnosis present

## 2015-12-01 DIAGNOSIS — E785 Hyperlipidemia, unspecified: Secondary | ICD-10-CM | POA: Diagnosis present

## 2015-12-01 DIAGNOSIS — K7200 Acute and subacute hepatic failure without coma: Secondary | ICD-10-CM | POA: Diagnosis not present

## 2015-12-01 DIAGNOSIS — R7989 Other specified abnormal findings of blood chemistry: Secondary | ICD-10-CM | POA: Diagnosis not present

## 2015-12-01 DIAGNOSIS — K7290 Hepatic failure, unspecified without coma: Secondary | ICD-10-CM | POA: Diagnosis present

## 2015-12-01 DIAGNOSIS — A419 Sepsis, unspecified organism: Secondary | ICD-10-CM | POA: Diagnosis present

## 2015-12-01 DIAGNOSIS — E860 Dehydration: Secondary | ICD-10-CM | POA: Diagnosis present

## 2015-12-01 DIAGNOSIS — I10 Essential (primary) hypertension: Secondary | ICD-10-CM | POA: Diagnosis present

## 2015-12-01 DIAGNOSIS — F1010 Alcohol abuse, uncomplicated: Secondary | ICD-10-CM | POA: Diagnosis not present

## 2015-12-01 DIAGNOSIS — R6521 Severe sepsis with septic shock: Secondary | ICD-10-CM | POA: Diagnosis present

## 2015-12-01 DIAGNOSIS — Z515 Encounter for palliative care: Secondary | ICD-10-CM | POA: Diagnosis not present

## 2015-12-01 DIAGNOSIS — Z6838 Body mass index (BMI) 38.0-38.9, adult: Secondary | ICD-10-CM | POA: Diagnosis not present

## 2015-12-01 DIAGNOSIS — R570 Cardiogenic shock: Secondary | ICD-10-CM | POA: Diagnosis present

## 2015-12-01 DIAGNOSIS — IMO0002 Reserved for concepts with insufficient information to code with codable children: Secondary | ICD-10-CM | POA: Diagnosis present

## 2015-12-01 DIAGNOSIS — K7031 Alcoholic cirrhosis of liver with ascites: Secondary | ICD-10-CM | POA: Diagnosis present

## 2015-12-01 DIAGNOSIS — R140 Abdominal distension (gaseous): Secondary | ICD-10-CM | POA: Diagnosis not present

## 2015-12-01 DIAGNOSIS — I252 Old myocardial infarction: Secondary | ICD-10-CM | POA: Diagnosis not present

## 2015-12-01 DIAGNOSIS — E876 Hypokalemia: Secondary | ICD-10-CM | POA: Diagnosis not present

## 2015-12-01 DIAGNOSIS — D696 Thrombocytopenia, unspecified: Secondary | ICD-10-CM | POA: Diagnosis not present

## 2015-12-01 DIAGNOSIS — K2210 Ulcer of esophagus without bleeding: Secondary | ICD-10-CM | POA: Diagnosis present

## 2015-12-01 DIAGNOSIS — K208 Other esophagitis: Secondary | ICD-10-CM | POA: Diagnosis present

## 2015-12-01 DIAGNOSIS — E669 Obesity, unspecified: Secondary | ICD-10-CM | POA: Diagnosis present

## 2015-12-01 DIAGNOSIS — E861 Hypovolemia: Secondary | ICD-10-CM | POA: Diagnosis not present

## 2015-12-01 DIAGNOSIS — Z66 Do not resuscitate: Secondary | ICD-10-CM | POA: Diagnosis not present

## 2015-12-01 DIAGNOSIS — T17990A Other foreign object in respiratory tract, part unspecified in causing asphyxiation, initial encounter: Secondary | ICD-10-CM | POA: Diagnosis not present

## 2015-12-01 DIAGNOSIS — R4182 Altered mental status, unspecified: Secondary | ICD-10-CM | POA: Diagnosis present

## 2015-12-01 DIAGNOSIS — N171 Acute kidney failure with acute cortical necrosis: Secondary | ICD-10-CM | POA: Diagnosis present

## 2015-12-01 DIAGNOSIS — Z79899 Other long term (current) drug therapy: Secondary | ICD-10-CM | POA: Diagnosis not present

## 2015-12-01 DIAGNOSIS — Z87891 Personal history of nicotine dependence: Secondary | ICD-10-CM | POA: Diagnosis not present

## 2015-12-01 DIAGNOSIS — D684 Acquired coagulation factor deficiency: Secondary | ICD-10-CM | POA: Diagnosis present

## 2015-12-01 DIAGNOSIS — I248 Other forms of acute ischemic heart disease: Secondary | ICD-10-CM | POA: Diagnosis present

## 2015-12-01 DIAGNOSIS — F10239 Alcohol dependence with withdrawal, unspecified: Secondary | ICD-10-CM | POA: Diagnosis present

## 2015-12-01 HISTORY — PX: ESOPHAGOGASTRODUODENOSCOPY: SHX5428

## 2015-12-01 LAB — COMPREHENSIVE METABOLIC PANEL
ALT: 58 U/L (ref 17–63)
AST: 231 U/L — AB (ref 15–41)
Albumin: 1.4 g/dL — ABNORMAL LOW (ref 3.5–5.0)
Alkaline Phosphatase: 85 U/L (ref 38–126)
Anion gap: 23 — ABNORMAL HIGH (ref 5–15)
BILIRUBIN TOTAL: 7.2 mg/dL — AB (ref 0.3–1.2)
BUN: 25 mg/dL — AB (ref 6–20)
CHLORIDE: 96 mmol/L — AB (ref 101–111)
CO2: 12 mmol/L — ABNORMAL LOW (ref 22–32)
CREATININE: 2.75 mg/dL — AB (ref 0.61–1.24)
Calcium: 7.7 mg/dL — ABNORMAL LOW (ref 8.9–10.3)
GFR calc Af Amer: 30 mL/min — ABNORMAL LOW (ref >60)
GFR, EST NON AFRICAN AMERICAN: 26 mL/min — AB (ref >60)
Glucose, Bld: 123 mg/dL — ABNORMAL HIGH (ref 65–99)
Potassium: 4.2 mmol/L (ref 3.5–5.1)
Sodium: 131 mmol/L — ABNORMAL LOW (ref 135–145)
Total Protein: 4 g/dL — ABNORMAL LOW (ref 6.5–8.1)

## 2015-12-01 LAB — PROTIME-INR
INR: 3.49 — ABNORMAL HIGH (ref 0.00–1.49)
INR: 2.24 — AB (ref 0.00–1.49)
Prothrombin Time: 34.3 seconds — ABNORMAL HIGH (ref 11.6–15.2)
Prothrombin Time: 24.6 seconds — ABNORMAL HIGH (ref 11.6–15.2)

## 2015-12-01 LAB — CBC
HCT: 22.9 % — ABNORMAL LOW (ref 39.0–52.0)
HEMATOCRIT: 24.6 % — AB (ref 39.0–52.0)
HEMATOCRIT: 28.1 % — AB (ref 39.0–52.0)
HEMOGLOBIN: 8.7 g/dL — AB (ref 13.0–17.0)
Hemoglobin: 9.4 g/dL — ABNORMAL LOW (ref 13.0–17.0)
Hemoglobin: 8.1 g/dL — ABNORMAL LOW (ref 13.0–17.0)
MCH: 37.9 pg — ABNORMAL HIGH (ref 26.0–34.0)
MCH: 36.8 pg — AB (ref 26.0–34.0)
MCH: 36.9 pg — AB (ref 26.0–34.0)
MCHC: 33.5 g/dL (ref 30.0–36.0)
MCHC: 35.4 g/dL (ref 30.0–36.0)
MCHC: 35.4 g/dL (ref 30.0–36.0)
MCV: 104.1 fL — AB (ref 78.0–100.0)
MCV: 104.2 fL — AB (ref 78.0–100.0)
MCV: 113.3 fL — AB (ref 78.0–100.0)
PLATELETS: 110 10*3/uL — AB (ref 150–400)
PLATELETS: 140 10*3/uL — AB (ref 150–400)
Platelets: 87 10*3/uL — ABNORMAL LOW (ref 150–400)
RBC: 2.2 MIL/uL — AB (ref 4.22–5.81)
RBC: 2.36 MIL/uL — ABNORMAL LOW (ref 4.22–5.81)
RBC: 2.48 MIL/uL — AB (ref 4.22–5.81)
RDW: 14.8 % (ref 11.5–15.5)
RDW: 20.5 % — ABNORMAL HIGH (ref 11.5–15.5)
RDW: 21.4 % — ABNORMAL HIGH (ref 11.5–15.5)
WBC: 13.6 10*3/uL — ABNORMAL HIGH (ref 4.0–10.5)
WBC: 13.7 10*3/uL — ABNORMAL HIGH (ref 4.0–10.5)
WBC: 17.2 10*3/uL — AB (ref 4.0–10.5)

## 2015-12-01 LAB — POCT I-STAT 3, ART BLOOD GAS (G3+)
Acid-base deficit: 3 mmol/L — ABNORMAL HIGH (ref 0.0–2.0)
Acid-base deficit: 2 mmol/L (ref 0.0–2.0)
Bicarbonate: 19.2 mEq/L — ABNORMAL LOW (ref 20.0–24.0)
Bicarbonate: 23.5 meq/L (ref 20.0–24.0)
O2 Saturation: 98 %
O2 Saturation: 100 %
PH ART: 7.497 — AB (ref 7.350–7.450)
TCO2: 20 mmol/L (ref 0–100)
TCO2: 25 mmol/L (ref 0–100)
pCO2 arterial: 24.7 mmHg — ABNORMAL LOW (ref 35.0–45.0)
pCO2 arterial: 40.5 mmHg (ref 35.0–45.0)
pH, Arterial: 7.371 (ref 7.350–7.450)
pO2, Arterial: 88 mmHg (ref 80.0–100.0)
pO2, Arterial: 265 mmHg — ABNORMAL HIGH (ref 80.0–100.0)

## 2015-12-01 LAB — RENAL FUNCTION PANEL
ANION GAP: 12 (ref 5–15)
Albumin: 2.2 g/dL — ABNORMAL LOW (ref 3.5–5.0)
Albumin: 2.5 g/dL — ABNORMAL LOW (ref 3.5–5.0)
Anion gap: 14 (ref 5–15)
BUN: 26 mg/dL — AB (ref 6–20)
BUN: 28 mg/dL — ABNORMAL HIGH (ref 6–20)
CALCIUM: 7.6 mg/dL — AB (ref 8.9–10.3)
CHLORIDE: 97 mmol/L — AB (ref 101–111)
CHLORIDE: 98 mmol/L — AB (ref 101–111)
CO2: 17 mmol/L — AB (ref 22–32)
CO2: 20 mmol/L — AB (ref 22–32)
CREATININE: 2.56 mg/dL — AB (ref 0.61–1.24)
Calcium: 8 mg/dL — ABNORMAL LOW (ref 8.9–10.3)
Creatinine, Ser: 2.64 mg/dL — ABNORMAL HIGH (ref 0.61–1.24)
GFR calc Af Amer: 33 mL/min — ABNORMAL LOW (ref >60)
GFR calc Af Amer: 31 mL/min — ABNORMAL LOW (ref >60)
GFR calc non Af Amer: 28 mL/min — ABNORMAL LOW (ref >60)
GFR calc non Af Amer: 27 mL/min — ABNORMAL LOW (ref >60)
GLUCOSE: 127 mg/dL — AB (ref 65–99)
Glucose, Bld: 117 mg/dL — ABNORMAL HIGH (ref 65–99)
PHOSPHORUS: 5.9 mg/dL — AB (ref 2.5–4.6)
POTASSIUM: 3.6 mmol/L (ref 3.5–5.1)
Phosphorus: 6.1 mg/dL — ABNORMAL HIGH (ref 2.5–4.6)
Potassium: 3.9 mmol/L (ref 3.5–5.1)
SODIUM: 128 mmol/L — AB (ref 135–145)
Sodium: 130 mmol/L — ABNORMAL LOW (ref 135–145)

## 2015-12-01 LAB — MRSA PCR SCREENING: MRSA by PCR: NEGATIVE

## 2015-12-01 LAB — GLUCOSE, CAPILLARY: Glucose-Capillary: 109 mg/dL — ABNORMAL HIGH (ref 65–99)

## 2015-12-01 LAB — CORTISOL: CORTISOL PLASMA: 43.1 ug/dL

## 2015-12-01 LAB — PHOSPHORUS: Phosphorus: 7.1 mg/dL — ABNORMAL HIGH (ref 2.5–4.6)

## 2015-12-01 LAB — I-STAT CG4 LACTIC ACID, ED: LACTIC ACID, VENOUS: 16.79 mmol/L — AB (ref 0.5–2.0)

## 2015-12-01 LAB — MAGNESIUM: MAGNESIUM: 1.9 mg/dL (ref 1.7–2.4)

## 2015-12-01 SURGERY — EGD (ESOPHAGOGASTRODUODENOSCOPY)
Anesthesia: General | Laterality: Left

## 2015-12-01 MED ORDER — HEPARIN SODIUM (PORCINE) 5000 UNIT/ML IJ SOLN: 5000.0000 [IU] | Freq: Three times a day (TID) | INTRAMUSCULAR | Status: DC

## 2015-12-01 MED ORDER — MIDAZOLAM HCL 2 MG/2ML IJ SOLN
2.0000 mg | Freq: Once | INTRAMUSCULAR | Status: AC
  Administered 2015-12-01: 2 mg via INTRAVENOUS

## 2015-12-01 MED ORDER — MIDAZOLAM HCL 2 MG/2ML IJ SOLN
INTRAMUSCULAR | Status: AC
  Administered 2015-12-01: 2 mg via INTRAVENOUS
  Filled 2015-12-01: qty 2

## 2015-12-01 MED ORDER — NOREPINEPHRINE BITARTRATE 1 MG/ML IV SOLN
2.0000 ug/min | INTRAVENOUS | Status: DC
  Administered 2015-12-01: 3 ug/min via INTRAVENOUS
  Administered 2015-12-01: 10 ug/min via INTRAVENOUS
  Administered 2015-12-01 – 2015-12-02 (×3): 30 ug/min via INTRAVENOUS
  Filled 2015-12-01 (×6): qty 4

## 2015-12-01 MED ORDER — MIDAZOLAM HCL 2 MG/2ML IJ SOLN
INTRAMUSCULAR | Status: AC
  Filled 2015-12-01: qty 4

## 2015-12-01 MED ORDER — SODIUM CHLORIDE 0.9 % IV SOLN: Freq: Once | INTRAVENOUS | Status: DC

## 2015-12-01 MED ORDER — FENTANYL CITRATE (PF) 100 MCG/2ML IJ SOLN
INTRAMUSCULAR | Status: AC
  Administered 2015-12-01: 100 ug via INTRAVENOUS
  Filled 2015-12-01: qty 2

## 2015-12-01 MED ORDER — DEXTROSE 5 % IV SOLN
10.0000 mg | Freq: Once | INTRAVENOUS | Status: AC
  Administered 2015-12-01: 10 mg via INTRAVENOUS
  Filled 2015-12-01: qty 1

## 2015-12-01 MED ORDER — SODIUM CHLORIDE 0.9 % IV SOLN: 250.0000 mL | INTRAVENOUS | Status: DC | PRN

## 2015-12-01 MED ORDER — FENTANYL CITRATE (PF) 100 MCG/2ML IJ SOLN
INTRAMUSCULAR | Status: AC
  Filled 2015-12-01: qty 4

## 2015-12-01 MED ORDER — SODIUM CHLORIDE 0.9 % IV SOLN
Freq: Once | INTRAVENOUS | Status: AC
  Administered 2015-12-01: 08:00:00 via INTRAVENOUS

## 2015-12-01 MED ORDER — SODIUM CHLORIDE 0.9 % IV SOLN
2.0000 mg/h | INTRAVENOUS | Status: DC
  Administered 2015-12-01 – 2015-12-04 (×4): 2 mg/h via INTRAVENOUS
  Filled 2015-12-01 (×5): qty 10

## 2015-12-01 MED ORDER — ROCURONIUM BROMIDE 50 MG/5ML IV SOLN
1.0000 mg/kg | Freq: Once | INTRAVENOUS | Status: AC
  Administered 2015-12-01: 50 mg via INTRAVENOUS
  Filled 2015-12-01: qty 10.8

## 2015-12-01 MED ORDER — FENTANYL CITRATE (PF) 100 MCG/2ML IJ SOLN
100.0000 ug | Freq: Once | INTRAMUSCULAR | Status: AC
  Administered 2015-12-01: 100 ug via INTRAVENOUS

## 2015-12-01 MED ORDER — MIDAZOLAM HCL 2 MG/2ML IJ SOLN
2.0000 mg | Freq: Once | INTRAMUSCULAR | Status: AC
Start: 2015-12-01 — End: 2015-12-01
  Administered 2015-12-01: 2 mg via INTRAVENOUS

## 2015-12-01 MED ORDER — ETOMIDATE 2 MG/ML IV SOLN
20.0000 mg | Freq: Once | INTRAVENOUS | Status: AC
  Administered 2015-12-01: 20 mg via INTRAVENOUS

## 2015-12-01 MED ORDER — MAGNESIUM SULFATE 2 GM/50ML IV SOLN
2.0000 g | Freq: Once | INTRAVENOUS | Status: AC
  Administered 2015-12-01: 2 g via INTRAVENOUS
  Filled 2015-12-01: qty 50

## 2015-12-01 MED ORDER — SODIUM CHLORIDE 0.9 % IV SOLN
INTRAVENOUS | Status: AC
  Administered 2015-12-01 – 2015-12-02 (×2): via INTRAVENOUS

## 2015-12-01 MED ORDER — MIDAZOLAM HCL 2 MG/2ML IJ SOLN
1.0000 mg | INTRAMUSCULAR | Status: DC | PRN
  Administered 2015-12-01 (×2): 2 mg via INTRAVENOUS
  Filled 2015-12-01 (×2): qty 2

## 2015-12-01 MED ORDER — PHENOL 1.4 % MT LIQD
1.0000 | OROMUCOSAL | Status: DC | PRN
  Filled 2015-12-01: qty 177

## 2015-12-01 MED ORDER — MIDAZOLAM HCL 5 MG/ML IJ SOLN
INTRAMUSCULAR | Status: AC
  Filled 2015-12-01: qty 2

## 2015-12-01 MED ORDER — SODIUM CHLORIDE 0.9 % IV SOLN
25.0000 ug/h | INTRAVENOUS | Status: DC
  Administered 2015-12-01: 50 ug/h via INTRAVENOUS
  Administered 2015-12-02: 125 ug/h via INTRAVENOUS
  Administered 2015-12-03: 100 ug/h via INTRAVENOUS
  Administered 2015-12-04 – 2015-12-05 (×3): 250 ug/h via INTRAVENOUS
  Administered 2015-12-06: 200 ug/h via INTRAVENOUS
  Administered 2015-12-06 (×2): 250 ug/h via INTRAVENOUS
  Filled 2015-12-01 (×11): qty 50

## 2015-12-01 MED ORDER — SODIUM CHLORIDE 0.9 % IV SOLN
INTRAVENOUS | Status: DC
Start: 2015-12-01 — End: 2015-12-01

## 2015-12-01 MED ORDER — ALBUMIN HUMAN 25 % IV SOLN
50.0000 g | Freq: Once | INTRAVENOUS | Status: AC
  Administered 2015-12-01: 50 g via INTRAVENOUS
  Filled 2015-12-01: qty 50

## 2015-12-01 MED ORDER — SODIUM CHLORIDE 0.9 % IV BOLUS (SEPSIS)
2000.0000 mL | Freq: Once | INTRAVENOUS | Status: AC
  Administered 2015-12-01: 2000 mL via INTRAVENOUS

## 2015-12-01 MED ORDER — PIPERACILLIN-TAZOBACTAM 3.375 G IVPB
3.3750 g | Freq: Three times a day (TID) | INTRAVENOUS | Status: DC
  Administered 2015-12-01 – 2015-12-07 (×18): 3.375 g via INTRAVENOUS
  Filled 2015-12-01 (×21): qty 50

## 2015-12-01 NOTE — Procedures (Signed)
Intubation Procedure Note Matthew Costa [id number] 05-Jan-1966  Procedure: Intubation Indications: Prior to bronchoscopy  Procedure Details Consent: Risks of procedure as well as the alternatives and risks of each were explained to the (patient/caregiver).  Consent for procedure obtained. Time Out: Verified patient identification, verified procedure, site/side was marked, verified correct patient position, special equipment/implants available, medications/allergies/relevent history reviewed, required imaging and test results available.  Performed  Maximum sterile technique was used including cap, gloves, gown, hand hygiene and mask.  MAC and 3    Evaluation Hemodynamic Status: BP stable throughout; O2 sats: stable throughout Patient's Current Condition: stable Complications: No apparent complications Patient did tolerate procedure well. Chest X-ray ordered to verify placement.  CXR: pending.  Pt intubated prior to EGD scope by GI MD. Pt with copious amount of hemoptysis in oral during intubation. Pt intubated using Glidescope #3 blade with 7.5 ett secured at 25 at the lip. Pt with Coarse Crackles/Rhonchi throughout. Pt suctioned through ETT with copious amount of hemoptysis removed. Pt stable throughout with no complications. RT will continue to monitor.    Matthew Costa 12/22/2015

## 2015-12-01 NOTE — Op Note (Signed)
[facility] Patient Name: Matthew Costa Procedure Date : 12/23/2015 MRN: [id number] Attending MD: Willis Modena , MD Date of Birth: 04/05/1966 CSN: [id number] Age: 50 Admit Type: Inpatient Procedure:                Upper GI endoscopy Indications:              Acute post hemorrhagic anemia, Hematemesis Providers:                Willis Modena, MD Referring MD:              Medicines:                General Anesthesia Complications:            No immediate complications. Estimated Blood Loss:     Estimated blood loss: 500 mL. Procedure:                Pre-Anesthesia Assessment:                           - Prior to the procedure, a History and Physical                            was performed, and patient medications and                            allergies were reviewed. The patient's tolerance of                            previous anesthesia was also reviewed. The risks                            and benefits of the procedure and the sedation                            options and risks were discussed with the patient.                            All questions were answered, and informed consent                            was obtained. Prior Anticoagulants: The patient has                            taken no previous anticoagulant or antiplatelet                            agents. ASA Grade Assessment: IV - A patient with                            severe systemic disease that is a constant threat                            to life. After reviewing the risks and benefits,  the patient was deemed in satisfactory condition to                            undergo the procedure.                           After obtaining informed consent, the endoscope was                            passed under direct vision. Throughout the                            procedure, the patient's blood pressure, pulse, and                            oxygen  saturations were monitored continuously. The                            [id number] ([id number]) scope was introduced through the                            mouth, and advanced to the second part of duodenum.                            The upper GI endoscopy was accomplished without                            difficulty. The patient tolerated the procedure                            well. Findings:      Diffuse severe mucosal changes characterized by discoloration, erosion,       hemorrhagic appearance, sloughing, a decreased vascular pattern and       ulceration were found in the upper third of the esophagus, in the middle       third of the esophagus and in the lower third of the esophagus. There       were also deep serpiginous ulcers superimposed on the inflammation, with       a couple large vascular protuberances, attempted therapy of which, given       patient's already critical illness and lack of "normal" neighboring       tissue around which to anchor a clip, I felt would precipitate further       bleeding and likely patient demise. No esophageal varices were seen.       Overall appearance consistent with severe necrosis.      A medium-sized hiatal hernia was present.      Patchy moderate mucosal changes characterized by discoloration and a       decreased vascular pattern were found in the gastric body and in the       gastric antrum. Appearance most consistent with mild-to-moderate       necrosis.      Red blood was found in the cardia, in the gastric fundus and in the       gastric body. Views of these areas were obscured, but retroflexion into  the cardia showed no obvious evidence of gastric varices.      The exam of the stomach was otherwise normal.      The duodenal bulb, first portion of the duodenum and second portion of       the duodenum were normal. Impression:               - Discolored, eroded, hemorrhagic appearing,                            decreased vascular  pattern, ulcerated mucosa in the                            esophagus. Overall appearance most consistent with                            severe necrosis; potential etiologies could include                            ischemic necrosis versus caustic ingestion.                           - Medium-sized hiatal hernia.                           - Discolored and decreased vascular pattern mucosa                            in the gastric body and antrum. Appearance most                            consistent with mild-to-moderate necrosis, similar                            potential etiologies as above.                           - Red blood in the cardia, in the gastric fundus                            and in the gastric body. Views of the proximal                            stomach were accordingly compromised.                           - Normal duodenal bulb, first portion of the                            duodenum and second portion of the duodenum. Moderate Sedation:      fentanyl drip (ventilated patient) Recommendation:           - Return patient to ICU for ongoing care.                           - Continue aggressive correction of coagulopathy.                           -  PPI, pressors.                           - Stop Octreotide (no varices seen).                           - NPO until further notice.                           - No nasogastric or orogastric tube.                           - Prognosis guarded.                           - If rebleeding occurs, unfortunately there is                            little else that can be done endoscopically.                           Deboraha Sprang- [facility] will follow. Procedure Code(s):        --- Professional ---                           [phone], Esophagogastroduodenoscopy, flexible,                            transoral; diagnostic, including collection of                            specimen(s) by brushing or washing, when performed                             (separate procedure) Diagnosis Code(s):        --- Professional ---                           K22.8, Other specified diseases of esophagus                           K22.10, Ulcer of esophagus without bleeding                           K31.89, Other diseases of stomach and duodenum                           K44.9, Diaphragmatic hernia without obstruction or                            gangrene                           K92.2, Gastrointestinal hemorrhage, unspecified                           D62, Acute posthemorrhagic anemia  K92.0, Hematemesis CPT copyright 2016 [employer]. All rights reserved. The codes documented in this report are preliminary and upon coder review may  be revised to meet current compliance requirements. Willis Modena, MD Willis Modena, MD Dec 04, 2015 8:38:48 PM This report has been signed electronically. Number of Addenda: 0

## 2015-12-01 NOTE — Procedures (Signed)
Intubation Procedure Note Druscilla BrownieChristopher E Direnzo [id number] [birth year]  Procedure: Intubation Indications: Respiratory insufficiency  Procedure Details Consent: Risks of procedure as well as the alternatives and risks of each were explained to the (patient/caregiver).  Consent for procedure obtained. Time Out: Verified patient identification, verified procedure, site/side was marked, verified correct patient position, special equipment/implants available, medications/allergies/relevent history reviewed, required imaging and test results available.  Performed  Maximum sterile technique was used including antiseptics, gloves, gown, hand hygiene and mask.  MAC    Evaluation Hemodynamic Status: BP stable throughout; O2 sats: stable throughout Patient's Current Condition: stable Complications: No apparent complications Patient did tolerate procedure well. Chest X-ray ordered to verify placement.  CXR: pending.   Koren BoundYACOUB,Delisha Peaden 12/28/2015

## 2015-12-01 NOTE — Progress Notes (Signed)
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  Versed prn not holding.  eICU Interventions  Versed gtt started     Intervention Category Minor Interventions: Agitation / anxiety - evaluation and management  Henry RusselSMITH, Madeleine Fenn, P 12/02/2015, 9:23 PM

## 2015-12-01 NOTE — Progress Notes (Signed)
Consult note from Dr. Tarri GlennAzeem.   Agree with plan. Suspect POC troponin elevated due to demand ischemia. Not cath candidate with active bleed, advanced CKD and shock.   Will check echo. Trend troponin.   Edema likely due in part to cirrhosis and low albumin (2.2).   Donyae Kilner,MD 1:22 PM

## 2015-12-01 NOTE — Consult Note (Signed)
[facility] Gastroenterology Consultation Note  Referring Provider: Dr. Jamie Kato    Primary Care Physician:  Kevin Fenton, MD  Reason for Consultation:  Liver failure, hematemesis  HPI: Matthew Costa is a 50 y.o. male admitted to hospital for confusion and lower extremity edema.  Found to have multiple problems:  Coagulopathy, anemia, acute renal failure.  Has history of alcohol abuse, but denies prior history of liver disease.  He was confused upon arrival yesterday, but can answer most questions appropriately today.  Notes some hematemesis, scant, over the past few days, but denies melena or hematochezia.  Continues to drink alcohol regularly.  Has history of significant cardiac disease, per notes there is possible need for cardiac surgery.  He endorses some abdominal distention, but denies abdominal pain.   Past Medical History  Diagnosis Date  . Hypertension   . Hyperlipidemia   . Coronary artery disease   . Myocardial infarction (HCC)   . Depression     PT IS ALCOHOLIC- RECOVERING- COMPLETED 30 DAY FELLOWSHIP HALL REHAB APRIL 2014 - NO ALCOHOL SINCE  . Arthritis     SPINE  . HNP (herniated nucleus pulposus)     LUMBAR L5-S1- PAIN IS IN RT BUTTOCK AND DOWN RT LEG TO FEET- TINGLING IN FEET  . ETOH abuse     Past Surgical History  Procedure Laterality Date  . Cardiac catheterization    . Tonsillectomy      AS A CHILD  . Lumbar laminectomy/decompression microdiscectomy N/A 08/17/2013    Procedure: MICRO LUMBAR DECOMPRESSION L5-S1;  Surgeon: Javier Docker, MD;  Location: [facility] ORS;  Service: Orthopedics;  Laterality: N/A;  . Vasectomy  2016    Prior to Admission medications   Medication Sig Start Date End Date Taking? Authorizing Provider  dextromethorphan (DELSYM) 30 MG/5ML liquid Take 60 mg by mouth as needed for cough.   Yes Historical Provider, MD  furosemide (LASIX) 40 MG tablet Take 1 tablet (40 mg total) by mouth 2 (two) times daily. 11/29/15  Yes Elenora Gamma, MD  ibuprofen (ADVIL,MOTRIN) 200 MG tablet Take 400 mg by mouth every 6 (six) hours as needed.   Yes Historical Provider, MD  metoprolol succinate (TOPROL-XL) 25 MG 24 hr tablet Take 1 tablet (25 mg total) by mouth daily. 11/29/15   Elenora Gamma, MD    Current Facility-Administered Medications  Medication Dose Route Frequency Provider Last Rate Last Dose  . 0.9 %  sodium chloride infusion  250 mL Intravenous PRN Jamie Kato, MD      . 0.9 %  sodium chloride infusion   Intravenous Once Simonne Martinet, NP      . norepinephrine (LEVOPHED) 4 mg in dextrose 5 % 250 mL (0.016 mg/mL) infusion  2-50 mcg/min Intravenous Continuous Simonne Martinet, NP      . octreotide (SANDOSTATIN) 500 mcg in sodium chloride 0.9 % 250 mL (2 mcg/mL) infusion  50 mcg/hr Intravenous Continuous Eber Hong, MD 25 mL/hr at 12/27/2015 0504 50 mcg/hr at 12/04/2015 0504  . pantoprazole (PROTONIX) 80 mg in sodium chloride 0.9 % 250 mL (0.32 mg/mL) infusion  8 mg/hr Intravenous Continuous Bethel Born, PA-C 25 mL/hr at 2015/12/14 2153 8 mg/hr at 14-Dec-2015 2153  . phenol (CHLORASEPTIC) mouth spray 1 spray  1 spray Mouth/Throat PRN Jamie Kato, MD      . phytonadione (VITAMIN K) 10 mg in dextrose 5 % 50 mL IVPB  10 mg Intravenous Once Simonne Martinet, NP      .  piperacillin-tazobactam (ZOSYN) IVPB 3.375 g  3.375 g Intravenous 3 times per day Eber Hong, MD   3.375 g at 12/06/2015 0445    Allergies as of 11/01/2015  . (No Known Allergies)    Family History  Problem Relation Age of Onset  . Healthy Mother   . Early death Father   . Heart disease Father     Social History   Social History  . Marital Status: Married    Spouse Name: N/A  . Number of Children: N/A  . Years of Education: N/A   Occupational History  . Not on file.   Social History Main Topics  . Smoking status: Former Smoker -- 1.00 packs/day for 15 years    Types: Cigarettes  . Smokeless tobacco: Current User    Types: Snuff      Comment: dips 1-2 cans per week  . Alcohol Use: Yes     Comment: 2 glasses of wine and 3 cans of beer per day  . Drug Use: No  . Sexual Activity: No   Other Topics Concern  . Not on file   Social History Narrative    Review of Systems: Positive = bold Gen: Denies any fever, chills, rigors, night sweats, anorexia, fatigue, weakness, malaise, involuntary weight loss, and sleep disorder CV: Denies chest pain, angina, palpitations, syncope, orthopnea, PND, peripheral edema, and claudication. Resp: Denies dyspnea, cough, sputum, wheezing, coughing up blood. GI: Described in detail in HPI.    GU : Denies urinary burning, blood in urine, urinary frequency, urinary hesitancy, nocturnal urination, and urinary incontinence. MS: Denies joint pain or swelling.  Denies muscle weakness, cramps, atrophy.  Derm: Denies rash, itching, oral ulcerations, hives, unhealing ulcers.  Psych: Denies depression, anxiety, memory loss, suicidal ideation, hallucinations,  and confusion. Heme: Denies bruising, bleeding, and enlarged lymph nodes. Neuro:  Denies any headaches,B, paresthesias. Endo:  Denies any problems with DM, thyroid, adrenal function.  Physical Exam: Vital signs in last 24 hours: Temp:  [95.7 F (35.4 C)-99.6 F (37.6 C)] 98.1 F (36.7 C) (04/01 0905) Pulse Rate:  [49-125] 108 (04/01 0815) Resp:  [19-35] 21 (04/01 0815) BP: (59-123)/(33-87) 77/52 mmHg (04/01 0815) SpO2:  [90 %-100 %] 96 % (04/01 0815) Weight:  [104.781 kg (231 lb)-108 kg (238 lb 1.6 oz)] 108 kg (238 lb 1.6 oz) (04/01 0500)   General:   Alert, overweight, more alert and can answer questions appropriately, much older-appearing than stated age Head:  Normocephalic and atraumatic. Eyes:  Sclera clear, no icterus.   Conjunctiva pale Ears:  Normal auditory acuity. Nose:  Nasogastric tube in place (few hundred mLs bloody effluent) No deformity, discharge,  or lesions. Mouth:  No deformity or lesions.  Oropharynx pale and  dry Neck:  Supple; no masses or thyromegaly. Lungs:  Clear throughout to auscultation.   No wheezes, crackles, or rhonchi. No acute distress. Heart:  Regular rate and rhythm; no murmurs, clicks, rubs,  or gallops. Abdomen:  Soft, distended, small umbilical hernia, non-tender. No masses, hepatosplenomegaly or hernias noted. Hypoactive bowel sounds, without guarding, and without rebound.     Msk:  Symmetrical without gross deformities. Normal posture. Pulses:  Normal pulses noted. Extremities:  3-4+ edema bilateral lower extremites. Neurologic:  Alert and  oriented x4;  Diffusely weak, tremulous, no lateralizing signs Skin:  Extensive ecchymoses and telangiectasias; otherwise intact without significant lesions or rashes. Cervical Nodes:  No significant cervical adenopathy. Psych:  Alert and cooperative. Normal mood and affect.   Lab Results:  Recent Labs  11/29/15 1554 11/26/2015 1912 12/20/2015 0050  WBC 11.6* 14.7* 17.2*  HGB  --  10.6* 9.4*  HCT 39.8 33.0* 28.1*  PLT 155 188 140*   BMET  Recent Labs  11/29/15 1554 11/15/2015 1912 12/20/2015 0043  NA 127* 133* 131*  K 3.9 4.3 4.2  CL 86* 93* 96*  CO2 26 13* 12*  GLUCOSE 116* 41* 123*  BUN 15 27* 25*  CREATININE 0.94 2.88* 2.75*  CALCIUM 8.4* 8.6* 7.7*   LFT  Recent Labs  11/29/15 1554  12-20-15 0043  PROT 6.0  6.0  < > 4.0*  ALBUMIN 2.6*  2.5*  < > 1.4*  AST 73*  77*  < > 231*  ALT 31  37  < > 58  ALKPHOS 189*  187*  < > 85  BILITOT 4.1*  4.4*  < > 7.2*  BILIDIR 2.14*  --   --   < > = values in this interval not displayed. PT/INR  Recent Labs  11/05/2015 2149  LABPROT 32.5*  INR 3.25*    Studies/Results: Dg Chest 2 View  11/29/2015  CLINICAL DATA:  Cough and congestion. Bilateral lower extremity edema. Initial encounter. EXAM: CHEST  2 VIEW COMPARISON:  None. FINDINGS: The lungs are clear. Heart size is normal. No pneumothorax or pleural effusion. No focal bony abnormality. IMPRESSION: Negative chest.  Electronically Signed   By: Drusilla Kanner M.D.   On: 11/29/2015 15:43   Dg Chest Port 1 View  11/04/2015  CLINICAL DATA:  SOB, tachycardia, altered mental status EXAM: PORTABLE CHEST 1 VIEW COMPARISON:  11/29/2015 FINDINGS: Midline trachea.  Normal heart size and mediastinal contours. Sharp costophrenic angles.  No pneumothorax.  Clear lungs. Mild right hemidiaphragm elevation. Numerous leads and wires project over the chest. IMPRESSION: No active disease. Electronically Signed   By: Jeronimo Greaves M.D.   On: 11/18/2015 22:53   Impression:  1.  Confusion.  Suspect hepatic encephalopathy. 2.  Elevated LFTs.  Suspect alcoholic mediated hepatitis +/- cirrhosis. 3.  Acute renal failure.  Possibly hepatorenal syndrome. 4.  Liver failure, manifested by encephalopathy (improving) and coagulopathy. 5.  Alcohol abuse. 6.  Bloody emesis, mild in volume.  Not rampant or destabilizing. 7.  Significant cardiac disease by report.  Plan:  1.  Protonix drip and octreotide drip. 2.  Correct coagulopathy with FFP and Vitamin K. 3.  Abdominal ultrasound to assess for ascites and assess liver echotexture and assess for any biliary ductal dilatation. 4.  Hopefully bloody emesis will improve with correction of coagulopathy and medical therapy in #1 above.  If not, may have to consider endoscopy, but this would likely require intubation which neither primary team nor I would like to pursue given his recent encephalopathy unless he has rampant destabilizing bleeding despite maximal medical therapy and correction of coagulopathy. 5.  Not candidate for liver transplant given (1) his ongoing alcohol abuse and (2) his significant cardiac disease. 6.  Lactulose as tolerated for confusion. 7.  Continue empiric antibiotics. 8.  MELD = 37 (severe; and if he has cirrhosis); discriminant function = 87 (severe) ; Prognosis guarded.  Not candidate for steroids given concern of possible superimposed infection.  Don't see how  Trental will help much at the present time. 9.  Consider nephrology consultation if creatinine doesn't improve with volume replacement. 10. Watch for alcohol withdrawal. 11. Baby ASA ok for heart disease, if felt absolutely necessary, but would not use any other anticoagulants or antiplatelet agents at the present time. 12. [facility] will follow.   LOS: 0 days   Brennah Quraishi M  12/30/2015, 10:16 AM  Pager [phone] If no answer or after 5 PM call [phone]

## 2015-12-01 NOTE — Progress Notes (Addendum)
PULMONARY / CRITICAL CARE MEDICINE HISTORY AND PHYSICAL EXAMINATION   Name: Matthew Costa MRN: [id number] DOB: 1966-06-05    ADMISSION DATE:  12-03-2015  PRIMARY SERVICE: PCCM  CHIEF COMPLAINT:  Swelling, confusion  BRIEF PATIENT DESCRIPTION: 50 y/o man with hx of EtOH abuse presenting with jaundice, swelling, and confusion  SIGNIFICANT EVENTS / STUDIES:  3/1- hypotensive.   LINES / TUBES: - PIV x3 R IJ TLC 4/1>>>  CULTURES: Blood x2 (3/31)  ANTIBIOTICS: Vanc 3/31 >> Zosyn 3/31 >>  SUBJECTIVE:  Vomiting coffee gd colored emesis   VITAL SIGNS: Temp:  [95.7 F (35.4 C)-99.6 F (37.6 C)] 98.1 F (36.7 C) (04/01 0905) Pulse Rate:  [49-125] 108 (04/01 0815) Resp:  [19-35] 21 (04/01 0815) BP: (59-123)/(33-87) 77/52 mmHg (04/01 0815) SpO2:  [90 %-100 %] 96 % (04/01 0815) Weight:  [231 lb (104.781 kg)-238 lb 1.6 oz (108 kg)] 238 lb 1.6 oz (108 kg) (04/01 0500) HEMODYNAMICS:   VENTILATOR SETTINGS:   INTAKE / OUTPUT: Intake/Output      03/31 0701 - 04/01 0700 04/01 0701 - 04/02 0700   I.V. (mL/kg) 4002.5 (37.1)    Blood  335   IV Piggyback 1575    Total Intake(mL/kg) 5577.5 (51.6) 335 (3.1)   Urine (mL/kg/hr) 50    Emesis/NG output 300    Total Output 350     Net +5227.5 +335        Urine Occurrence 100 x    Emesis Occurrence 300 x     PHYSICAL EXAMINATION: General:  Slightly jaundiced male. Uncomfortable and coughing continuously  Neuro:  Mild confusion, but appropriate and conversant HEENT:  Scleral icterus, MMM, blood around mouth and nose  Neck: No JVD.  Cardiovascular:  Normal, tachy  Lungs: decreased, no accessory use  Abdomen:  Obese, soft, no pain. + ascites.  Musculoskeletal:  No joint swelling; equal st  Skin:  Numerous angiomata on face, nose. + anasarca.   LABS:  CBC  Recent Labs Lab 11/29/15 1554 03-Dec-2015 1912 12/08/2015 0050  WBC 11.6* 14.7* 17.2*  HGB  --  10.6* 9.4*  HCT 39.8 33.0* 28.1*  PLT 155 188 140*    Coag's  Recent Labs Lab 2015-12-03 2149  INR 3.25*   BMET  Recent Labs Lab 11/29/15 1554 12/03/15 1912 12/05/2015 0043  NA 127* 133* 131*  K 3.9 4.3 4.2  CL 86* 93* 96*  CO2 26 13* 12*  BUN 15 27* 25*  CREATININE 0.94 2.88* 2.75*  GLUCOSE 116* 41* 123*   Electrolytes  Recent Labs Lab 11/29/15 1554 2015-12-03 1912 12/22/2015 0043  CALCIUM 8.4* 8.6* 7.7*  MG  --   --  1.9  PHOS  --   --  7.1*   Sepsis Markers  Recent Labs Lab 12/03/15 2029 12/25/2015 0128  LATICACIDVEN >17.00* 16.79*   ABG No results for input(s): PHART, PCO2ART, PO2ART in the last 168 hours. Liver Enzymes  Recent Labs Lab 11/29/15 1554 Dec 03, 2015 1912 12/23/2015 0043  AST 73*  77* 121* 231*  ALT 31  37 <5* 58  ALKPHOS 189*  187* 104 85  BILITOT 4.1*  4.4* 7.6* 7.2*  ALBUMIN 2.6*  2.5* 1.8* 1.4*   Cardiac Enzymes  Recent Labs Lab 11/29/15 1554  PROBNP 224*   Glucose  Recent Labs Lab 2015-12-03 2012 12-03-15 2036 12/03/2015 2039 12/03/15 2138 12/17/2015 0354  GLUCAP 26* 23* 119* 100* 109*    Imaging Dg Chest 2 View  11/29/2015  CLINICAL DATA:  Cough and congestion. Bilateral lower extremity  edema. Initial encounter. EXAM: CHEST  2 VIEW COMPARISON:  None. FINDINGS: The lungs are clear. Heart size is normal. No pneumothorax or pleural effusion. No focal bony abnormality. IMPRESSION: Negative chest. Electronically Signed   By: Drusilla Kannerhomas  Dalessio M.D.   On: 11/29/2015 15:43   Dg Chest Port 1 View  11/08/2015  CLINICAL DATA:  SOB, tachycardia, altered mental status EXAM: PORTABLE CHEST 1 VIEW COMPARISON:  11/29/2015 FINDINGS: Midline trachea.  Normal heart size and mediastinal contours. Sharp costophrenic angles.  No pneumothorax.  Clear lungs. Mild right hemidiaphragm elevation. Numerous leads and wires project over the chest. IMPRESSION: No active disease. Electronically Signed   By: Jeronimo GreavesKyle  Talbot M.D.   On: 11/13/2015 22:53    EKG: Sinus rhythm CXR: low lung volumes  ASSESSMENT /  PLAN:  Active Problems:   Multisystem organ failure   PULMONARY A: Concern for aspiration  High risk for acute respiratory failure  P:   NPO Repeat CXR  Repeat abg FIo2 as needed. Will speak w/ GI. May be best to intubate   CARDIOVASCULAR A: Circulatory shock. Not sure if this is hypovolemia OR cardiogenic in setting of acidosis related cardiac suppression CAD Troponinemia-->suspect demand ischemia  P:   Needs central venous assessment MAP goal > 65 CVP goal 8-12 Ck cortisol  Holding anticoag given active bleed.  RENAL A: Acute renal failure: volume depletion, diuretics +/- NSAIDS Metabolic acidosis + AG in setting of lactic acidosis  P:   Assure adequate volume status Hold diuretics and antihypertensives MAP goal >65 Liver is unable to metabolize lactate presently-->will d/c further lactate trends.    GASTROINTESTINAL A: Acute liver failure UGIB-->worried about variceal bleed  Ascites  P:   On PPI/octreotide. Will call GI in AM.  Abd US including dopplers of portal vein pending.  NPO Will call GI   HEMATOLOGIC A: Anemia from GIB s/p x-fusion PRBCs.  Coagulopathy in setting of liver dz  Mild thrombocytopenia  P:   Transfuse FFP Serial cbc and coags Transfuse for hgb <7 OR for acute hgb changes in setting of acute bleeding   INFECTIOUS A: Sepsis.  ? SBP ? Aspiration PNA has developed cough since admit  P:   Unclear source, cx pending. Tx w/ vanc+zosyn Repeat CXR today  Will do diagnostic paracentesis later today   ENDOCRINE A: No active issues P:   Trend glucose   NEUROLOGIC A: Hepatic encpelopathy P:   Will cont to treat sepsis,GIB, and try to maintain hepatic homeostasis Will eventually need lactulose but in setting of acute GIB will hold off   BEST PRACTICE / DISPOSITION Level of Care:  ICU Primary Service:  PCCM Consultants:  GI -> to call in AM Code Status:  Full Diet:  NPO DVT Px:  Holding GI Px:  On PPI Skin Integrity:   Intact Social / Family:  Updated on admission  TODAY'S SUMMARY:   Matthew Costa is a 50 y/o man with a hx of EtOH abuse as well as coronary disease (prior NSTEMI). Admitted 3/31 w/ Lower extremity edema, as well as confusion, and jaundice. His wife brought him to the ED for evaluation. Admitted w/ UGIB, ABLA, AKI and acute liver failure. Concerned about acute alcoholic hepatitis w/ now UGIB. Not sure if his shock is from cardiac suppression or on-going bleed. Will cont PPI gtt and octreotide. Correct coagulopathy, transfuse as needed. Will cont current abx, later today will sample ascites. Have called GI. Probably will need EGD later today. He is critically ill.  Simonne Martinet ACNP-BC [facility] Pulmonary/Critical Care Pager # [phone] OR # [phone] if no answer Dec 27, 2015, 9:10 AM  Attending Note:  50 year old male with etoh history who presents to the hospital with UGI bleeding.  Patient is protecting his airway well but concern remains for persistent upper GI bleed.  GI has been consulted.  Will address coagulopathy and once that is addressed GI will perform EGD.  On exam, lungs with decreased air movement, bibasilar crackles.  CXR I reviewed myself, bibasilar atelectasis, NGT in place.  I am afraid that prognosis remains very poor at this point.  Will need to discuss code status when family is available as the patient is confused.  The patient is critically ill with multiple organ systems failure and requires high complexity decision making for assessment and support, frequent evaluation and titration of therapies, application of advanced monitoring technologies and extensive interpretation of multiple databases.   Critical Care Time devoted to patient care services described in this note is  35  Minutes. This time reflects time of care of this signee Dr Koren Bound. This critical care time does not reflect procedure time, or teaching time or supervisory time of PA/NP/Med student/Med Resident etc but  could involve care discussion time.  Alyson Reedy, M.D. [facility] Pulmonary/Critical Care Medicine. Pager: [phone]. After hours pager: [phone].

## 2015-12-01 NOTE — ED Notes (Signed)
Received call from US Tech.  Wanted a reminder placed in chart to PLEASE KEEP PATIENT NPO UNTIL US TOMORROW

## 2015-12-01 NOTE — Procedures (Signed)
Central Venous Catheter Insertion Procedure Note Druscilla BrownieChristopher E Portner [id number] 09-14-1965  Procedure: Insertion of Central Venous Catheter Indications: Assessment of intravascular volume, Drug and/or fluid administration and Frequent blood sampling  Procedure Details Consent: Risks of procedure as well as the alternatives and risks of each were explained to the (patient/caregiver).  Consent for procedure obtained. Time Out: Verified patient identification, verified procedure, site/side was marked, verified correct patient position, special equipment/implants available, medications/allergies/relevent history reviewed, required imaging and test results available.  Performed  Maximum sterile technique was used including antiseptics, cap, gloves, gown, hand hygiene, mask and sheet. Skin prep: Chlorhexidine; local anesthetic administered A antimicrobial bonded/coated triple lumen catheter was placed in the right internal jugular vein using the Seldinger technique.  Evaluation Blood flow good Complications: No apparent complications Patient did tolerate procedure well. Chest X-ray ordered to verify placement.  CXR: pending.  Shelby Mattocksete E Babcock 12/06/2015, 11:44 AM  Alyson ReedyWesam G. Kennidee Heyne, M.D. [facility] Pulmonary/Critical Care Medicine. Pager: [phone]. After hours pager: [phone].

## 2015-12-01 NOTE — Interval H&P Note (Signed)
History and Physical Interval Note:  12/28/2015 7:29 PM  Matthew Costa  has presented today for surgery, with the diagnosis of hematemesis, cirrhosis  The various methods of treatment have been discussed with the patient and family. After consideration of risks, benefits and other options for treatment, the patient has consented to  Procedure(s): ESOPHAGOGASTRODUODENOSCOPY (EGD) (Left) as a surgical intervention .  The patient's history has been reviewed, patient examined, no change in status, stable for surgery.  I have reviewed the patient's chart and labs.  Questions were answered to the patient's satisfaction.     Freddy JakschUTLAW,Catalyna Reilly M

## 2015-12-01 DEATH — deceased

## 2015-12-02 ENCOUNTER — Inpatient Hospital Stay (HOSPITAL_COMMUNITY): Payer: 59

## 2015-12-02 DIAGNOSIS — R778 Other specified abnormalities of plasma proteins: Secondary | ICD-10-CM

## 2015-12-02 DIAGNOSIS — K922 Gastrointestinal hemorrhage, unspecified: Secondary | ICD-10-CM | POA: Insufficient documentation

## 2015-12-02 DIAGNOSIS — K7200 Acute and subacute hepatic failure without coma: Secondary | ICD-10-CM | POA: Insufficient documentation

## 2015-12-02 DIAGNOSIS — N171 Acute kidney failure with acute cortical necrosis: Secondary | ICD-10-CM | POA: Insufficient documentation

## 2015-12-02 DIAGNOSIS — R7989 Other specified abnormal findings of blood chemistry: Secondary | ICD-10-CM

## 2015-12-02 LAB — CBC
HCT: 19.4 % — ABNORMAL LOW (ref 39.0–52.0)
HCT: 24.1 % — ABNORMAL LOW (ref 39.0–52.0)
HEMATOCRIT: 26.4 % — AB (ref 39.0–52.0)
HEMOGLOBIN: 6.8 g/dL — AB (ref 13.0–17.0)
HEMOGLOBIN: 8.8 g/dL — AB (ref 13.0–17.0)
HEMOGLOBIN: 8.3 g/dL — AB (ref 13.0–17.0)
MCH: 36.6 pg — AB (ref 26.0–34.0)
MCH: 32.6 pg (ref 26.0–34.0)
MCH: 34 pg (ref 26.0–34.0)
MCHC: 35.1 g/dL (ref 30.0–36.0)
MCHC: 33.3 g/dL (ref 30.0–36.0)
MCHC: 34.4 g/dL (ref 30.0–36.0)
MCV: 104.3 fL — AB (ref 78.0–100.0)
MCV: 97.8 fL (ref 78.0–100.0)
MCV: 98.8 fL (ref 78.0–100.0)
PLATELETS: 101 10*3/uL — AB (ref 150–400)
PLATELETS: 80 10*3/uL — AB (ref 150–400)
Platelets: 75 10*3/uL — ABNORMAL LOW (ref 150–400)
RBC: 1.86 MIL/uL — AB (ref 4.22–5.81)
RBC: 2.7 MIL/uL — ABNORMAL LOW (ref 4.22–5.81)
RBC: 2.44 MIL/uL — ABNORMAL LOW (ref 4.22–5.81)
RDW: 21.5 % — ABNORMAL HIGH (ref 11.5–15.5)
RDW: 22.5 % — ABNORMAL HIGH (ref 11.5–15.5)
RDW: 23.7 % — AB (ref 11.5–15.5)
WBC: 9.3 10*3/uL (ref 4.0–10.5)
WBC: 8.9 10*3/uL (ref 4.0–10.5)
WBC: 7.3 10*3/uL (ref 4.0–10.5)

## 2015-12-02 LAB — RENAL FUNCTION PANEL
ALBUMIN: 2.3 g/dL — AB (ref 3.5–5.0)
Albumin: 2.3 g/dL — ABNORMAL LOW (ref 3.5–5.0)
Anion gap: 9 (ref 5–15)
Anion gap: 11 (ref 5–15)
BUN: 30 mg/dL — ABNORMAL HIGH (ref 6–20)
BUN: 33 mg/dL — AB (ref 6–20)
CALCIUM: 7.7 mg/dL — AB (ref 8.9–10.3)
CO2: 21 mmol/L — AB (ref 22–32)
CO2: 21 mmol/L — ABNORMAL LOW (ref 22–32)
CREATININE: 2.6 mg/dL — AB (ref 0.61–1.24)
CREATININE: 2.52 mg/dL — AB (ref 0.61–1.24)
Calcium: 7.7 mg/dL — ABNORMAL LOW (ref 8.9–10.3)
Chloride: 99 mmol/L — ABNORMAL LOW (ref 101–111)
Chloride: 98 mmol/L — ABNORMAL LOW (ref 101–111)
GFR, EST AFRICAN AMERICAN: 32 mL/min — AB (ref >60)
GFR, EST AFRICAN AMERICAN: 33 mL/min — AB (ref >60)
GFR, EST NON AFRICAN AMERICAN: 28 mL/min — AB (ref >60)
GFR, EST NON AFRICAN AMERICAN: 29 mL/min — AB (ref >60)
Glucose, Bld: 163 mg/dL — ABNORMAL HIGH (ref 65–99)
Glucose, Bld: 151 mg/dL — ABNORMAL HIGH (ref 65–99)
PHOSPHORUS: 4.8 mg/dL — AB (ref 2.5–4.6)
Phosphorus: 5.1 mg/dL — ABNORMAL HIGH (ref 2.5–4.6)
Potassium: 3.2 mmol/L — ABNORMAL LOW (ref 3.5–5.1)
Potassium: 3.4 mmol/L — ABNORMAL LOW (ref 3.5–5.1)
SODIUM: 129 mmol/L — AB (ref 135–145)
Sodium: 130 mmol/L — ABNORMAL LOW (ref 135–145)

## 2015-12-02 LAB — PREPARE FRESH FROZEN PLASMA
UNIT DIVISION: 0
UNIT DIVISION: 0
UNIT DIVISION: 0
Unit division: 0
Unit division: 0

## 2015-12-02 LAB — POCT I-STAT 3, ART BLOOD GAS (G3+)
Bicarbonate: 24.6 mEq/L — ABNORMAL HIGH (ref 20.0–24.0)
O2 SAT: 100 %
PH ART: 7.438 (ref 7.350–7.450)
Patient temperature: 98.1
TCO2: 26 mmol/L (ref 0–100)
pCO2 arterial: 36.2 mmHg (ref 35.0–45.0)
pO2, Arterial: 172 mmHg — ABNORMAL HIGH (ref 80.0–100.0)

## 2015-12-02 LAB — APTT: APTT: 39 s — AB (ref 24–37)

## 2015-12-02 LAB — PROTIME-INR
INR: 2.25 — AB (ref 0.00–1.49)
INR: 1.75 — AB (ref 0.00–1.49)
PROTHROMBIN TIME: 24.7 s — AB (ref 11.6–15.2)
PROTHROMBIN TIME: 20.4 s — AB (ref 11.6–15.2)

## 2015-12-02 LAB — SPECIMEN STATUS REPORT

## 2015-12-02 LAB — VITAMIN B12: Vitamin B-12: >2000 pg/mL — ABNORMAL HIGH (ref 211–946)

## 2015-12-02 LAB — HEPATIC FUNCTION PANEL
ALBUMIN: 2.6 g/dL — AB (ref 3.5–5.5)
ALT: 31 IU/L (ref 0–44)
AST: 73 IU/L — AB (ref 0–40)
Alkaline Phosphatase: 189 IU/L — ABNORMAL HIGH (ref 39–117)
BILIRUBIN, DIRECT: 2.14 mg/dL — AB (ref 0.00–0.40)
Bilirubin Total: 4.1 mg/dL — ABNORMAL HIGH (ref 0.0–1.2)
Total Protein: 6 g/dL (ref 6.0–8.5)

## 2015-12-02 LAB — URINE CULTURE: CULTURE: NO GROWTH

## 2015-12-02 LAB — FIBRINOGEN: FIBRINOGEN: 207 mg/dL (ref 204–475)

## 2015-12-02 LAB — LACTIC ACID, PLASMA: LACTIC ACID, VENOUS: 2.6 mmol/L — AB (ref 0.5–2.0)

## 2015-12-02 LAB — MAGNESIUM: Magnesium: 2.2 mg/dL (ref 1.7–2.4)

## 2015-12-02 LAB — FOLATE: Folate: <2 ng/mL — ABNORMAL LOW (ref >3.0)

## 2015-12-02 LAB — PREPARE RBC (CROSSMATCH)

## 2015-12-02 MED ORDER — DEXTROSE 5 % IV SOLN
10.0000 mg | Freq: Once | INTRAVENOUS | Status: AC
  Administered 2015-12-02: 10 mg via INTRAVENOUS
  Filled 2015-12-02: qty 1

## 2015-12-02 MED ORDER — POTASSIUM CHLORIDE 10 MEQ/100ML IV SOLN
10.0000 meq | INTRAVENOUS | Status: AC
  Administered 2015-12-02 (×3): 10 meq via INTRAVENOUS
  Filled 2015-12-02 (×2): qty 100

## 2015-12-02 MED ORDER — SODIUM CHLORIDE 0.9 % IV SOLN
Freq: Once | INTRAVENOUS | Status: AC
  Administered 2015-12-02: 03:00:00 via INTRAVENOUS

## 2015-12-02 MED ORDER — SODIUM CHLORIDE 0.9 % IV SOLN
Freq: Once | INTRAVENOUS | Status: AC
  Administered 2015-12-02: 12:00:00 via INTRAVENOUS

## 2015-12-02 MED ORDER — NOREPINEPHRINE BITARTRATE 1 MG/ML IV SOLN
2.0000 ug/min | INTRAVENOUS | Status: DC
  Administered 2015-12-02 (×2): 30 ug/min via INTRAVENOUS
  Administered 2015-12-03: 28 ug/min via INTRAVENOUS
  Administered 2015-12-03: 34 ug/min via INTRAVENOUS
  Administered 2015-12-04: 35 ug/min via INTRAVENOUS
  Administered 2015-12-04: 47 ug/min via INTRAVENOUS
  Administered 2015-12-04 (×2): 45 ug/min via INTRAVENOUS
  Administered 2015-12-05 (×4): 50 ug/min via INTRAVENOUS
  Administered 2015-12-06: 46 ug/min via INTRAVENOUS
  Administered 2015-12-06 – 2015-12-07 (×4): 50 ug/min via INTRAVENOUS
  Filled 2015-12-02 (×22): qty 16

## 2015-12-02 NOTE — Progress Notes (Signed)
eLink Physician-Brief Progress Note Patient Name: Matthew Costa DOB: 11/12/1965 MRN: [id number]   Date of Service  12/02/2015  HPI/Events of Note  Bedside nurse reported lab result  With hemoglobin 6.8. Patient remains on vasopressor. Review of upper endoscopy shows no evidence of varices.Patient currently on ventilator.  eICU Interventions  1. Transfuse 2 units packed red blood cells stat 2. Post transfusion coags ordered 3. Continue trending hemoglobin/hematocrit every 4 hours     Intervention Category Major Interventions: Shock - evaluation and management;Hemorrhage - evaluation and management  Lawanda CousinsJennings Nayeliz Hipp 12/02/2015, 1:45 AM

## 2015-12-02 NOTE — Progress Notes (Signed)
    Subjective:  Intubated and sedated   Objective:  Filed Vitals:   12/02/15 1126 12/02/15 1130 12/02/15 1145 12/02/15 1200  BP:  100/66 98/66 100/66  Pulse:  102 101 101  Temp:  97.7 F (36.5 C) 97.7 F (36.5 C) 97.7 F (36.5 C)  TempSrc:   Core (Comment) Core (Comment)  Resp:  15 15 15   Height:      Weight:      SpO2: 96% 94% 95% 95%    Intake/Output from previous day:  Intake/Output Summary (Last 24 hours) at 12/02/15 1210 Last data filed at 12/02/15 1200  Gross per 24 hour  Intake 6795.22 ml  Output   1103 ml  Net 5692.22 ml    Physical Exam: Physical exam: Well-developed well-nourished intubated and sedated Skin is warm and dry.  HEENT is normal.  Neck is supple.  Chest CTA anteriorly Cardiovascular exam is regular rate and rhythm.  Abdominal exam distended Extremities show 3+ edema. neuro not assessed due to intubation/sedation    Lab Results: Basic Metabolic Panel:  Recent Labs  16/10/96April 17, 2017 0043  12/02/15 0100 12/02/15 0916  NA 131*  < > 129* 130*  K 4.2  < > 3.2* 3.4*  CL 96*  < > 99* 98*  CO2 12*  < > 21* 21*  GLUCOSE 123*  < > 163* 151*  BUN 25*  < > 30* 33*  CREATININE 2.75*  < > 2.60* 2.52*  CALCIUM 7.7*  < > 7.7* 7.7*  MG 1.9  --   --  2.2  PHOS 7.1*  < > 5.1* 4.8*  < > = values in this interval not displayed. CBC:  Recent Labs  11/29/15 1554  12/02/15 0100 12/02/15 0916  WBC 11.6*  < > 9.3 8.9  NEUTROABS 9.6*  --   --   --   HGB  --   < > 6.8* 8.8*  HCT 39.8  < > 19.4* 26.4*  MCV 112*  < > 104.3* 97.8  PLT 155  < > 101* 75*  < > = values in this interval not displayed.   Assessment/Plan:  1 non-ST elevation myocardial infarction-patient's troponin is elevated likely secondary to demand ischemia. He is not a candidate for aggressive cardiac evaluation. We cannot add aspirin or heparin due to ongoing GI bleed. We cannot add beta blockade or other medications because of hypotension and need for pressors. Await echocardiogram  for LV function. 2 VDRF-management per CCM 3 acute renal failure 4 cirrhosis/acute liver failure 5 alcohol abuse 6 volume overload-likely multifactorial including acute renal failure, cirrhosis with low albumin with possible cardiac contribution. Await echocardiogram. 7 GI bleed-follow hemoglobin. S/p EGD-esophageal ulcerations felt c/w ischemia or toxic ingestion. 8 coagulopathy 9 sepsis  Matthew MillersBrian Costa Matthew Costa 12/02/2015, 12:10 PM

## 2015-12-02 NOTE — Progress Notes (Signed)
Subjective: Intubated. Some more blood pooling in oropharynx overnight.  Objective: Vital signs in last 24 hours: Temp:  [97.3 F (36.3 C)-98.4 F (36.9 C)] 97.7 F (36.5 C) (04/02 1045) Pulse Rate:  [33-133] 98 (04/02 1045) Resp:  [0-30] 15 (04/02 1045) BP: (59-135)/(22-107) 102/66 mmHg (04/02 1045) SpO2:  [94 %-100 %] 99 % (04/02 1045) FiO2 (%):  [40 %-100 %] 40 % (04/02 0905) Weight:  [116.5 kg (256 lb 13.4 oz)] 116.5 kg (256 lb 13.4 oz) (04/02 0437) Weight change: 11.265 kg (24 lb 13.4 oz)   PE: GEN:  Intubated, sedated HEENT:  Jaundiced SKIN:  Diffusely edematous, ecchymoses, telangiectasias ABD:  Moderate ascites EXT:  3-4+ edema BLE  Lab Results: CBC    Component Value Date/Time   WBC 8.9 12/02/2015 0916   WBC 11.6* 11/29/2015 1554   RBC 2.70* 12/02/2015 0916   RBC 3.55* 11/29/2015 1554   HGB 8.8* 12/02/2015 0916   HCT 26.4* 12/02/2015 0916   HCT 39.8 11/29/2015 1554   PLT 75* 12/02/2015 0916   PLT 155 11/29/2015 1554   MCV 97.8 12/02/2015 0916   MCV 112* 11/29/2015 1554   MCH 32.6 12/02/2015 0916   MCH 38.3* 11/29/2015 1554   MCHC 33.3 12/02/2015 0916   MCHC 34.2 11/29/2015 1554   RDW 22.5* 12/02/2015 0916   RDW 14.7 11/29/2015 1554   LYMPHSABS 1.0 11/29/2015 1554   LYMPHSABS 0.6* 12/23/2012 1900   MONOABS 0.8 12/23/2012 1900   EOSABS 0.0 11/29/2015 1554   EOSABS 0.0 12/23/2012 1900   BASOSABS 0.0 11/29/2015 1554   BASOSABS 0.1 12/23/2012 1900   CMP     Component Value Date/Time   NA 130* 12/02/2015 0916   NA 127* 11/29/2015 1554   K 3.4* 12/02/2015 0916   CL 98* 12/02/2015 0916   CO2 21* 12/02/2015 0916   GLUCOSE 151* 12/02/2015 0916   GLUCOSE 116* 11/29/2015 1554   BUN 33* 12/02/2015 0916   BUN 15 11/29/2015 1554   CREATININE 2.52* 12/02/2015 0916   CALCIUM 7.7* 12/02/2015 0916   PROT 4.0* 09-28-2015 0043   PROT 6.0 11/29/2015 1554   PROT 6.0 11/29/2015 1554   ALBUMIN 2.3* 12/02/2015 0916   ALBUMIN 2.5* 11/29/2015 1554   ALBUMIN 2.6*  11/29/2015 1554   AST 231* 09-28-2015 0043   ALT 58 09-28-2015 0043   ALKPHOS 85 09-28-2015 0043   BILITOT 7.2* 09-28-2015 0043   BILITOT 4.4* 11/29/2015 1554   BILITOT 4.1* 11/29/2015 1554   GFRNONAA 29* 12/02/2015 0916   GFRAA 33* 12/02/2015 0916   Assessment:  1.  Liver failure.  Alcohol hepatitis versus alcohol cirrhosis versus toxic ingestion. 2.  Renal failure:  Hepatorenal syndrome versus toxic ingestion.  Renal output improving. 3.  Cirrhosis complicated by ascites, edema (anasarca), coagulopathy, encephalopathy. 4.  Hematemesis.  Endoscopy showed severe diffuse esophageal necrosis.  No obvious varices. 5.  Acute blood loss anemia.  Plan:  1.  No orogastric or nasogastric tubes. 2.  Continue to check and correct coagulopathy as needed, in setting of ongoing bleeding and worsening anemia, goal INR < 2. 3.  Follow Hgb and LFTs. 4.  Continue PPI drip. 5.  Prognosis guarded. 6.  [facility] will follow.   Freddy JakschOUTLAW,Sharone Picchi M 12/02/2015, 10:56 AM   Pager [phone] If no answer or after 5 PM call [phone]

## 2015-12-02 NOTE — Progress Notes (Signed)
PULMONARY / CRITICAL CARE MEDICINE HISTORY AND PHYSICAL EXAMINATION   Name: Matthew Costa MRN: [id number] DOB: 08-11-1966    ADMISSION DATE:  12/14/2015  PRIMARY SERVICE: PCCM  CHIEF COMPLAINT:  Swelling, confusion  BRIEF PATIENT DESCRIPTION: 50 y/o man with hx of EtOH abuse presenting with jaundice, swelling, and confusion  SIGNIFICANT EVENTS / STUDIES:  3/31- hypotensive.  4/1: intubated. Aspiration PNA on CXR. Had EGD: diffuse ulcerative changes t/o the esophagus and stomach. ? D/t ischemia vs toxic ingestion.   LINES / TUBES: - PIV x3 R IJ TLC 4/1>>> OETT 4/1>>>  CULTURES: Blood x2 (3/31)>>>  ANTIBIOTICS: Vanc 3/31 >> Zosyn 3/31 >>  SUBJECTIVE:  Sedated pm vent   VITAL SIGNS: Temp:  [97.3 F (36.3 C)-98.4 F (36.9 C)] 97.7 F (36.5 C) (04/02 0945) Pulse Rate:  [33-133] 99 (04/02 0945) Resp:  [0-30] 0 (04/02 0945) BP: (59-135)/(22-107) 96/65 mmHg (04/02 0945) SpO2:  [94 %-100 %] 96 % (04/02 0945) FiO2 (%):  [40 %-100 %] 40 % (04/02 0905) Weight:  [256 lb 13.4 oz (116.5 kg)] 256 lb 13.4 oz (116.5 kg) (04/02 0437) HEMODYNAMICS: CVP:  [1 mmHg-38 mmHg] 15 mmHg VENTILATOR SETTINGS: Vent Mode:  [-] PRVC FiO2 (%):  [40 %-100 %] 40 % Set Rate:  [15 bmp] 15 bmp Vt Set:  [620 mL] 620 mL PEEP:  [5 cmH20] 5 cmH20 Plateau Pressure:  [20 cmH20-28 cmH20] 21 cmH20 INTAKE / OUTPUT: Intake/Output      04/01 0701 - 04/02 0700 04/02 0701 - 04/03 0700   I.V. (mL/kg) 4124.9 (35.4) 381.2 (3.3)   Blood 1906 280   IV Piggyback 150    Total Intake(mL/kg) 6180.9 (53.1) 661.2 (5.7)   Urine (mL/kg/hr) 203 (0.1) 60 (0.2)   Emesis/NG output 800 (0.3)    Total Output 1003 60   Net +5177.9 +601.2         PHYSICAL EXAMINATION: General:  Slightly jaundiced male. Sedated on vent Neuro:  Sedated on vent  HEENT:  Scleral icterus, MMM, blood around mouth and nose. Orally intubated Neck: No JVD.  Cardiovascular:  Normal, tachy  Lungs: diffuse rhonchi, no accessory  use Abdomen:  Obese, soft, no pain. + ascites. More distended  Musculoskeletal:  No joint swelling; equal st  Skin:  Numerous angiomata on face, nose. + anasarca.   LABS:  CBC  Recent Labs Lab 12/29/2015 1834 12/02/15 0100 12/02/15 0916  WBC 13.6* 9.3 8.9  HGB 8.1* 6.8* 8.8*  HCT 22.9* 19.4* 26.4*  PLT 110* 101* 75*   Coag's  Recent Labs Lab 12/25/2015 1042 12/23/2015 1739 12/02/15 0916  APTT  --   --  39*  INR 3.49* 2.24* 2.25*   BMET  Recent Labs Lab 12/17/2015 1042 12/05/2015 1834 12/02/15 0100  NA 128* 130* 129*  K 3.9 3.6 3.2*  CL 97* 98* 99*  CO2 17* 20* 21*  BUN 26* 28* 30*  CREATININE 2.56* 2.64* 2.60*  GLUCOSE 117* 127* 163*   Electrolytes  Recent Labs Lab 12/26/2015 0043 12/24/2015 1042 12/12/2015 1834 12/02/15 0100  CALCIUM 7.7* 7.6* 8.0* 7.7*  MG 1.9  --   --   --   PHOS 7.1* 6.1* 5.9* 5.1*   Sepsis Markers  Recent Labs Lab 12/14/15 2029 12/30/2015 0128 12/02/15 0230  LATICACIDVEN >17.00* 16.79* 2.6*   ABG  Recent Labs Lab 12/29/2015 1049 12/12/2015 1746 12/02/15 0232  PHART 7.497* 7.371 7.438  PCO2ART 24.7* 40.5 36.2  PO2ART 88.0 265.0* 172.0*   Liver Enzymes  Recent Labs Lab 11/29/15 1554  11/21/2015 1912 12/25/2015 0043 12/25/15 1042 December 25, 2015 1834 12/02/15 0100  AST 73*  77*  --  121* 231*  --   --   --   ALT 31  37  --  <5* 58  --   --   --   ALKPHOS 189*  187*  --  104 85  --   --   --   BILITOT 4.1*  4.4*  --  7.6* 7.2*  --   --   --   ALBUMIN 2.6*  2.5*  < > 1.8* 1.4* 2.2* 2.5* 2.3*  < > = values in this interval not displayed. Cardiac Enzymes  Recent Labs Lab 11/29/15 1554  PROBNP 224*   Glucose  Recent Labs Lab 11/10/2015 2012 11/27/2015 2036 11/13/2015 2039 11/23/2015 2138 12/25/15 0354  GLUCAP 26* 23* 119* 100* 109*    Imaging US Abdomen Port  12/25/15  CLINICAL DATA:  Liver failure EXAM: ABDOMEN ULTRASOUND COMPLETE COMPARISON:  None. FINDINGS: Gallbladder: There is sludge in the gallbladder. No stones. No  Murphy sign. The gallbladder wall is thickened at 8 mm. Common bile duct: Diameter: 4 mm. Liver: The liver is diffusely heterogeneous and hyperechoic. The contour of the liver is nodular. The parenchyma attenuates the ultrasound beam. No focal mass. IVC: Obscured. Pancreas: Obscured. Spleen: Size and appearance within normal limits. Right Kidney: Length: 11.3 cm. Echogenicity within normal limits. No mass or hydronephrosis visualized. Left Kidney: Length: 10.6 cm. Echogenicity within normal limits. No mass or hydronephrosis visualized. Abdominal aorta: Obscured. Other findings: There is ascites about the liver. IMPRESSION: Limited exam.  The aorta, IVC, and pancreas were obscured. Cirrhotic and fatty liver. Ascites. Nonspecific gallbladder wall thickening with gallbladder sludge. No stones. Electronically Signed   By: Jolaine Click M.D.   On: December 25, 2015 10:53   [place] Art/ven Flow Abd Pelv Doppler  25-Dec-2015  CLINICAL DATA:  Liver failure EXAM: DUPLEX ULTRASOUND OF LIVER TECHNIQUE: Color and duplex Doppler ultrasound was performed to evaluate the hepatic in-flow and out-flow vessels. COMPARISON:  None. FINDINGS: Portal Vein Velocities Main:  35.4 cm/sec Right:  Not visualized cm/sec Left:  Not visualized cm/sec Hepatic Vein Velocities Right:  50.7 cm/sec Middle:  39.9 cm/sec Left:  Not visualized cm/sec Hepatic Artery Velocity:  213.2 cm/sec Splenic Vein Velocity:  Not visualized cm/sec Varices: None visualized Ascites: Present The liver parenchyma is heterogeneous, hyperechoic, and dense. There was great difficulty in penetrating the liver parenchyma for vascular imaging. This study is very limited. This is likely a result of diffuse hepatic parenchymal disease with hepatic steatosis. Only portions of the portal vein could be identified and flow is grossly hepatofugal. Right and middle hepatic veins could be visualized with confirmed hepatofugal flow. The splenic vein could not be visualized. IMPRESSION: The study  was very limited secondary to difficulty in penetrating the liver parenchyma. Only portions of the portal vein could be identified, and it is grossly hepatofugal inflow. Middle and right hepatic veins are patent with normal directionality of flow. Consider further imaging such chest CT or MRI of the liver to better delineate the vasculature, and confirmation of these findings. Electronically Signed   By: Jolaine Click M.D.   On: 12/25/15 11:00   Dg Chest Port 1 View  12/02/2015  CLINICAL DATA:  Multi system organ failure and respiratory failure. EXAM: PORTABLE CHEST 1 VIEW COMPARISON:  12-25-2015 and prior exams FINDINGS: An endotracheal tube with tip 3.4 cm above the carina, right IJ central venous catheter with tip overlying the superior cavoatrial  junction again noted. An NG tube has been removed. Slightly decreased lung volumes with right basilar atelectasis and left basilar atelectasis/ consolidation again noted. Mild pulmonary vascular congestion again identified. There is no evidence of pneumothorax. IMPRESSION: Slightly decreased lung volumes and NG tube removal, otherwise unchanged appearance of the chest. Electronically Signed   By: Harmon PierJeffrey  Hu M.D.   On: 12/02/2015 07:13   Dg Chest Port 1 View  12/18/2015  CLINICAL DATA:  Endotracheal tube placement. History of hypertension, coronary artery disease, MI EXAM: PORTABLE CHEST 1 VIEW COMPARISON:  Chest x-ray from earlier same day. Also chest x-rays dated 11/05/2015 and 11/29/2015. FINDINGS: Endotracheal tube has been placed with tip well positioned approximately 2.5 cm above the carina. Enteric tube passes below the diaphragm. Right IJ central catheter is stable in position. Cardiomediastinal silhouette is stable in size and configuration. There is mild central pulmonary vascular congestion without frank pulmonary edema. Dense opacity at the left lung base is probably atelectasis. IMPRESSION: 1. Endotracheal tube well positioned with tip approximately  2.5 cm above the carina. 2. New dense opacity at the left lung base, most likely atelectasis, alternatively aspiration. Suspect small adjacent pleural effusion. Persistent central pulmonary vascular congestion without frank pulmonary edema. Electronically Signed   By: Bary RichardStan  Maynard M.D.   On: 12/04/2015 18:12   Dg Chest Port 1 View  12/11/2015  CLINICAL DATA:  Central line placement. Pneumonia. Multiorgan system failure. EXAM: PORTABLE CHEST 1 VIEW COMPARISON:  11/23/2015 FINDINGS: A new right jugular central venous catheter is seen with tip at the superior cavoatrial junction. A nasogastric tube is also seen entering the stomach. No pneumothorax identified. Low lung volumes are seen. No evidence of pulmonary consolidation or edema. No evidence of pleural effusion. IMPRESSION: New right jugular central venous catheter and nasogastric tube in appropriate position. No evidence of pneumothorax or other active lung disease. Electronically Signed   By: Myles RosenthalJohn  Stahl M.D.   On: 12/04/2015 13:02   Dg Chest Port 1 View  11/20/2015  CLINICAL DATA:  SOB, tachycardia, altered mental status EXAM: PORTABLE CHEST 1 VIEW COMPARISON:  11/29/2015 FINDINGS: Midline trachea.  Normal heart size and mediastinal contours. Sharp costophrenic angles.  No pneumothorax.  Clear lungs. Mild right hemidiaphragm elevation. Numerous leads and wires project over the chest. IMPRESSION: No active disease. Electronically Signed   By: Jeronimo GreavesKyle  Talbot M.D.   On: 11/10/2015 22:53    EKG: Sinus rhythm CXR: low lung volumes-->4/2 interval RLL airspace disease; likely reflects aspiration   ASSESSMENT / PLAN:  Active Problems:   Multisystem organ failure   MOSF (multiple organ systems failure)   Acute respiratory failure with hypoxia (HCC)   PULMONARY A: Ventilator dependence in setting of inability to protect airway Aspiration PNA  P:   Cont full vent support for now Cont PAD protocol  F/u PCXR in am   CARDIOVASCULAR A: Circulatory  shock. Initially was hypovolemia, but then likely element of cardiogenic in setting of acidosis w/ related cardiac suppression. Now likely sedation related, bleeding +/- sepsis CAD.  Troponinemia-->suspect demand ischemia  P:   MAP goal > 65 CVP goal 8-12 Fluids as indicated Wean levophed  Holding anticoag given active bleed.  RENAL A: Acute renal failure: volume depletion, diuretics +/- NSAIDS-->creatinine w/out sig change Metabolic acidosis + AG in setting of lactic acidosis -->lactic acid cleared Hyponatremia  Hypokalemia  P:   CVP goal 8-12 Hold diuretics and antihypertensives Replace K  MAP goal >65 Liver is unable to metabolize lactate presently-->will d/c further  lactate trends.    GASTROINTESTINAL A: Acute liver failure UGIB. EGD: finding showed-->severe necrosis of the esophagus. Etiologies could includeischemic necrosis versus caustic ingestion. Gastric body with mild-to-moderate necrosis. No varices Ascites  P:   Cont PPI gtt NPO No further endoscopic interventions as nothing else to offer. No surgical interventions given the diffuse nature of the findings.  Correct coags   HEMATOLOGIC A: Anemia from GIB s/p x-fusion PRBCs.  Coagulopathy in setting of liver dz  Mild thrombocytopenia  P:   F/u INR, goal <1.5-->transfuse as indicated  Serial cbc and coags Transfuse for hgb <7 OR for acute hgb changes in setting of acute bleeding  Will transfuse platelets  INFECTIOUS A: Sepsis.  ? SBP Aspiration PNA P:   Unclear source, cx pending. Tx w/ vanc+zosyn Will consider diagnostic paracentesis later today   ENDOCRINE A: No active issues P:   Trend glucose   NEUROLOGIC A: Hepatic encephalopathy  Probably element of w/d P:   Cont PAD protocol  Will eventually need lactulose but in setting of acute GIB will hold off   BEST PRACTICE / DISPOSITION Level of Care:  ICU Primary Service:  PCCM Consultants:  GI -> to call in AM Code Status:  Full Diet:   NPO DVT Px:  Holding GI Px:  On PPI Skin Integrity:  Intact Social / Family:  Updated on admission  TODAY'S SUMMARY:   Mr Dalby is a 50 y/o man with a hx of EtOH abuse as well as coronary disease (prior NSTEMI). Admitted 3/31 w/ Lower extremity edema, as well as confusion, and jaundice. His wife brought him to the ED for evaluation. Admitted w/ UGIB, ABLA, AKI and acute liver failure. Concerned about acute alcoholic hepatitis w/ now UGIB. His EGD showed diffuse ulcerative changes throughout the esophagus and stomach. Felt to be either c/w ischemia OR toxic ingestion. Given the diffuse findings there are no surgical interventions available for this. He now has an aspiration PNA, and remains in shock/ MODS w/ no improvement in his renal fxn. Have spoke w/ wife. Will continue aggressive care but he is now DNR. Suspect that this will declare itself in the next 24-48hrs  Simonne Martinet ACNP-BC [facility] Pulmonary/Critical Care Pager # [phone] OR # [phone] if no answer 12/02/2015, 9:55 AM  Attending Note:    50 year old male with etoh abuse, cirrhosis and upper GI bleed.  EGD was done with diffuse necrotic esophagitis.  Little that can be done about that surgically or otherwise.  On exam, patient is sedated with coarse BS diffusely.  Had an extensive discussion with the patient's wife.  After discussion, decision was made to make patient a full DNR with no escalation of care.  Will allow a few days for PPI and bowel rest then extubate with no intention to reintubate.  Will continue treatment in the meantime.    The patient is critically ill with multiple organ systems failure and requires high complexity decision making for assessment and support, frequent evaluation and titration of therapies, application of advanced monitoring technologies and extensive interpretation of multiple databases.   Critical Care Time devoted to patient care services described in this note is  35  Minutes. This time  reflects time of care of this signee Dr Koren Bound. This critical care time does not reflect procedure time, or teaching time or supervisory time of PA/NP/Med student/Med Resident etc but could involve care discussion time.  Alyson Reedy, M.D. [facility] Pulmonary/Critical Care Medicine. Pager: [phone]. After hours  pager: [phone].

## 2015-12-03 ENCOUNTER — Encounter (HOSPITAL_COMMUNITY): Payer: Self-pay | Admitting: Gastroenterology

## 2015-12-03 ENCOUNTER — Inpatient Hospital Stay (HOSPITAL_COMMUNITY): Payer: 59

## 2015-12-03 DIAGNOSIS — R34 Anuria and oliguria: Secondary | ICD-10-CM | POA: Insufficient documentation

## 2015-12-03 DIAGNOSIS — R748 Abnormal levels of other serum enzymes: Secondary | ICD-10-CM | POA: Insufficient documentation

## 2015-12-03 LAB — CBC
HEMATOCRIT: 24.9 % — AB (ref 39.0–52.0)
HEMATOCRIT: 25.9 % — AB (ref 39.0–52.0)
HEMOGLOBIN: 8.6 g/dL — AB (ref 13.0–17.0)
HEMOGLOBIN: 8.6 g/dL — AB (ref 13.0–17.0)
MCH: 34.4 pg — ABNORMAL HIGH (ref 26.0–34.0)
MCH: 33 pg (ref 26.0–34.0)
MCHC: 34.5 g/dL (ref 30.0–36.0)
MCHC: 33.2 g/dL (ref 30.0–36.0)
MCV: 99.6 fL (ref 78.0–100.0)
MCV: 99.2 fL (ref 78.0–100.0)
PLATELETS: 79 10*3/uL — AB (ref 150–400)
Platelets: 79 10*3/uL — ABNORMAL LOW (ref 150–400)
RBC: 2.5 MIL/uL — AB (ref 4.22–5.81)
RBC: 2.61 MIL/uL — AB (ref 4.22–5.81)
RDW: 23.9 % — ABNORMAL HIGH (ref 11.5–15.5)
RDW: 23.5 % — ABNORMAL HIGH (ref 11.5–15.5)
WBC: 7.4 10*3/uL (ref 4.0–10.5)
WBC: 6.9 10*3/uL (ref 4.0–10.5)

## 2015-12-03 LAB — RENAL FUNCTION PANEL
ALBUMIN: 2.3 g/dL — AB (ref 3.5–5.0)
ANION GAP: 10 (ref 5–15)
BUN: 39 mg/dL — ABNORMAL HIGH (ref 6–20)
CO2: 21 mmol/L — ABNORMAL LOW (ref 22–32)
Calcium: 7.9 mg/dL — ABNORMAL LOW (ref 8.9–10.3)
Chloride: 102 mmol/L (ref 101–111)
Creatinine, Ser: 2.23 mg/dL — ABNORMAL HIGH (ref 0.61–1.24)
GFR, EST AFRICAN AMERICAN: 39 mL/min — AB (ref >60)
GFR, EST NON AFRICAN AMERICAN: 33 mL/min — AB (ref >60)
GLUCOSE: 92 mg/dL (ref 65–99)
PHOSPHORUS: 4.3 mg/dL (ref 2.5–4.6)
POTASSIUM: 3.4 mmol/L — AB (ref 3.5–5.1)
SODIUM: 133 mmol/L — AB (ref 135–145)

## 2015-12-03 LAB — PREPARE FRESH FROZEN PLASMA
UNIT DIVISION: 0
Unit division: 0
Unit division: 0

## 2015-12-03 LAB — GLUCOSE, CAPILLARY
Glucose-Capillary: 117 mg/dL — ABNORMAL HIGH (ref 65–99)
Glucose-Capillary: 52 mg/dL — ABNORMAL LOW (ref 65–99)
Glucose-Capillary: 91 mg/dL (ref 65–99)

## 2015-12-03 LAB — PREPARE PLATELET PHERESIS: Unit division: 0

## 2015-12-03 MED ORDER — ANTISEPTIC ORAL RINSE SOLUTION (CORINZ)
7.0000 mL | Freq: Four times a day (QID) | OROMUCOSAL | Status: DC
Start: 2015-12-04 — End: 2015-12-08
  Administered 2015-12-03 – 2015-12-07 (×16): 7 mL via OROMUCOSAL

## 2015-12-03 MED ORDER — DEXTROSE 50 % IV SOLN
INTRAVENOUS | Status: AC
Start: 2015-12-03 — End: 2015-12-03
  Administered 2015-12-03: 50 mL
  Filled 2015-12-03: qty 50

## 2015-12-03 MED ORDER — CHLORHEXIDINE GLUCONATE 0.12% ORAL RINSE (MEDLINE KIT)
15.0000 mL | Freq: Two times a day (BID) | OROMUCOSAL | Status: DC
  Administered 2015-12-03 – 2015-12-07 (×8): 15 mL via OROMUCOSAL

## 2015-12-03 MED ORDER — SODIUM CHLORIDE 0.9 % IV SOLN
0.0300 [IU]/min | INTRAVENOUS | Status: DC
  Administered 2015-12-03 – 2015-12-07 (×4): 0.03 [IU]/min via INTRAVENOUS
  Filled 2015-12-03 (×6): qty 2

## 2015-12-03 MED ORDER — SODIUM CHLORIDE 0.9 % IV SOLN
INTRAVENOUS | Status: DC
  Administered 2015-12-03: 19:00:00 via INTRAVENOUS

## 2015-12-03 MED ORDER — ZINC TRACE METAL 1 MG/ML IV SOLN
INTRAVENOUS | Status: AC
  Administered 2015-12-03: 18:00:00 via INTRAVENOUS
  Filled 2015-12-03: qty 960

## 2015-12-03 MED ORDER — INSULIN ASPART 100 UNIT/ML ~~LOC~~ SOLN: 0.0000 [IU] | Freq: Four times a day (QID) | SUBCUTANEOUS | Status: DC

## 2015-12-03 NOTE — Progress Notes (Signed)
    Subjective:  Intubated and sedated. Unresponsive.   Objective:  Filed Vitals:   12/03/15 0900 12/03/15 0910 12/03/15 1000 12/03/15 1100  BP: 86/56 86/56 79/55  81/56  Pulse: 109 109 106 109  Temp: 97 F (36.1 C)  97 F (36.1 C) 97.2 F (36.2 C)  TempSrc:      Resp: 19 19 18 22   Height:      Weight:      SpO2: 88% 90% 93% 95%    Intake/Output from previous day:  Intake/Output Summary (Last 24 hours) at 12/03/15 1204 Last data filed at 12/03/15 1200  Gross per 24 hour  Intake 6041.65 ml  Output    755 ml  Net 5286.65 ml    Physical Exam: Physical exam: Well-developed well-nourished intubated and sedated Skin is warm and dry.  HEENT is normal.  Neck is supple.  Chest CTA anteriorly Cardiovascular exam is regular rate and rhythm.  Abdominal exam distended Extremities show 3+ edema. neuro - intubated and sedated    Lab Results: Basic Metabolic Panel:  Recent Labs  16/06/9603/01/17 0043  12/02/15 0916 12/03/15 0430  NA 131*  < > 130* 133*  K 4.2  < > 3.4* 3.4*  CL 96*  < > 98* 102  CO2 12*  < > 21* 21*  GLUCOSE 123*  < > 151* 92  BUN 25*  < > 33* 39*  CREATININE 2.75*  < > 2.52* 2.23*  CALCIUM 7.7*  < > 7.7* 7.9*  MG 1.9  --  2.2  --   PHOS 7.1*  < > 4.8* 4.3  < > = values in this interval not displayed. CBC:  Recent Labs  12/02/15 1649 12/03/15 0430  WBC 7.3 7.4  HGB 8.3* 8.6*  HCT 24.1* 24.9*  MCV 98.8 99.6  PLT 80* 79*     Assessment/Plan:  1 non-ST elevation myocardial infarction-istat troponin 1.41 on admission. No other troponins checked. Patient's troponin is elevated likely secondary to demand ischemia. He is not a candidate for aggressive cardiac evaluation due to multisystem organ failure. We cannot add aspirin or heparin due to  GI bleed. We cannot add beta blockade or other medications because of hypotension and need for pressors. Await echocardiogram today for LV function. 2 VDRF-management per CCM 3 Acute renal failure 4  Cirrhosis/acute liver failure 5 Alcohol abuse 6 Volume overload-likely multifactorial including acute renal failure, cirrhosis with low albumin with possible cardiac contribution. Await echocardiogram. 7 GI bleed-secondary to esophageal necrosis. Follow hemoglobin. S/p EGD-esophageal ulcerations felt c/w ischemia or toxic ingestion. 8 Coagulopathy 9 Sepsis  Judi CongPeter JordanMD,FACC 12/03/2015, 12:04 PM

## 2015-12-03 NOTE — Progress Notes (Signed)
eLink Physician-Brief Progress Note Patient Name: Matthew BrownieChristopher E Costa DOB: 1966/01/20 MRN: [id number]   Date of Service  12/03/2015  HPI/Events of Note  Hypoxia on 60%  eICU Interventions  Increase to 100%     Intervention Category Major Interventions: Hypoxemia - evaluation and management  Seriah Brotzman V. 12/03/2015, 4:21 PM

## 2015-12-03 NOTE — Progress Notes (Signed)
PARENTERAL NUTRITION CONSULT NOTE - INITIAL  Pharmacy Consult for TPN Indication: Necrotic esophagus  No Known Allergies  Patient Measurements: Height: 6' (182.9 cm) Weight: 263 lb 7.2 oz (119.5 kg) IBW/kg (Calculated) : 77.6 Adjusted Body Weight: 90 kg Usual Weight:   Vital Signs: Temp: 97 F (36.1 C) (04/03 1200) BP: 83/56 mmHg (04/03 1208) Pulse Rate: 109 (04/03 1208) Intake/Output from previous day: 04/02 0701 - 04/03 0700 In: 6601.7 [I.V.:4552.8; Blood:1548.9; IV Piggyback:500] Out: 820 [Urine:470] Intake/Output from this shift: Total I/O In: 954 [I.V.:954] Out: 95 [Urine:95]  Labs:  Recent Labs  12/28/2015 1739  12/02/15 0916 12/02/15 1649 12/03/15 0430  WBC  --   < > 8.9 7.3 7.4  HGB  --   < > 8.8* 8.3* 8.6*  HCT  --   < > 26.4* 24.1* 24.9*  PLT  --   < > 75* 80* 79*  APTT  --   --  39*  --   --   INR 2.24*  --  2.25* 1.75*  --   < > = values in this interval not displayed.   Recent Labs  11/05/2015 1912 12/20/2015 0043  12/02/15 0100 12/02/15 0916 12/03/15 0430  NA 133* 131*  < > 129* 130* 133*  K 4.3 4.2  < > 3.2* 3.4* 3.4*  CL 93* 96*  < > 99* 98* 102  CO2 13* 12*  < > 21* 21* 21*  GLUCOSE 41* 123*  < > 163* 151* 92  BUN 27* 25*  < > 30* 33* 39*  CREATININE 2.88* 2.75*  < > 2.60* 2.52* 2.23*  CALCIUM 8.6* 7.7*  < > 7.7* 7.7* 7.9*  MG  --  1.9  --   --  2.2  --   PHOS  --  7.1*  < > 5.1* 4.8* 4.3  PROT 5.1* 4.0*  --   --   --   --   ALBUMIN 1.8* 1.4*  < > 2.3* 2.3* 2.3*  AST 121* 231*  --   --   --   --   ALT <5* 58  --   --   --   --   ALKPHOS 104 85  --   --   --   --   BILITOT 7.6* 7.2*  --   --   --   --   < > = values in this interval not displayed. Estimated Creatinine Clearance: 54.7 mL/min (by C-G formula based on Cr of 2.23).    Recent Labs  11/09/2015 2039 11/28/2015 2138 12/13/2015 0354  GLUCAP 119* 100* 109*    Medical History: Past Medical History  Diagnosis Date  . Hypertension   . Hyperlipidemia   . Coronary artery  disease   . Myocardial infarction ([place])   . Depression     PT IS ALCOHOLIC- RECOVERING- COMPLETED 30 DAY FELLOWSHIP HALL REHAB APRIL 2014 - NO ALCOHOL SINCE  . Arthritis     SPINE  . HNP (herniated nucleus pulposus)     LUMBAR L5-S1- PAIN IS IN RT BUTTOCK AND DOWN RT LEG TO FEET- TINGLING IN FEET  . ETOH abuse      Insulin Requirements in the past 24 hours:  None  Current Nutrition:  NPO  IVF:  NS at 133m/hr  Assessment: 50y/o M alcoholic referred to ED by PCP for jaundice, abdominal distention, edema, intractable N/V (black, coffee ground). Pt in acute liver failure, ARF, elevated LA, hypoglycemia, UGIB with AMS. Elevated troponins.  PMH: h/o CAD  12/13, noncompliance, alcoholism  GI: GIB from esophageal necrosis.S/p EGD-esophageal ulcerations felt c/w ischemia or toxic ingestion. No NG or OG tubes. Currently on IV Protonix infusion. Baseline albumin 2.5.  Endo: 92, no h/o DM noted. Will add SSI   Lytes: K=3.4 low, Phos 4.3 ok, Mg 2.2.  Renal: ARF. Scr 2.23 declining. UOP only 0.2/24h  Pulm: VDRF on Fent/Versed  Cards: h/o CAD. New NSTEMI likely from demand ischemia. Norepi drip  Hepatobil: Acute liver failure with thrombocytopenia, elevated INR.  Last Tbili 4/1: 7.2, AST 231, ALT 58 ok, alk phos 85 ok, albumin 4.1  Neuro: alcohol abuse  ID: Sepsis on Zosyn. BC NGTD.  Best Practices: SCDs, PPI infusion  TPN Access: CVC Triple Lumen 12/06/2015  TPN start date: 12/03/2015   Nutritional Goals:  _____ kCal, _____ grams of protein per day  Plan:  RD consult Start SSI Decrease IVFwhen TPN begins to 85 ml/hr Start Clinimix E 5/15 at 41m/hr Hold IV Lipids until assessment of triglycerides.    Jamorris Ndiaye S. RAlford Highland PharmD, BCPS Clinical Staff Pharmacist Pager [phone] REilene GhaziStillinger 12/03/2015,12:31 PM

## 2015-12-03 NOTE — Progress Notes (Signed)
Initial Nutrition Assessment  DOCUMENTATION CODES:   Obesity unspecified  INTERVENTION:   TPN dosing per Pharmacy to meet nutrition needs as able. May not be able to meet nutrition goals with pre-mixed Clinimix solution.  NUTRITION DIAGNOSIS:   Inadequate oral intake related to inability to eat, altered GI function as evidenced by NPO status.  GOAL:   Provide needs based on ASPEN/SCCM guidelines  MONITOR:   Vent status, Labs, Weight trends, I & O's  REASON FOR ASSESSMENT:   Ventilator, Consult New TPN/TNA  ASSESSMENT:   50 y/o man with hx of EtOH abuse presenting with jaundice, swelling, and confusion.  Patient is currently intubated on ventilator support MV: 13.1 L/min Temp (24hrs), Avg:98 F (36.7 C), Min:97 F (36.1 C), Max:98.4 F (36.9 C)   Received consult for new TPN to start today for necrotic esophagus (per recent EGD) causing GI bleeding. GI team does not want patient to have NG/OG tube. Nutrition-Focused physical exam completed. Findings are no fat depletion, no muscle depletion, and severe edema. Weight is elevated with positive fluid status. Will use weight of 231 lbs (3/31) to estimate nutrition needs.  To begin Clinimix E 5/15 at 40 ml/h.  Diet Order:  Diet NPO time specified TPN (CLINIMIX-E) Adult  Skin:  Reviewed, no issues  Last BM:  3/31  Height:   Ht Readings from Last 1 Encounters:  11/28/2015 6' (1.829 m)    Weight:   Wt Readings from Last 1 Encounters:  12/03/15 263 lb 7.2 oz (119.5 kg)  11/29/15 231 lbs (104.7 kg)  Ideal Body Weight:  80.9 kg  BMI:  Body mass index is 35.72 kg/(m^2).            BMI=31.3 using weight from 3/30 (231 lbs)  Estimated Nutritional Needs:   Kcal:  [id number]  Protein:  161 gm  Fluid:  2 L  EDUCATION NEEDS:   No education needs identified at this time   Joaquin CourtsKimberly Tanganyika Bowlds, RD, LDN, CNSC Pager [phone] After Hours Pager [phone]

## 2015-12-03 NOTE — Progress Notes (Addendum)
PULMONARY / CRITICAL CARE MEDICINE HISTORY AND PHYSICAL EXAMINATION   Name: Matthew Costa MRN: [id number] DOB: 1965-11-29    ADMISSION DATE:  11/28/2015  PRIMARY SERVICE: PCCM  CHIEF COMPLAINT:  Swelling, confusion  BRIEF PATIENT DESCRIPTION: 50 y/o man with hx of EtOH abuse presenting with jaundice, swelling, and confusion  SIGNIFICANT EVENTS / STUDIES:  3/31- hypotensive.  4/1: intubated. Aspiration PNA on CXR. Had EGD: diffuse ulcerative changes t/o the esophagus and stomach. ? D/t ischemia vs toxic ingestion.   LINES / TUBES: PIV x3 R IJ TLC 4/1>>> OETT 4/1>>>  CULTURES: Blood x2 (3/31)>>> (-) MRSA 4/1 (-)  ANTIBIOTICS: Vanc 3/31 >> Zosyn 3/31 >>  SUBJECTIVE:  Sedated pm vent   VITAL SIGNS: Temp:  [97 F (36.1 C)-98.4 F (36.9 C)] 97 F (36.1 C) (04/03 1200) Pulse Rate:  [98-109] 109 (04/03 1208) Resp:  [14-25] 17 (04/03 1208) BP: (79-120)/(55-80) 83/56 mmHg (04/03 1208) SpO2:  [84 %-99 %] 92 % (04/03 1208) FiO2 (%):  [40 %-60 %] 60 % (04/03 1208) Weight:  [263 lb 7.2 oz (119.5 kg)] 263 lb 7.2 oz (119.5 kg) (04/03 0339) HEMODYNAMICS: CVP:  [15 mmHg-21 mmHg] 21 mmHg VENTILATOR SETTINGS: Vent Mode:  [-] PRVC FiO2 (%):  [40 %-60 %] 60 % Set Rate:  [15 bmp] 15 bmp Vt Set:  [620 mL] 620 mL PEEP:  [5 cmH20] 5 cmH20 Plateau Pressure:  [26 cmH20-30 cmH20] 30 cmH20 INTAKE / OUTPUT: Intake/Output      04/02 0701 - 04/03 0700 04/03 0701 - 04/04 0700   I.V. (mL/kg) 4552.8 (38.1) 954 (8)   Blood 1548.9    IV Piggyback 500    Total Intake(mL/kg) 6601.7 (55.2) 954 (8)   Urine (mL/kg/hr) 470 (0.2) 95 (0.1)   Emesis/NG output     Other 350 (0.1)    Total Output 820 95   Net +5781.7 +859         PHYSICAL EXAMINATION: General:   jaundiced male. Sedated on vent Neuro:  Sedated on vent . Not in distress.  HEENT:  Scleral icterus, MMM, blood around mouth and nose. Orally intubated Neck: No JVD.  Cardiovascular:  Normal, tachy  Lungs: diffuse rhonchi, no  accessory use Abdomen:  Obese, soft, no pain. + ascites. More distended  Musculoskeletal:  No joint swelling; equal st  Skin:  Numerous angiomata on face, nose. + anasarca.   LABS:  CBC  Recent Labs Lab 12/02/15 0916 12/02/15 1649 12/03/15 0430  WBC 8.9 7.3 7.4  HGB 8.8* 8.3* 8.6*  HCT 26.4* 24.1* 24.9*  PLT 75* 80* 79*   Coag's  Recent Labs Lab 2015/12/15 1739 12/02/15 0916 12/02/15 1649  APTT  --  39*  --   INR 2.24* 2.25* 1.75*   BMET  Recent Labs Lab 12/02/15 0100 12/02/15 0916 12/03/15 0430  NA 129* 130* 133*  K 3.2* 3.4* 3.4*  CL 99* 98* 102  CO2 21* 21* 21*  BUN 30* 33* 39*  CREATININE 2.60* 2.52* 2.23*  GLUCOSE 163* 151* 92   Electrolytes  Recent Labs Lab Dec 15, 2015 0043  12/02/15 0100 12/02/15 0916 12/03/15 0430  CALCIUM 7.7*  < > 7.7* 7.7* 7.9*  MG 1.9  --   --  2.2  --   PHOS 7.1*  < > 5.1* 4.8* 4.3  < > = values in this interval not displayed. Sepsis Markers  Recent Labs Lab 11/14/2015 2029 Dec 15, 2015 0128 12/02/15 0230  LATICACIDVEN >17.00* 16.79* 2.6*   ABG  Recent Labs Lab 12/15/2015 1049 2015-12-15  1746 12/02/15 0232  PHART 7.497* 7.371 7.438  PCO2ART 24.7* 40.5 36.2  PO2ART 88.0 265.0* 172.0*   Liver Enzymes  Recent Labs Lab 11/29/15 1554  11/07/2015 1912 2015/12/03 0043  12/02/15 0100 12/02/15 0916 12/03/15 0430  AST 73*  77*  --  121* 231*  --   --   --   --   ALT 31  37  --  <5* 58  --   --   --   --   ALKPHOS 189*  187*  --  104 85  --   --   --   --   BILITOT 4.1*  4.4*  --  7.6* 7.2*  --   --   --   --   ALBUMIN 2.6*  2.5*  < > 1.8* 1.4*  < > 2.3* 2.3* 2.3*  < > = values in this interval not displayed. Cardiac Enzymes  Recent Labs Lab 11/29/15 1554  PROBNP 224*   Glucose  Recent Labs Lab 11/20/2015 2012 11/18/2015 2036 11/09/2015 2039 11/26/2015 2138 12-03-15 0354  GLUCAP 26* 23* 119* 100* 109*    Imaging Dg Chest Port 1 View  12/03/2015  CLINICAL DATA:  Pneumonia. EXAM: PORTABLE CHEST 1 VIEW  COMPARISON:  12/02/2015. FINDINGS: Endotracheal tube and right IJ line stable position. Stable cardiomegaly. Progressive multifocal pulmonary infiltrates are noted suggesting pneumonia. Aspiration cannot be excluded. Pulmonary edema cannot be excluded. Small pleural effusions cannot be excluded. No pneumothorax. IMPRESSION: 1. Lines and tubes in stable position. 2. Progressive multifocal bilateral pulmonary infiltrates most likely secondary to pneumonia. Aspiration cannot be excluded. Electronically Signed   By: Maisie Fus  Register   On: 12/03/2015 07:26   Dg Chest Port 1 View  12/02/2015  CLINICAL DATA:  Multi system organ failure and respiratory failure. EXAM: PORTABLE CHEST 1 VIEW COMPARISON:  12-03-15 and prior exams FINDINGS: An endotracheal tube with tip 3.4 cm above the carina, right IJ central venous catheter with tip overlying the superior cavoatrial junction again noted. An NG tube has been removed. Slightly decreased lung volumes with right basilar atelectasis and left basilar atelectasis/ consolidation again noted. Mild pulmonary vascular congestion again identified. There is no evidence of pneumothorax. IMPRESSION: Slightly decreased lung volumes and NG tube removal, otherwise unchanged appearance of the chest. Electronically Signed   By: Harmon Pier M.D.   On: 12/02/2015 07:13   Dg Chest Port 1 View  Dec 03, 2015  CLINICAL DATA:  Endotracheal tube placement. History of hypertension, coronary artery disease, MI EXAM: PORTABLE CHEST 1 VIEW COMPARISON:  Chest x-ray from earlier same day. Also chest x-rays dated 11/28/2015 and 11/29/2015. FINDINGS: Endotracheal tube has been placed with tip well positioned approximately 2.5 cm above the carina. Enteric tube passes below the diaphragm. Right IJ central catheter is stable in position. Cardiomediastinal silhouette is stable in size and configuration. There is mild central pulmonary vascular congestion without frank pulmonary edema. Dense opacity at the left  lung base is probably atelectasis. IMPRESSION: 1. Endotracheal tube well positioned with tip approximately 2.5 cm above the carina. 2. New dense opacity at the left lung base, most likely atelectasis, alternatively aspiration. Suspect small adjacent pleural effusion. Persistent central pulmonary vascular congestion without frank pulmonary edema. Electronically Signed   By: Bary Richard M.D.   On: 03-Dec-2015 18:12    EKG: Sinus rhythm CXR: low lung volumes-->4/2 interval RLL airspace disease; likely reflects aspiration   ASSESSMENT / PLAN:  Active Problems:   Multisystem organ failure   MOSF (multiple organ systems failure)  Acute respiratory failure with hypoxia (HCC)   Elevated troponin   Acute liver failure   Acute renal failure with acute cortical necrosis (HCC)   Upper GI bleed   PULMONARY A: Ventilator dependence in setting of inability to protect airway Aspiration PNA  P:   Cont full vent support for now. Not ready for wean.  Cont PAD protocol  F/u PCXR in am   CARDIOVASCULAR A: Circulatory shock. Initially was hypovolemia, but then likely element of cardiogenic in setting of acidosis w/ related cardiac suppression. Now likely sedation related, bleeding +/- sepsis CAD.  Demand ischemia P:   MAP goal > 65 CVP goal 8-12 Fluids as indicated On pressors> levophed. Start vasopressin.  Holding anticoag given active bleed.  RENAL A: Acute renal failure: volume depletion, diuretics +/- NSAIDS-->creatinine w/out sig change Metabolic acidosis + AG in setting of lactic acidosis -->lactic acid better Hyponatremia  Hypokalemia  P:   CVP goal 8-12 Hold diuretics and antihypertensives Replace K  MAP goal >65 Liver is unable to metabolize lactate presently-->will d/c further lactate trends.  May need CVVH > holding off.    GASTROINTESTINAL A: Acute liver failure. 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  UGIB. EGD: finding showed-->severe necrosis of the  esophagus. Etiologies could includeischemic necrosis versus caustic ingestion. Gastric body with mild-to-moderate necrosis. No varices Necrotic Esophagus c/w ischemia vs toxic ingestion.  Ascites  P:   Cont PPI gtt NPO Start TPN. Cant insert NGT 2/2 necrotic esophagus.  No further endoscopic interventions as nothing else to offer. No surgical interventions given the diffuse nature of the findings.  Correct coags   HEMATOLOGIC A: Anemia from GIB s/p x-fusion PRBCs.  Coagulopathy in setting of liver dz  Mild thrombocytopenia  P:   F/u INR, goal <1.5-->transfuse as indicated  Serial cbc and coags Transfuse for hgb <7 OR for acute hgb changes in setting of acute bleeding  Will transfuse platelets  INFECTIOUS A: Sepsis.  ? SBP Aspiration PNA P:   Unclear source, cx pending. Tx w/ vanc+zosyn  ENDOCRINE A: No active issues P:   Trend glucose  BS q 4  NEUROLOGIC A: Hepatic encephalopathy  Probably element of w/d P:   Cont PAD protocol  Will eventually need lactulose but in setting of acute GIB will hold off     I spent 35 minutes of critical care time with this patient today. Wife updated at bedside. Poor prognosis over all.   Pollie MeyerJ. Angelo A de Dios, MD 12/03/2015, 1:32 PM [facility] Pager [phone] After 3 pm or if no answer, call [phone]

## 2015-12-03 NOTE — Progress Notes (Signed)
[facility] Progress Note  Subjective: Patient in ICU. Intubated. Being treated for multisystem organ failure. Recent EGD showed a necrotic-appearing esophagus.  Objective: Vital signs in last 24 hours: Temp:  [97 F (36.1 C)-98.4 F (36.9 C)] 97 F (36.1 C) (04/03 1000) Pulse Rate:  [98-109] 106 (04/03 1000) Resp:  [14-25] 18 (04/03 1000) BP: (79-130)/(55-80) 79/55 mmHg (04/03 1000) SpO2:  [84 %-99 %] 93 % (04/03 1000) FiO2 (%):  [40 %-60 %] 60 % (04/03 1030) Weight:  [119.5 kg (263 lb 7.2 oz)] 119.5 kg (263 lb 7.2 oz) (04/03 0339) Weight change: 3 kg (6 lb 9.8 oz)   PE:  Heart regular rhythm  Abdomen soft nontender  Lab Results: Results for orders placed or performed during the hospital encounter of 11/28/2015 (from the past 24 hour(s))  BLOOD TRANSFUSION REPORT - SCANNED     Status: None   Collection Time: 12/02/15 11:20 AM   Narrative   Ordered by an unspecified provider.  CBC     Status: Abnormal   Collection Time: 12/02/15  4:49 PM  Result Value Ref Range   WBC 7.3 4.0 - 10.5 K/uL   RBC 2.44 (L) 4.22 - 5.81 MIL/uL   Hemoglobin 8.3 (L) 13.0 - 17.0 g/dL   HCT 16.124.1 (L) 09.639.0 - 04.552.0 %   MCV 98.8 78.0 - 100.0 fL   MCH 34.0 26.0 - 34.0 pg   MCHC 34.4 30.0 - 36.0 g/dL   RDW 40.923.7 (H) 81.111.5 - 91.415.5 %   Platelets 80 (L) 150 - 400 K/uL  Protime-INR     Status: Abnormal   Collection Time: 12/02/15  4:49 PM  Result Value Ref Range   Prothrombin Time 20.4 (H) 11.6 - 15.2 seconds   INR 1.75 (H) 0.00 - 1.49  Renal function panel     Status: Abnormal   Collection Time: 12/03/15  4:30 AM  Result Value Ref Range   Sodium 133 (L) 135 - 145 mmol/L   Potassium 3.4 (L) 3.5 - 5.1 mmol/L   Chloride 102 101 - 111 mmol/L   CO2 21 (L) 22 - 32 mmol/L   Glucose, Bld 92 65 - 99 mg/dL   BUN 39 (H) 6 - 20 mg/dL   Creatinine, Ser 7.822.23 (H) 0.61 - 1.24 mg/dL   Calcium 7.9 (L) 8.9 - 10.3 mg/dL   Phosphorus 4.3 2.5 - 4.6 mg/dL   Albumin 2.3 (L) 3.5 - 5.0 g/dL   GFR calc non Af  Amer 33 (L) >60 mL/min   GFR calc Af Amer 39 (L) >60 mL/min   Anion gap 10 5 - 15  CBC     Status: Abnormal   Collection Time: 12/03/15  4:30 AM  Result Value Ref Range   WBC 7.4 4.0 - 10.5 K/uL   RBC 2.50 (L) 4.22 - 5.81 MIL/uL   Hemoglobin 8.6 (L) 13.0 - 17.0 g/dL   HCT 95.624.9 (L) 21.339.0 - 08.652.0 %   MCV 99.6 78.0 - 100.0 fL   MCH 34.4 (H) 26.0 - 34.0 pg   MCHC 34.5 30.0 - 36.0 g/dL   RDW 57.823.9 (H) 46.911.5 - 62.915.5 %   Platelets 79 (L) 150 - 400 K/uL    Studies/Results: Dg Chest Port 1 View  12/03/2015  CLINICAL DATA:  Pneumonia. EXAM: PORTABLE CHEST 1 VIEW COMPARISON:  12/02/2015. FINDINGS: Endotracheal tube and right IJ line stable position. Stable cardiomegaly. Progressive multifocal pulmonary infiltrates are noted suggesting pneumonia. Aspiration cannot be excluded. Pulmonary edema cannot be excluded. Small pleural effusions  cannot be excluded. No pneumothorax. IMPRESSION: 1. Lines and tubes in stable position. 2. Progressive multifocal bilateral pulmonary infiltrates most likely secondary to pneumonia. Aspiration cannot be excluded. Electronically Signed   By: Maisie Fus  Register   On: 12/03/2015 07:26      Assessment: Multisystem organ failure  Necrotic esophagus on recent EGD  History of alcohol abuse  Plan:   Continue supportive care per ICU team.    Gwenevere Abbot 12/03/2015, 10:53 AM  Pager: [phone] If no answer or after 5 PM call [phone]

## 2015-12-04 ENCOUNTER — Inpatient Hospital Stay (HOSPITAL_COMMUNITY): Payer: 59

## 2015-12-04 DIAGNOSIS — R140 Abdominal distension (gaseous): Secondary | ICD-10-CM | POA: Insufficient documentation

## 2015-12-04 LAB — CBC
HCT: 26.9 % — ABNORMAL LOW (ref 39.0–52.0)
HEMOGLOBIN: 8.9 g/dL — AB (ref 13.0–17.0)
MCH: 33.2 pg (ref 26.0–34.0)
MCHC: 33.1 g/dL (ref 30.0–36.0)
MCV: 100.4 fL — ABNORMAL HIGH (ref 78.0–100.0)
PLATELETS: 77 10*3/uL — AB (ref 150–400)
RBC: 2.68 MIL/uL — ABNORMAL LOW (ref 4.22–5.81)
RDW: 23.6 % — AB (ref 11.5–15.5)
WBC: 9 10*3/uL (ref 4.0–10.5)

## 2015-12-04 LAB — GLUCOSE, CAPILLARY
GLUCOSE-CAPILLARY: 108 mg/dL — AB (ref 65–99)
GLUCOSE-CAPILLARY: 100 mg/dL — AB (ref 65–99)
GLUCOSE-CAPILLARY: 95 mg/dL (ref 65–99)
GLUCOSE-CAPILLARY: 88 mg/dL (ref 65–99)
Glucose-Capillary: 116 mg/dL — ABNORMAL HIGH (ref 65–99)
Glucose-Capillary: 92 mg/dL (ref 65–99)

## 2015-12-04 LAB — COMPREHENSIVE METABOLIC PANEL
ALBUMIN: 1.9 g/dL — AB (ref 3.5–5.0)
ALT: 72 U/L — ABNORMAL HIGH (ref 17–63)
ANION GAP: 12 (ref 5–15)
AST: 209 U/L — ABNORMAL HIGH (ref 15–41)
Alkaline Phosphatase: 57 U/L (ref 38–126)
BILIRUBIN TOTAL: 10.2 mg/dL — AB (ref 0.3–1.2)
BUN: 45 mg/dL — ABNORMAL HIGH (ref 6–20)
CO2: 19 mmol/L — ABNORMAL LOW (ref 22–32)
Calcium: 7.5 mg/dL — ABNORMAL LOW (ref 8.9–10.3)
Chloride: 98 mmol/L — ABNORMAL LOW (ref 101–111)
Creatinine, Ser: 2.39 mg/dL — ABNORMAL HIGH (ref 0.61–1.24)
GFR calc non Af Amer: 31 mL/min — ABNORMAL LOW (ref >60)
GFR, EST AFRICAN AMERICAN: 36 mL/min — AB (ref >60)
GLUCOSE: 128 mg/dL — AB (ref 65–99)
POTASSIUM: 3.5 mmol/L (ref 3.5–5.1)
SODIUM: 129 mmol/L — AB (ref 135–145)
TOTAL PROTEIN: 4.8 g/dL — AB (ref 6.5–8.1)

## 2015-12-04 LAB — TYPE AND SCREEN
ABO/RH(D): A POS
Antibody Screen: NEGATIVE
UNIT DIVISION: 0
UNIT DIVISION: 0
UNIT DIVISION: 0
Unit division: 0
Unit division: 0
Unit division: 0

## 2015-12-04 LAB — HIV ANTIBODY (ROUTINE TESTING W REFLEX): HIV SCREEN 4TH GENERATION: NONREACTIVE

## 2015-12-04 LAB — PHOSPHORUS: PHOSPHORUS: 5 mg/dL — AB (ref 2.5–4.6)

## 2015-12-04 LAB — CK: Total CK: 248 U/L (ref 49–397)

## 2015-12-04 LAB — PROTIME-INR
INR: 2.2 — AB (ref 0.00–1.49)
PROTHROMBIN TIME: 24.2 s — AB (ref 11.6–15.2)

## 2015-12-04 LAB — MAGNESIUM: Magnesium: 1.8 mg/dL (ref 1.7–2.4)

## 2015-12-04 LAB — PREALBUMIN: PREALBUMIN: 3 mg/dL — AB (ref 18–38)

## 2015-12-04 LAB — TRIGLYCERIDES: Triglycerides: 97 mg/dL (ref <150)

## 2015-12-04 MED ORDER — FUROSEMIDE 10 MG/ML IJ SOLN
INTRAMUSCULAR | Status: AC
  Administered 2015-12-04: 80 mg via INTRAVENOUS
  Filled 2015-12-04: qty 8

## 2015-12-04 MED ORDER — ZINC TRACE METAL 1 MG/ML IV SOLN
INTRAVENOUS | Status: AC
  Administered 2015-12-04: 18:00:00 via INTRAVENOUS
  Filled 2015-12-04: qty 1440

## 2015-12-04 MED ORDER — ZINC TRACE METAL 1 MG/ML IV SOLN
INTRAVENOUS | Status: DC
  Filled 2015-12-04: qty 1440

## 2015-12-04 MED ORDER — FUROSEMIDE 10 MG/ML IJ SOLN
80.0000 mg | Freq: Once | INTRAMUSCULAR | Status: AC
  Administered 2015-12-04: 80 mg via INTRAVENOUS

## 2015-12-04 NOTE — Progress Notes (Signed)
eLink Physician-Brief Progress Note Patient Name: Matthew Costa DOB: 30-Jul-1966 MRN: [id number]   Date of Service  12/04/2015  HPI/Events of Note  hyxpoxia Mucous plug?  eICU Interventions  RT bagging lavage x 2 Peep to 12 Attempt recruitment Has equal BS and hemodynamics wnl Stat pcxr     Intervention Category Major Interventions: Hypoxemia - evaluation and management  FEINSTEIN,DANIEL J. 12/04/2015, 1:02 AM

## 2015-12-04 NOTE — Progress Notes (Signed)
[facility] FOR AM LAB REPLACEMENT ONLY  The patient does not apply for the [facility] Electrolyte Re[facility] Protocol based on the criteria listed below:    Is urine output >/= 0.5 ml/kg/hr for the last 6 hours? No. Patient's UOP is 0 ml/kg/hr   Abnormal electrolyte(s): K3.5  If a panic level lab has been reported, has the CCM MD in charge been notified? Yes.  .   Physician:  Wilford Sports Feinstein, MD  [place] NakayamaChisholm, Laquanta Hummel William 12/04/2015 6:10 AM

## 2015-12-04 NOTE — Progress Notes (Signed)
RT was called to patient bedside. Patient desat into low 70s. RT bagged lavaged patient several times. Dr. Tyson AliasFeinstein was called. RT was told by MD to placed patient on PCV  P15 RR22 PEEP 18 and 100%. Patient is tolerating well. Sat 96%. RT will continue to monitor.

## 2015-12-04 NOTE — Progress Notes (Signed)
    Subjective:  Intubated and sedated. Unresponsive.   Objective:  Filed Vitals:   12/04/15 0730 12/04/15 0745 12/04/15 0800 12/04/15 0823  BP: 93/70 91/68 89/61  95/65  Pulse: 117 117 115 115  Temp: 98.4 F (36.9 C) 98.2 F (36.8 C) 98.2 F (36.8 C)   TempSrc:   Core (Comment)   Resp: 21 21 26 28   Height:      Weight:      SpO2: 95% 94% 94% 95%    Intake/Output from previous day:  Intake/Output Summary (Last 24 hours) at 12/04/15 0908 Last data filed at 12/04/15 0800  Gross per 24 hour  Intake 3945.34 ml  Output    315 ml  Net 3630.34 ml    Physical Exam: Physical exam: Well-developed well-nourished intubated and sedated Skin is warm and dry.  HEENT is normal.  Neck is supple.  Chest Coarse rhonchi Cardiovascular exam is regular rate and rhythm. No gallop or murmur. Abdominal exam distended and tense Extremities show 3+ edema. neuro - intubated and sedated    Lab Results: Basic Metabolic Panel:  Recent Labs  16/06/9603/02/17 0916 12/03/15 0430 12/04/15 0350  NA 130* 133* 129*  K 3.4* 3.4* 3.5  CL 98* 102 98*  CO2 21* 21* 19*  GLUCOSE 151* 92 128*  BUN 33* 39* 45*  CREATININE 2.52* 2.23* 2.39*  CALCIUM 7.7* 7.9* 7.5*  MG 2.2  --  1.8  PHOS 4.8* 4.3 5.0*   CBC:  Recent Labs  12/03/15 1859 12/04/15 0350  WBC 6.9 9.0  HGB 8.6* 8.9*  HCT 25.9* 26.9*  MCV 99.2 100.4*  PLT 79* 77*     Assessment/Plan:  1 non-ST elevation myocardial infarction-istat troponin 1.41 on admission. No other troponins checked. Patient's troponin is elevated likely secondary to demand ischemia. He is not a candidate for aggressive cardiac evaluation due to multisystem organ failure. We cannot add aspirin or heparin due to  GI bleed. We cannot add beta blockade or other medications because of hypotension and shock.  2 VDRF-management per CCM 3 Acute renal failure 4 Cirrhosis/acute liver failure 5 Alcohol abuse 6 Volume overload-likely multifactorial including acute renal  failure, cirrhosis with low albumin with possible cardiac contribution. Apparently Echo never ordered.  7 GI bleed-secondary to esophageal necrosis. Follow hemoglobin. S/p EGD-esophageal ulcerations felt c/w ischemia or toxic ingestion. 8 Coagulopathy 9 Sepsis  Patient had further decline last night with progressive hypoxemia. Now on 100% FiO2 and pretty maxed out on Levophed and neosynephrine. Still hypotensive. I doubt an Echo at this point will make any difference. If CCM still wants we can order. Prognosis looks very poor.  Peter [facility] LtdJordanMD,FACC 12/04/2015, 9:08 AM

## 2015-12-04 NOTE — Progress Notes (Addendum)
PULMONARY / CRITICAL CARE MEDICINE    Name: Matthew Costa MRN: [id number] DOB: 1965/10/21    ADMISSION DATE:  12-26-2015  PRIMARY SERVICE: PCCM  CHIEF COMPLAINT:  Swelling, confusion  BRIEF PATIENT DESCRIPTION: 50 y/o man with hx of EtOH abuse presenting with jaundice, swelling, and confusion.  SIGNIFICANT EVENTS / STUDIES:  3/31- hypotensive.  4/1: intubated. Aspiration PNA on CXR. Had EGD: diffuse ulcerative changes t/o the esophagus and stomach. ? D/t ischemia vs toxic ingestion.   LINES / TUBES: PIV x3 R IJ TLC 4/1>>> OETT 4/1>>>  CULTURES: Blood x2 (3/31)>>> (-) MRSA 4/1 (-) Urine Cultures 3/31 >> NG final  ANTIBIOTICS: Vanc 3/31 >> 4/1 Zosyn 3/31 >>  SUBJECTIVE:  Sedated pm vent . Overnight became hypoxic, also tachycardic. Vent setting adjusted, now on pressure support.  VITAL SIGNS: Temp:  [97 F (36.1 C)-98.4 F (36.9 C)] 98.4 F (36.9 C) (04/04 0730) Pulse Rate:  [106-117] 117 (04/04 0730) Resp:  [16-33] 21 (04/04 0730) BP: (73-129)/(49-91) 93/70 mmHg (04/04 0730) SpO2:  [85 %-98 %] 95 % (04/04 0730) FiO2 (%):  [40 %-100 %] 100 % (04/04 0400) Weight:  [273 lb 9.5 oz (124.1 kg)] 273 lb 9.5 oz (124.1 kg) (04/04 0330) HEMODYNAMICS: CVP:  [20 mmHg-21 mmHg] 21 mmHg VENTILATOR SETTINGS: Vent Mode:  [-] PCV FiO2 (%):  [40 %-100 %] 100 % Set Rate:  [15 bmp-22 bmp] 22 bmp Vt Set:  [620 mL] 620 mL PEEP:  [5 cmH20-18 cmH20] 18 cmH20 Plateau Pressure:  [28 cmH20-32 cmH20] 32 cmH20 INTAKE / OUTPUT: Intake/Output      04/03 0701 - 04/04 0700 04/04 0701 - 04/05 0700   I.V. (mL/kg) 4086.4 (32.9)    Blood     IV Piggyback 150    Total Intake(mL/kg) 4236.4 (34.1)    Urine (mL/kg/hr) 340 (0.1)    Other     Total Output 340     Net +3896.4           PHYSICAL EXAMINATION: General:  Sedated on vent Neuro:  Sedated on vent . Not in distress.  HEENT:  Scleral icterus, MMM, blood around mouth and nose. Orally intubated. Bloody fluid from mouth present.   Cardiovascular:  Normal, tachy ,no murmurs appreciated. Lungs: diffuse rhonchi, on vent. Abdomen:  Obese, firm, no pain. + ascites. Distended. Musculoskeletal: Bilateral marked edema. Skin:  Numerous angiomata on face, nose. + anasarca.   LABS:  CBC  Recent Labs Lab 12/03/15 0430 12/03/15 1859 12/04/15 0350  WBC 7.4 6.9 9.0  HGB 8.6* 8.6* 8.9*  HCT 24.9* 25.9* 26.9*  PLT 79* 79* 77*   Coag's  Recent Labs Lab 12/02/15 0916 12/02/15 1649 12/04/15 0350  APTT 39*  --   --   INR 2.25* 1.75* 2.20*   BMET  Recent Labs Lab 12/02/15 0916 12/03/15 0430 12/04/15 0350  NA 130* 133* 129*  K 3.4* 3.4* 3.5  CL 98* 102 98*  CO2 21* 21* 19*  BUN 33* 39* 45*  CREATININE 2.52* 2.23* 2.39*  GLUCOSE 151* 92 128*   Electrolytes  Recent Labs Lab 12/29/2015 0043  12/02/15 0916 12/03/15 0430 12/04/15 0350  CALCIUM 7.7*  < > 7.7* 7.9* 7.5*  MG 1.9  --  2.2  --  1.8  PHOS 7.1*  < > 4.8* 4.3 5.0*  < > = values in this interval not displayed. Sepsis Markers  Recent Labs Lab December 26, 2015 2029 12/16/2015 0128 12/02/15 0230  LATICACIDVEN >17.00* 16.79* 2.6*   ABG  Recent Labs Lab 12/25/2015  1049 12/22/2015 1746 12/02/15 0232  PHART 7.497* 7.371 7.438  PCO2ART 24.7* 40.5 36.2  PO2ART 88.0 265.0* 172.0*   Liver Enzymes  Recent Labs Lab 06-29-2016 1912 12/17/2015 0043  12/02/15 0916 12/03/15 0430 12/04/15 0350  AST 121* 231*  --   --   --  209*  ALT <5* 58  --   --   --  72*  ALKPHOS 104 85  --   --   --  57  BILITOT 7.6* 7.2*  --   --   --  10.2*  ALBUMIN 1.8* 1.4*  < > 2.3* 2.3* 1.9*  < > = values in this interval not displayed. Cardiac Enzymes  Recent Labs Lab 11/29/15 1554  PROBNP 224*   Glucose  Recent Labs Lab 12/06/2015 0354 12/03/15 1557 12/03/15 1628 12/03/15 2040 12/03/15 2325 12/04/15 0344  GLUCAP 109* 52* 117* 91 108* 116*    Imaging Dg Chest Port 1 View  12/04/2015  CLINICAL DATA:  Hypoxia EXAM: PORTABLE CHEST 1 VIEW COMPARISON:  12/03/2015  FINDINGS: The endotracheal tube is 4.3 cm above the carina. Right jugular central line is unchanged, tip at the cavoatrial junction. Diffuse alveolar and interstitial opacities persist throughout both lungs without significant interval change. More dense confluent consolidation persists in the left base. No pneumothorax. IMPRESSION: Support equipment appears satisfactorily positioned. No significant interval change in the bilateral airspace opacities. Electronically Signed   By: Ellery Plunkaniel R Mitchell M.D.   On: 12/04/2015 01:48   Dg Chest Port 1 View  12/03/2015  CLINICAL DATA:  Pneumonia. EXAM: PORTABLE CHEST 1 VIEW COMPARISON:  12/02/2015. FINDINGS: Endotracheal tube and right IJ line stable position. Stable cardiomegaly. Progressive multifocal pulmonary infiltrates are noted suggesting pneumonia. Aspiration cannot be excluded. Pulmonary edema cannot be excluded. Small pleural effusions cannot be excluded. No pneumothorax. IMPRESSION: 1. Lines and tubes in stable position. 2. Progressive multifocal bilateral pulmonary infiltrates most likely secondary to pneumonia. Aspiration cannot be excluded. Electronically Signed   By: Maisie Fushomas  Register   On: 12/03/2015 07:26    EKG: Sinus rhythm CXR: low lung volumes-->4/2 interval RLL airspace disease; likely reflects aspiration   ASSESSMENT / PLAN:  Active Problems:   Multisystem organ failure   MOSF (multiple organ systems failure)   Acute respiratory failure with hypoxia (HCC)   Elevated troponin   Acute liver failure   Acute renal failure with acute cortical necrosis (HCC)   Upper GI bleed   Anuria   Elevated liver enzymes  PULMONARY A: Ventilator dependence in setting of inability to protect airway now on Pressure support- PEEP- 18, Fio2- 100% Aspiration PNA  Multi-organ failure- hepatic, renal, neuro. P:   Cont full vent support for now. Not ready for wean.  Cont PAD protocol  F/u PCXR in am  Pt Still hypoxic with O2 sats-  88%  CARDIOVASCULAR A: Circulatory shock. Initially was hypovolemia, but then likely element of cardiogenic in setting of acidosis w/ related cardiac suppression. Now likely sedation related, bleeding +/- sepsis CAD.  Demand ischemia P:   MAP goal > 65 CVP goal 8-12 Fluids as indicated On pressors> levophed and vasopressin.  Holding anticoag given active bleed.  RENAL A: Acute renal failure: volume depletion, diuretics +/- NSAIDS-->creatinine w/out sig change Metabolic acidosis + AG in setting of lactic acidosis -->lactic acid better Hyponatremia  Hypokalemia  Oliguric, positive 20L P:   CVP goal 8-12 Hold diuretics and antihypertensives Replace K  MAP goal >65 Liver is unable to metabolize lactate presently-->will d/c further lactate trends.  Will talk to nephrology.    GASTROINTESTINAL A: Acute liver failure. 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  UGIB. EGD: finding showed-->severe necrosis of the esophagus. Etiologies could includeischemic necrosis versus caustic ingestion. Gastric body with mild-to-moderate necrosis. No varices Necrotic Esophagus c/w ischemia vs toxic ingestion.  Ascites  P:   Cont PPI gtt NPO Start TPN. Cant insert NGT 2/2 necrotic esophagus.  No further endoscopic interventions as nothing else to offer. No surgical interventions given the diffuse nature of the findings.  Correct coags  Not liver transplant candidate with alcohol use. Avoid antiplt and anticoag GI recs appreciated, has Signed off.  HEMATOLOGIC A: Anemia from GIB s/p x-fusion PRBCs. With stable Hgb at ~8. Coagulopathy in setting of liver dz  Thrombocytopenia  P:   F/u INR, goal <1.5-->transfuse as indicated  Serial cbc and coags Transfuse for hgb <7 OR for acute hgb changes in setting of acute bleeding   INFECTIOUS A: Sepsis.  ? SBP Aspiration PNA P:   Unclear source, cx pending.  Cont zosyn  ENDOCRINE A: No active issues P:   Trend glucose  BS  q 4  NEUROLOGIC A: Hepatic encephalopathy  Probably element of w/d P:   Cont PAD protocol  Will eventually need lactulose but in setting of acute GIB will hold off   Consideration to withdraw care soon, as prognosis is Grim.    Inocente Salles,  PGY-3 IMTS. [phone].

## 2015-12-04 NOTE — Progress Notes (Signed)
eLink Physician-Brief Progress Note Patient Name: Matthew BrownieChristopher E Costa DOB: 08-27-1966 MRN: [id number]   Date of Service  12/04/2015  HPI/Events of Note  Improve then desat again  eICU Interventions  Change to PCV Start ratio 1:1     Intervention Category Major Interventions: Hypoxemia - evaluation and management  FEINSTEIN,DANIEL J. 12/04/2015, 1:18 AM

## 2015-12-04 NOTE — Progress Notes (Signed)
PARENTERAL NUTRITION CONSULT NOTE - Follow Up  Pharmacy Consult for TPN Indication: Necrotic esophagus  No Known Allergies  Patient Measurements: Height: 6' (182.9 cm) Weight: 273 lb 9.5 oz (124.1 kg) IBW/kg (Calculated) : 77.6 Adjusted Body Weight: 90 kg Usual Weight:   Vital Signs: Temp: 98.2 F (36.8 C) (04/04 0800) Temp Source: Core (Comment) (04/04 0800) BP: 89/61 mmHg (04/04 0800) Pulse Rate: 115 (04/04 0800) Intake/Output from previous day: 04/03 0701 - 04/04 0700 In: 4236.4 [I.V.:4086.4; IV Piggyback:150] Out: 340 [Urine:340] Intake/Output from this shift: Total I/O In: 90.5 [I.V.:90.5] Out: -   Labs:  Recent Labs  12/02/15 0916 12/02/15 1649 12/03/15 0430 12/03/15 1859 12/04/15 0350  WBC 8.9 7.3 7.4 6.9 9.0  HGB 8.8* 8.3* 8.6* 8.6* 8.9*  HCT 26.4* 24.1* 24.9* 25.9* 26.9*  PLT 75* 80* 79* 79* 77*  APTT 39*  --   --   --   --   INR 2.25* 1.75*  --   --  2.20*     Recent Labs  12/02/15 0916 12/03/15 0430 12/04/15 0350  NA 130* 133* 129*  K 3.4* 3.4* 3.5  CL 98* 102 98*  CO2 21* 21* 19*  GLUCOSE 151* 92 128*  BUN 33* 39* 45*  CREATININE 2.52* 2.23* 2.39*  CALCIUM 7.7* 7.9* 7.5*  MG 2.2  --  1.8  PHOS 4.8* 4.3 5.0*  PROT  --   --  4.8*  ALBUMIN 2.3* 2.3* 1.9*  AST  --   --  209*  ALT  --   --  72*  ALKPHOS  --   --  57  BILITOT  --   --  10.2*  PREALBUMIN  --   --  3.0*  TRIG  --   --  97   Estimated Creatinine Clearance: 52 mL/min (by C-G formula based on Cr of 2.39).    Recent Labs  12/03/15 2040 12/03/15 2325 12/04/15 0344  GLUCAP 91 108* 116*    Medical History: Past Medical History  Diagnosis Date  . Hypertension   . Hyperlipidemia   . Coronary artery disease   . Myocardial infarction (HCC)   . Depression     PT IS ALCOHOLIC- RECOVERING- COMPLETED 30 DAY FELLOWSHIP HALL REHAB APRIL 2014 - NO ALCOHOL SINCE  . Arthritis     SPINE  . HNP (herniated nucleus pulposus)     LUMBAR L5-S1- PAIN IS IN RT BUTTOCK AND DOWN RT  LEG TO FEET- TINGLING IN FEET  . ETOH abuse      Insulin Requirements in the past 24 hours:  None  Assessment: 50 y/o M alcoholic referred to ED by PCP for jaundice, abdominal distention, edema, intractable N/V (black, coffee ground). Pt in acute liver failure, ARF, elevated LA, hypoglycemia, UGIB with AMS. Elevated troponins.  PMH: h/o CAD 12/13, noncompliance, alcoholism  GI: GIB from esophageal necrosis.S/p EGD-esophageal ulcerations felt c/w ischemia or toxic ingestion. No NG or OG tubes. Currently on IV Protonix infusion. Albumin low at 1.9, Prealbumin 3.  Endo: 92-128, no h/o DM noted. On SSI   Lytes: K=3.5, Phos 5, Mg 1.8.  Renal: ARF. Scr 2.39. UOP only 0.1/24h  Pulm: VDRF   Cards: h/o CAD. New NSTEMI likely from demand ischemia. Norepi drip  Hepatobil: Acute liver failure with thrombocytopenia, elevated INR.  Tbili up to 10.2, AST 209, ALT 72  Neuro: alcohol abuse, on Fentanyl infusion, prn versed for agitation, CPOT 0, GCS 8, RASS -3  ID: Sepsis on Zosyn. WBC wnl, BC NGTD.  Best Practices: SCDs, PPI infusion  TPN Access: CVC Triple Lumen December 19, 2015  TPN start date: 12/03/2015   Nutritional Goals:  Kcal: 6045-4098 Protein: 161 gm  Current Nutrition:  Clinimix E 5/15 at 40 mL/hr    Plan:  -Increase Clinimix E 5/15 to 13ml/hr. This will provide 1414 Kcal and 100 g of protein. Advance to goal as tolerated  -D#2/7 of holding IV Lipids due to acute illness in ICU  -Monitor CBGs on SSI  -Hold trace elements. Add Zinc 5 mg/day, Chromium 10 mcg/day and Selenium 60 mcg/day -Add MVI to TPN  -F/u TPN labs tomorrow    Vinnie Level, PharmD., BCPS Clinical Pharmacist Pager [phone]

## 2015-12-04 NOTE — Progress Notes (Signed)
From a GI standpoint I don't think there is anything more we can do. Will sign off. Call if needed.

## 2015-12-05 DIAGNOSIS — R69 Illness, unspecified: Secondary | ICD-10-CM

## 2015-12-05 DIAGNOSIS — K7290 Hepatic failure, unspecified without coma: Secondary | ICD-10-CM

## 2015-12-05 LAB — CBC
HEMATOCRIT: 27 % — AB (ref 39.0–52.0)
HEMOGLOBIN: 8.6 g/dL — AB (ref 13.0–17.0)
MCH: 33 pg (ref 26.0–34.0)
MCHC: 31.9 g/dL (ref 30.0–36.0)
MCV: 103.4 fL — AB (ref 78.0–100.0)
PLATELETS: 77 10*3/uL — AB (ref 150–400)
RBC: 2.61 MIL/uL — AB (ref 4.22–5.81)
RDW: 23.3 % — AB (ref 11.5–15.5)
WBC: 19.1 10*3/uL — ABNORMAL HIGH (ref 4.0–10.5)

## 2015-12-05 LAB — COMPREHENSIVE METABOLIC PANEL
ALBUMIN: 1.7 g/dL — AB (ref 3.5–5.0)
ALK PHOS: 52 U/L (ref 38–126)
ALT: 64 U/L — AB (ref 17–63)
ANION GAP: 10 (ref 5–15)
AST: 175 U/L — ABNORMAL HIGH (ref 15–41)
BUN: 51 mg/dL — ABNORMAL HIGH (ref 6–20)
CHLORIDE: 99 mmol/L — AB (ref 101–111)
CO2: 21 mmol/L — AB (ref 22–32)
CREATININE: 3.32 mg/dL — AB (ref 0.61–1.24)
Calcium: 7.8 mg/dL — ABNORMAL LOW (ref 8.9–10.3)
GFR calc non Af Amer: 21 mL/min — ABNORMAL LOW (ref >60)
GFR, EST AFRICAN AMERICAN: 24 mL/min — AB (ref >60)
GLUCOSE: 109 mg/dL — AB (ref 65–99)
Potassium: 4.4 mmol/L (ref 3.5–5.1)
SODIUM: 130 mmol/L — AB (ref 135–145)
Total Bilirubin: 11.2 mg/dL — ABNORMAL HIGH (ref 0.3–1.2)
Total Protein: 4.4 g/dL — ABNORMAL LOW (ref 6.5–8.1)

## 2015-12-05 LAB — POCT I-STAT 3, ART BLOOD GAS (G3+)
ACID-BASE DEFICIT: 8 mmol/L — AB (ref 0.0–2.0)
BICARBONATE: 21.5 meq/L (ref 20.0–24.0)
O2 SAT: 81 %
PCO2 ART: 65.3 mmHg — AB (ref 35.0–45.0)
PO2 ART: 59 mmHg — AB (ref 80.0–100.0)
Patient temperature: 97.5
TCO2: 24 mmol/L (ref 0–100)
pH, Arterial: 7.122 — CL (ref 7.350–7.450)

## 2015-12-05 LAB — GLUCOSE, CAPILLARY
GLUCOSE-CAPILLARY: 103 mg/dL — AB (ref 65–99)
Glucose-Capillary: 95 mg/dL (ref 65–99)
Glucose-Capillary: 110 mg/dL — ABNORMAL HIGH (ref 65–99)
Glucose-Capillary: 104 mg/dL — ABNORMAL HIGH (ref 65–99)

## 2015-12-05 LAB — HEPATITIS PANEL, ACUTE
HEP B S AG: NEGATIVE
Hep A IgM: NEGATIVE
Hep B C IgM: NEGATIVE

## 2015-12-05 LAB — PHOSPHORUS: PHOSPHORUS: 7.1 mg/dL — AB (ref 2.5–4.6)

## 2015-12-05 LAB — MAGNESIUM: MAGNESIUM: 2 mg/dL (ref 1.7–2.4)

## 2015-12-05 MED ORDER — ZINC TRACE METAL 1 MG/ML IV SOLN
INTRAVENOUS | Status: AC
  Administered 2015-12-05: 18:00:00 via INTRAVENOUS
  Filled 2015-12-05: qty 1440

## 2015-12-05 MED ORDER — MIDAZOLAM BOLUS VIA INFUSION
1.0000 mg | INTRAVENOUS | Status: DC | PRN
  Filled 2015-12-05: qty 2

## 2015-12-05 MED ORDER — SODIUM CHLORIDE 0.9 % IV SOLN
0.0000 mg/h | INTRAVENOUS | Status: DC
  Administered 2015-12-05: 2 mg/h via INTRAVENOUS
  Administered 2015-12-06: 3 mg/h via INTRAVENOUS
  Filled 2015-12-05 (×4): qty 10

## 2015-12-05 MED ORDER — BUMETANIDE 0.25 MG/ML IJ SOLN
1.0000 mg | Freq: Once | INTRAMUSCULAR | Status: AC
  Administered 2015-12-05: 1 mg via INTRAVENOUS
  Filled 2015-12-05: qty 4

## 2015-12-05 MED ORDER — PANTOPRAZOLE SODIUM 40 MG IV SOLR
40.0000 mg | INTRAVENOUS | Status: DC
  Administered 2015-12-05 – 2015-12-07 (×3): 40 mg via INTRAVENOUS
  Filled 2015-12-05 (×3): qty 40

## 2015-12-05 NOTE — Progress Notes (Signed)
    Subjective:  Intubated and sedated. Unresponsive.   Objective:  Filed Vitals:   12/05/15 0715 12/05/15 0725 12/05/15 0730 12/05/15 0745  BP: 87/60  81/54 90/58  Pulse: 77  36 106  Temp: 97.5 F (36.4 C)  97.5 F (36.4 C) 97.5 F (36.4 C)  TempSrc:      Resp: 35  35 35  Height:      Weight:      SpO2: 100% 100% 99% 100%    Intake/Output from previous day:  Intake/Output Summary (Last 24 hours) at 12/05/15 0839 Last data filed at 12/05/15 0700  Gross per 24 hour  Intake 2113.14 ml  Output     55 ml  Net 2058.14 ml    Physical Exam: Physical exam: Well-developed well-nourished intubated and sedated Skin is warm and dry.  HEENT is normal.  Neck is supple.  Chest Coarse rhonchi Cardiovascular exam is regular rate and rhythm. No gallop or murmur. Abdominal exam distended and tense Extremities show 3-4+ edema/anasarca. neuro - intubated and sedated    Lab Results: Basic Metabolic Panel:  Recent Labs  81/19/1402/12/17 0350 12/05/15 0333 12/05/15 0334  NA 129*  --  130*  K 3.5  --  4.4  CL 98*  --  99*  CO2 19*  --  21*  GLUCOSE 128*  --  109*  BUN 45*  --  51*  CREATININE 2.39*  --  3.32*  CALCIUM 7.5*  --  7.8*  MG 1.8 2.0  --   PHOS 5.0* 7.1*  --    CBC:  Recent Labs  12/04/15 0350 12/05/15 0333  WBC 9.0 19.1*  HGB 8.9* 8.6*  HCT 26.9* 27.0*  MCV 100.4* 103.4*  PLT 77* 77*     Assessment/Plan:  1 non-ST elevation myocardial infarction-istat troponin 1.41 on admission. No other troponins checked. Patient's troponin is elevated likely secondary to demand ischemia. He is not a candidate for aggressive cardiac evaluation due to multisystem organ failure. We cannot add aspirin or heparin due to  GI bleed. We cannot add beta blockade or other medications because of hypotension and shock.  2 VDRF-management per CCM 3 Acute renal failure 4 Cirrhosis/acute liver failure 5 Alcohol abuse 6 Volume overload-likely multifactorial including acute renal  failure, cirrhosis with low albumin with possible cardiac contribution. Apparently Echo never ordered. Patient is positive 22 liters since admit and weight up 40 lbs. Severe anasarca.  Urine output is poor.  7 GI bleed-secondary to esophageal necrosis/ulceration. Follow hemoglobin. S/p EGD-esophageal ulcerations felt c/w ischemia or toxic ingestion. 8 Coagulopathy 9 Sepsis Patient is still requiring high dose pressor support. On maximal vent support with persistent hypoxia and severe acidosis.  Still hypotensive.  Prognosis looks very grim with multisystem failure.  Matthew Costa,Matthew Costa 12/05/2015, 8:39 AM

## 2015-12-05 NOTE — Progress Notes (Addendum)
PARENTERAL NUTRITION CONSULT NOTE - Follow Up  Pharmacy Consult for TPN Indication: Necrotic esophagus  No Known Allergies  Patient Measurements: Height: 6' (182.9 cm) Weight: 278 lb 10.6 oz (126.4 kg) IBW/kg (Calculated) : 77.6 Adjusted Body Weight: 90 kg Usual Weight:   Vital Signs: Temp: 97.5 F (36.4 C) (04/05 0745) BP: 90/58 mmHg (04/05 0745) Pulse Rate: 106 (04/05 0745) Intake/Output from previous day: 04/04 0701 - 04/05 0700 In: 2203.6 [I.V.:2091.1; IV Piggyback:112.5] Out: 90 [Urine:90] Intake/Output from this shift:    Labs:  Recent Labs  12/02/15 0916 12/02/15 1649  12/03/15 1859 12/04/15 0350 12/05/15 0333  WBC 8.9 7.3  < > 6.9 9.0 19.1*  HGB 8.8* 8.3*  < > 8.6* 8.9* 8.6*  HCT 26.4* 24.1*  < > 25.9* 26.9* 27.0*  PLT 75* 80*  < > 79* 77* 77*  APTT 39*  --   --   --   --   --   INR 2.25* 1.75*  --   --  2.20*  --   < > = values in this interval not displayed.   Recent Labs  12/02/15 0916 12/03/15 0430 12/04/15 0350 12/05/15 0333 12/05/15 0334  NA 130* 133* 129*  --  130*  K 3.4* 3.4* 3.5  --  4.4  CL 98* 102 98*  --  99*  CO2 21* 21* 19*  --  21*  GLUCOSE 151* 92 128*  --  109*  BUN 33* 39* 45*  --  51*  CREATININE 2.52* 2.23* 2.39*  --  3.32*  CALCIUM 7.7* 7.9* 7.5*  --  7.8*  MG 2.2  --  1.8 2.0  --   PHOS 4.8* 4.3 5.0* 7.1*  --   PROT  --   --  4.8*  --  4.4*  ALBUMIN 2.3* 2.3* 1.9*  --  1.7*  AST  --   --  209*  --  175*  ALT  --   --  72*  --  64*  ALKPHOS  --   --  57  --  52  BILITOT  --   --  10.2*  --  11.2*  PREALBUMIN  --   --  3.0*  --   --   TRIG  --   --  97  --   --    Estimated Creatinine Clearance: 37.8 mL/min (by C-G formula based on Cr of 3.32).    Recent Labs  12/04/15 1901 12/04/15 2315 12/05/15 0608  GLUCAP 88 95 110*    Medical History: Past Medical History  Diagnosis Date  . Hypertension   . Hyperlipidemia   . Coronary artery disease   . Myocardial infarction (HCC)   . Depression     PT IS  ALCOHOLIC- RECOVERING- COMPLETED 30 DAY FELLOWSHIP HALL REHAB APRIL 2014 - NO ALCOHOL SINCE  . Arthritis     SPINE  . HNP (herniated nucleus pulposus)     LUMBAR L5-S1- PAIN IS IN RT BUTTOCK AND DOWN RT LEG TO FEET- TINGLING IN FEET  . ETOH abuse     Insulin Requirements in the past 24 hours:  None  Assessment: 50 y/o M alcoholic referred to ED by PCP for jaundice, abdominal distention, edema, intractable N/V (black, coffee ground). Pt in acute liver failure, ARF, elevated LA, hypoglycemia, UGIB with AMS. Elevated troponins.  PMH: h/o CAD 12/13, noncompliance, alcoholism  GI: GIB from esophageal necrosis. S/p EGD-esophageal ulcerations felt c/w ischemia or toxic ingestion. No NG or OG tubes d/t necrotic  esophagus, no surgical intervention. IV Protonix infusion d/c'd - no PPI added yet. Albumin low at 1.7, Prealbumin 3. LBM 3/31  Endo: CBGs 95-110, no h/o DM noted. On SSI   Lytes: K 4.4, Phos up 7.1, Mg 2, coCa~9.6 (Ca x Phos ~68)  Renal: ARF. Scr 2.39>3.32. UOP only 90cc/24h. Lasix x1 4/4. MD to get Renal recs. BUN ok 51  Pulm: VDRF. Worsening respiratory status (fi02 100)  Cards: h/o CAD. New NSTEMI likely from demand ischemia. Norepi, VP drips  Hepatobil: Acute liver failure with thrombocytopenia, elevated INR.  Tbili up to 11.2, LFTs trend down - AST 175, ALT 64. TG wnl  Neuro: alcohol abuse, on Fentanyl/Versed infusions, unresponsive. CPOT 0, GCS 7, RASS -3 (goal 0)  ID: Sepsis on Zosyn. AF, WBC up to 19.1, BC NGTD, UC neg.  Best Practices: SCDs  TPN Access: CVC Triple Lumen 12/06/2015  TPN start date: 12/03/2015   Nutritional Goals:  Kcal: 1610-9604 Protein: 161 gm  Current Nutrition:  Clinimix E 5/15 at 60 mL/h - provides 1414 kcal + 100g protein   Plan:  - Clinimix (NO LYTES) 5/15 at 46ml/h. This will provide 1414 Kcal and 100 g of protein. Will be unable to meet protein goals due to hypocaloric feeding and Clinimix limitations   -D#3/7 of holding IV Lipids due  to acute illness in ICU  -Monitor CBGs on SSI  -Hold trace elements. Add Zinc 5 mg/day, Chromium 10 mcg/day and Selenium 60 mcg/day. May need to remove selenium/chromium if TPN continues/no RRT -Add MVI to TPN -Need PPI? Rx resident to discuss on rounds -F/u GOC, considering withdrawal of care soon per MD notes   Babs Bertin, PharmD, [facility] Clinical Pharmacist Pager [phone] 12/05/2015 8:04 AM

## 2015-12-05 NOTE — Progress Notes (Signed)
PULMONARY / CRITICAL CARE MEDICINE    Name: Matthew Costa MRN: [id number] DOB: 1966-07-01    ADMISSION DATE:  Dec 04, 2015  PRIMARY SERVICE: PCCM  CHIEF COMPLAINT:  Swelling, confusion, JAundice  BRIEF PATIENT DESCRIPTION: 50 y/o man with hx of EtOH abuse presenting with jaundice, swelling, and confusion. Hx of alcohol abuse, liver cirrhosis.  SIGNIFICANT EVENTS / STUDIES:  3/31- hypotensive.  4/1: intubated. Aspiration PNA on CXR. Had EGD: diffuse ulcerative changes t/o the esophagus and stomach. ? D/t ischemia vs toxic ingestion.   LINES / TUBES: PIV x3 R IJ TLC 4/1>>> OETT 4/1>>>  CULTURES: Blood x2 (3/31)>>> (-) Urine Cultures 3/31 >> NG final MRSA 4/1 (-) Blood cultures 4/1 >>  ANTIBIOTICS: Vanc 3/31 >> 4/1 Zosyn 3/31 >>  SUBJECTIVE:  Sedated pm vent . Overnight became hypoxic, also tachycardic. Vent setting adjusted, now on pressure support.  VITAL SIGNS: Temp:  [97 F (36.1 C)-98.2 F (36.8 C)] 97 F (36.1 C) (04/05 1330) Pulse Rate:  [36-118] 102 (04/05 1330) Resp:  [19-38] 35 (04/05 1330) BP: (70-100)/(42-71) 91/62 mmHg (04/05 1330) SpO2:  [87 %-100 %] 100 % (04/05 1330) FiO2 (%):  [100 %] 100 % (04/05 1200) Weight:  [278 lb 10.6 oz (126.4 kg)] 278 lb 10.6 oz (126.4 kg) (04/05 0500) HEMODYNAMICS: CVP:  [19 mmHg-22 mmHg] 19 mmHg VENTILATOR SETTINGS: Vent Mode:  [-] PRVC FiO2 (%):  [100 %] 100 % Set Rate:  [22 bmp-35 bmp] 35 bmp Vt Set:  [450 mL] 450 mL PEEP:  [18 cmH20] 18 cmH20 Plateau Pressure:  [29 cmH20-30 cmH20] 29 cmH20 INTAKE / OUTPUT: Intake/Output      04/04 0701 - 04/05 0700 04/05 0701 - 04/06 0700   I.V. (mL/kg) 2170.2 (17.2) 625.1 (4.9)   IV Piggyback 112.5 37.5   Total Intake(mL/kg) 2282.7 (18.1) 662.6 (5.2)   Urine (mL/kg/hr) 90 (0)    Total Output 90     Net +2192.7 +662.6         PHYSICAL EXAMINATION: General:  Sedated on vent, RASS- -4. Family- wife present at bedside. Neuro:  Sedated on vent . Not in distress.  HEENT:   Pupils equal, round, No blood around mouth and nose today. Orally intubated. Cardiovascular:  Normal, tachy ,no murmurs appreciated. Lungs: diffuse audible rhonchi without steth, on vent. Abdomen:  Obese, firm, no pain. + ascites. Distended. Musculoskeletal: Bilateral marked edema all extremities Skin:  Numerous angiomata on face, nose. + anasarca.   LABS:  CBC  Recent Labs Lab 12/03/15 1859 12/04/15 0350 12/05/15 0333  WBC 6.9 9.0 19.1*  HGB 8.6* 8.9* 8.6*  HCT 25.9* 26.9* 27.0*  PLT 79* 77* 77*   Coag's  Recent Labs Lab 12/02/15 0916 12/02/15 1649 12/04/15 0350  APTT 39*  --   --   INR 2.25* 1.75* 2.20*   BMET  Recent Labs Lab 12/03/15 0430 12/04/15 0350 12/05/15 0334  NA 133* 129* 130*  K 3.4* 3.5 4.4  CL 102 98* 99*  CO2 21* 19* 21*  BUN 39* 45* 51*  CREATININE 2.23* 2.39* 3.32*  GLUCOSE 92 128* 109*   Electrolytes  Recent Labs Lab 12/02/15 0916 12/03/15 0430 12/04/15 0350 12/05/15 0333 12/05/15 0334  CALCIUM 7.7* 7.9* 7.5*  --  7.8*  MG 2.2  --  1.8 2.0  --   PHOS 4.8* 4.3 5.0* 7.1*  --    Sepsis Markers  Recent Labs Lab 12/04/15 2029 12/19/2015 0128 12/02/15 0230  LATICACIDVEN >17.00* 16.79* 2.6*   ABG  Recent Labs Lab 12/16/2015  1746 12/02/15 0232 12/05/15 0539  PHART 7.371 7.438 7.122*  PCO2ART 40.5 36.2 65.3*  PO2ART 265.0* 172.0* 59.0*   Liver Enzymes  Recent Labs Lab 12/02/2015 0043  12/03/15 0430 12/04/15 0350 12/05/15 0334  AST 231*  --   --  209* 175*  ALT 58  --   --  72* 64*  ALKPHOS 85  --   --  57 52  BILITOT 7.2*  --   --  10.2* 11.2*  ALBUMIN 1.4*  < > 2.3* 1.9* 1.7*  < > = values in this interval not displayed. Cardiac Enzymes  Recent Labs Lab 11/29/15 1554  PROBNP 224*   Glucose  Recent Labs Lab 12/04/15 1216 12/04/15 1608 12/04/15 1901 12/04/15 2315 12/05/15 0608 12/05/15 1203  GLUCAP 95 92 88 95 110* 104*    Imaging Dg Chest Port 1 View  12/04/2015  CLINICAL DATA:  Acute ventilator  dependent respiratory failure. Followup airspace edema versus pneumonia. EXAM: PORTABLE CHEST 1 VIEW 1757 hr: COMPARISON:  Portable chest x-ray earlier today 0104 hr and earlier. FINDINGS: Endotracheal tube tip remains in satisfactory position approximately 4 cm above the carina. Right jugular dual-lumen central venous catheter tip projects over the cavoatrial junction or upper right atrium, unchanged. Interval worsening of the severe diffuse airspace opacities throughout both lungs, with air bronchograms involving the left lower lobe. Possible bilateral pleural effusions. IMPRESSION: 1. Support apparatus satisfactory. 2. Interval worsening of the severe diffuse bilateral airspace pulmonary edema versus pneumonia. Given the air bronchograms in the left lower lobe, pneumonia is favored here. Electronically Signed   By: Hulan Saas M.D.   On: 12/04/2015 18:08   Dg Chest Port 1 View  12/04/2015  CLINICAL DATA:  Hypoxia EXAM: PORTABLE CHEST 1 VIEW COMPARISON:  12/03/2015 FINDINGS: The endotracheal tube is 4.3 cm above the carina. Right jugular central line is unchanged, tip at the cavoatrial junction. Diffuse alveolar and interstitial opacities persist throughout both lungs without significant interval change. More dense confluent consolidation persists in the left base. No pneumothorax. IMPRESSION: Support equipment appears satisfactorily positioned. No significant interval change in the bilateral airspace opacities. Electronically Signed   By: Ellery Plunk M.D.   On: 12/04/2015 01:48    EKG: Sinus rhythm CXR: low lung volumes-->4/2 interval RLL airspace disease; likely reflects aspiration   ASSESSMENT / PLAN:  Active Problems:   Multisystem organ failure   MOSF (multiple organ systems failure)   Acute respiratory failure with hypoxia (HCC)   Elevated troponin   Acute liver failure   Acute renal failure with acute cortical necrosis (HCC)   Upper GI bleed   Anuria   Elevated liver enzymes    Abdominal distention  PULMONARY A: Ventilator dependence in setting of inability to protect airway now back on PRVC PEEP- 18, Fio2- 100% Aspiration PNA  Multi-organ failure- hepatic, renal, neuro. P:   Cont full vent support for now. Not ready for wean.  Cont PAD protocol  F/u PCXR in am  Improved sats- 100% Cont Zosyn Consider Palliative care, will talk to family.  CARDIOVASCULAR A: Circulatory shock. Initially was hypovolemia, but then likely element of cardiogenic in setting of acidosis w/ related cardiac suppression. Now likely sedation related, bleeding +/- sepsis CAD.  Demand ischemia Fluid overload- [place] P:   MAP goal > 65 CVP goal 8-12 Fluids as indicated On pressors> levophed and vasopressin.  Holding anticoag given active bleed. CArds following  RENAL A: Acute renal failure: volume depletion, ? hepatorenal, diuretics +/- NSAIDS-->creatinine w/out sig change  Metabolic acidosis + AG in setting of lactic acidosis -->ResolvedHyponatremia  Hypokalemia  Oliguric, positive 20L P:   CVP goal 8-12 Hold diuretics and antihypertensives Replace K  MAP goal >65 Liver is unable to metabolize lactate presently-->will d/c further lactate trends.  Will talk to nephrology.    GASTROINTESTINAL A: Acute liver failure. 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  UGIB. EGD: finding showed-->severe necrosis of the esophagus. Etiologies could includeischemic necrosis versus caustic ingestion. Gastric body with mild-to-moderate necrosis. No varices Necrotic Esophagus c/w ischemia vs toxic ingestion.  Ascites  P:   Cont PPI IV NPO Cont TPN. Cant insert NGT 2/2 necrotic esophagus.  No further endoscopic interventions as nothing else to offer. No surgical interventions given the diffuse nature of the findings.  Correct coags  Not liver transplant candidate with alcohol use. Avoid antiplt and anticoag GI recs appreciated, has Signed off.  HEMATOLOGIC A: Anemia  from GIB s/p x-fusion PRBCs. With stable Hgb at ~8. Coagulopathy in setting of liver dz  Thrombocytopenia  P:   F/u INR, goal <1.5-->transfuse as indicated  Serial cbc and coags Transfuse for hgb <7 OR for acute hgb changes in setting of acute bleeding   INFECTIOUS A: Sepsis.  ? SBP Aspiration PNA P:   Cont zosyn  ENDOCRINE A: No active issues P:   Trend glucose  BS q 4  NEUROLOGIC A: Hepatic encephalopathy  Azotemia Probably element of w/d from alcohol P:   Cont PAD protocol   Consideration to withdraw care soon, as prognosis is Grim. Pt is DNR. Wife updated- 4/5.  Inocente SallesEjiro Ericia Moxley,  PGY-3 IMTS. [phone].

## 2015-12-06 ENCOUNTER — Inpatient Hospital Stay (HOSPITAL_COMMUNITY): Payer: 59

## 2015-12-06 DIAGNOSIS — K2289 Other specified disease of esophagus: Secondary | ICD-10-CM

## 2015-12-06 DIAGNOSIS — K228 Other specified diseases of esophagus: Secondary | ICD-10-CM

## 2015-12-06 DIAGNOSIS — K7291 Hepatic failure, unspecified with coma: Secondary | ICD-10-CM

## 2015-12-06 LAB — COMPREHENSIVE METABOLIC PANEL
ALBUMIN: 1.4 g/dL — AB (ref 3.5–5.0)
ALK PHOS: 61 U/L (ref 38–126)
ALT: 56 U/L (ref 17–63)
AST: 139 U/L — AB (ref 15–41)
Anion gap: 11 (ref 5–15)
BILIRUBIN TOTAL: 13.1 mg/dL — AB (ref 0.3–1.2)
BUN: 61 mg/dL — AB (ref 6–20)
CO2: 19 mmol/L — ABNORMAL LOW (ref 22–32)
Calcium: 8 mg/dL — ABNORMAL LOW (ref 8.9–10.3)
Chloride: 97 mmol/L — ABNORMAL LOW (ref 101–111)
Creatinine, Ser: 3.88 mg/dL — ABNORMAL HIGH (ref 0.61–1.24)
GFR calc Af Amer: 20 mL/min — ABNORMAL LOW (ref >60)
GFR calc non Af Amer: 17 mL/min — ABNORMAL LOW (ref >60)
GLUCOSE: 118 mg/dL — AB (ref 65–99)
POTASSIUM: 4.3 mmol/L (ref 3.5–5.1)
Sodium: 127 mmol/L — ABNORMAL LOW (ref 135–145)
TOTAL PROTEIN: 4.5 g/dL — AB (ref 6.5–8.1)

## 2015-12-06 LAB — CULTURE, BLOOD (ROUTINE X 2)
CULTURE: NO GROWTH
CULTURE: NO GROWTH

## 2015-12-06 LAB — BLOOD GAS, ARTERIAL
ACID-BASE DEFICIT: 6.1 mmol/L — AB (ref 0.0–2.0)
BICARBONATE: 20.5 meq/L (ref 20.0–24.0)
Drawn by: 129711
FIO2: 100
LHR: 35 {breaths}/min
MECHVT: 0.45 mL
O2 Saturation: 97.5 %
PATIENT TEMPERATURE: 98.6
PEEP/CPAP: 18 cmH2O
PH ART: 7.213 — AB (ref 7.350–7.450)
PO2 ART: 113 mmHg — AB (ref 80.0–100.0)
TCO2: 22.1 mmol/L (ref 0–100)
pCO2 arterial: 52.7 mmHg — ABNORMAL HIGH (ref 35.0–45.0)

## 2015-12-06 LAB — PHOSPHORUS: Phosphorus: 6.1 mg/dL — ABNORMAL HIGH (ref 2.5–4.6)

## 2015-12-06 LAB — MAGNESIUM: Magnesium: 1.9 mg/dL (ref 1.7–2.4)

## 2015-12-06 LAB — GLUCOSE, CAPILLARY
GLUCOSE-CAPILLARY: 96 mg/dL (ref 65–99)
Glucose-Capillary: 107 mg/dL — ABNORMAL HIGH (ref 65–99)
Glucose-Capillary: 107 mg/dL — ABNORMAL HIGH (ref 65–99)

## 2015-12-06 MED ORDER — BUMETANIDE 0.25 MG/ML IJ SOLN
1.0000 mg | Freq: Once | INTRAMUSCULAR | Status: AC
  Administered 2015-12-06: 1 mg via INTRAVENOUS
  Filled 2015-12-06: qty 4

## 2015-12-06 MED ORDER — ZINC TRACE METAL 1 MG/ML IV SOLN
INTRAVENOUS | Status: AC
  Administered 2015-12-06: 18:00:00 via INTRAVENOUS
  Filled 2015-12-06: qty 1440

## 2015-12-06 MED ORDER — ZINC TRACE METAL 1 MG/ML IV SOLN
INTRAVENOUS | Status: DC
  Filled 2015-12-06: qty 1440

## 2015-12-06 NOTE — Progress Notes (Addendum)
PARENTERAL NUTRITION CONSULT NOTE - Follow Up  Pharmacy Consult for TPN Indication: Necrotic esophagus  No Known Allergies  Patient Measurements: Height: 6' (182.9 cm) Weight: 278 lb 10.6 oz (126.4 kg) IBW/kg (Calculated) : 77.6 Adjusted Body Weight: 90 kg Usual Weight:   Vital Signs: Temp: 96.8 F (36 C) (04/06 0715) BP: 96/62 mmHg (04/06 0715) Pulse Rate: 102 (04/06 0715) Intake/Output from previous day: 04/05 0701 - 04/06 0700 In: 2414.7 [I.V.:1983.7; IV Piggyback:131; TPN:300] Out: 330 [Urine:330] Intake/Output from this shift:    Labs:  Recent Labs  12/03/15 1859 12/04/15 0350 12/05/15 0333  WBC 6.9 9.0 19.1*  HGB 8.6* 8.9* 8.6*  HCT 25.9* 26.9* 27.0*  PLT 79* 77* 77*  INR  --  2.20*  --      Recent Labs  12/04/15 0350 12/05/15 0333 12/05/15 0334 12/06/15 0604  NA 129*  --  130* 127*  K 3.5  --  4.4 4.3  CL 98*  --  99* 97*  CO2 19*  --  21* 19*  GLUCOSE 128*  --  109* 118*  BUN 45*  --  51* 61*  CREATININE 2.39*  --  3.32* 3.88*  CALCIUM 7.5*  --  7.8* 8.0*  MG 1.8 2.0  --  1.9  PHOS 5.0* 7.1*  --  6.1*  PROT 4.8*  --  4.4* 4.5*  ALBUMIN 1.9*  --  1.7* 1.4*  AST 209*  --  175* 139*  ALT 72*  --  64* 56  ALKPHOS 57  --  52 61  BILITOT 10.2*  --  11.2* 13.1*  PREALBUMIN 3.0*  --   --   --   TRIG 97  --   --   --    Estimated Creatinine Clearance: 32.3 mL/min (by C-G formula based on Cr of 3.88).    Recent Labs  12/05/15 1840 12/06/15 0003 12/06/15 0553  GLUCAP 103* 107* 107*    Medical History: Past Medical History  Diagnosis Date  . Hypertension   . Hyperlipidemia   . Coronary artery disease   . Myocardial infarction (HCC)   . Depression     PT IS ALCOHOLIC- RECOVERING- COMPLETED 30 DAY FELLOWSHIP HALL REHAB APRIL 2014 - NO ALCOHOL SINCE  . Arthritis     SPINE  . HNP (herniated nucleus pulposus)     LUMBAR L5-S1- PAIN IS IN RT BUTTOCK AND DOWN RT LEG TO FEET- TINGLING IN FEET  . ETOH abuse     Insulin Requirements in  the past 24 hours:  0 units on SSI  Assessment: 50 y/o M alcoholic referred to ED by PCP for jaundice, abdominal distention, edema, intractable N/V (black, coffee ground). Pt in acute liver failure, ARF, elevated LA, hypoglycemia, UGIB with AMS. Elevated troponins.  PMH: h/o CAD 12/13, noncompliance, alcoholism  GI: GIB from esophageal necrosis. S/p EGD-esophageal ulcerations felt c/w ischemia or toxic ingestion. No NG or OG tubes d/t necrotic esophagus, no surgical intervention. IV Protonix infusion > PPI IV. Prealbumin 3. LBM 3/31  Endo: CBGs 103-118, no h/o DM noted. On SSI   Lytes: Na low 127, K 4.3, Phos 7.1>6.1, Mg 1.9, coCa~10.1 (Ca x Phos ~64)  Renal: ARF. Scr up again 2.39>3.88. UOP 330cc/24h. BUN 51>61. Vol overloaded, severe anasarca. Bumex x1 4/5  Pulm: VDRF. Worsening respiratory status on 4/5, still at fiO2 90, persistent hypoxia, severe acidosis  Cards: h/o CAD. New NSTEMI likely from demand ischemia. Norepi, VP drips  Hepatobil: Acute liver failure with thrombocytopenia, elevated INR.  Tbili up to 13.1, LFTs trend down - AST 139, ALT wnl. TG wnl  Neuro: alcohol abuse, on Fentanyl/Versed infusions, unresponsive. CPOT 0, GCS 8, RASS -3 (goal 0)  ID: Sepsis on Zosyn. AF, WBC up to 19.1, BC NGTD, UC neg.  Best Practices: SCDs, PPI IV  TPN Access: CVC Triple Lumen 12-08-15  TPN start date: 12/03/2015   Nutritional Goals:  Kcal: 1610-9604 Protein: 161 gm  Current Nutrition:  -Clinimix (no lytes) 5/15 at 60 mL/h - provides 1414 kcal + 100g protein -NPO   Plan:  - Per discussion with CCM and desire to decrease fluid volume patient is receiving, will decrease Clinimix (NO LYTES) 5/15 to 40 ml/h. This will provide 960 ml fluid + 682 Kcal + 48g of protein. Will be unable to meet protein goals due to Clinimix limitations   -D#4/7 of holding IV Lipids due to acute illness in ICU  -Add MVI to TPN -Hold trace elements. Add Zinc 5 mg/day, Chromium 10 mcg/day and Selenium  60 mcg/day. May need to remove selenium/chromium if no RRT/BUN continues to rise -Monitor CBGs on SSI  -F/u AM labs -F/u GOC   Babs Bertin, PharmD, [facility] Clinical Pharmacist Pager [phone] 12/06/2015 7:52 AM

## 2015-12-06 NOTE — Progress Notes (Signed)
Vent alarming and unable to advance inline suction. RT notified.

## 2015-12-06 NOTE — Progress Notes (Signed)
PULMONARY / CRITICAL CARE MEDICINE    Name: Matthew Costa MRN: [id number] DOB: 1965/11/18    ADMISSION DATE:  11/29/2015  PRIMARY SERVICE: PCCM  CHIEF COMPLAINT:  Swelling, confusion, JAundice  BRIEF PATIENT DESCRIPTION: 50 y/o man with hx of EtOH abuse presenting with jaundice, swelling, and confusion. Hx of alcohol abuse, liver cirrhosis. Now with liver failure, with increasing Bili, and worsening albumin. Also with renal failure.  SIGNIFICANT EVENTS / STUDIES:  3/31- hypotensive.  4/1: intubated. Aspiration PNA on CXR. Had EGD: diffuse ulcerative changes t/o the esophagus and stomach. ? D/t ischemia vs toxic ingestion.   LINES / TUBES: PIV x3 R IJ TLC 4/1>>> OETT 4/1>>>  CULTURES: Blood x2 (3/31)>>> (-) Urine Cultures 3/31 >> NG final MRSA 4/1 (-) Blood cultures 4/1 >>  ANTIBIOTICS: Vanc 3/31 >> 4/1 Zosyn 3/31 >>  SUBJECTIVE:  Sedated pm vent . No response to pain. No change overnight. When moving patient, he desats, and starts bleeding  VITAL SIGNS: Temp:  [96.6 F (35.9 C)-97.2 F (36.2 C)] 97.2 F (36.2 C) (04/06 1445) Pulse Rate:  [33-118] 107 (04/06 1445) Resp:  [20-38] 32 (04/06 1445) BP: (64-115)/(36-79) 92/59 mmHg (04/06 1445) SpO2:  [90 %-100 %] 98 % (04/06 1445) FiO2 (%):  [80 %-100 %] 80 % (04/06 1222) HEMODYNAMICS: CVP:  [14 mmHg] 14 mmHg VENTILATOR SETTINGS: Vent Mode:  [-] PRVC FiO2 (%):  [80 %-100 %] 80 % Set Rate:  [35 bmp] 35 bmp Vt Set:  [450 mL] 450 mL PEEP:  [14 cmH20-18 cmH20] 14 cmH20 Plateau Pressure:  [28 cmH20-37 cmH20] 34 cmH20 INTAKE / OUTPUT: Intake/Output      04/05 0701 - 04/06 0700 04/06 0701 - 04/07 0700   I.V. (mL/kg) 2068.9 (16.4) 596.4 (4.7)   IV Piggyback 143.5 50   TPN 840 420   Total Intake(mL/kg) 3052.4 (24.1) 1066.4 (8.4)   Urine (mL/kg/hr) 330 (0.1) 40 (0)   Total Output 330 40   Net +2722.4 +1026.4         PHYSICAL EXAMINATION: General:  Sedated on vent, RASS- -4.  Neuro:  Sedated on vent . Not in  distress, no response to pain HEENT:  Pupils equal, round, blood around mouth and nose today. Orally intubated. Cardiovascular:  Normal, tachy ,no murmurs appreciated. Lungs: Equal chest rise on vent. Abdomen:  Obese, firm, no tenderness, + ascites. Distended. Musculoskeletal: Bilateral marked edema all extremities Skin:  Numerous angiomata on face, nose. + anasarca.   LABS:  CBC  Recent Labs Lab 12/03/15 1859 12/04/15 0350 12/05/15 0333  WBC 6.9 9.0 19.1*  HGB 8.6* 8.9* 8.6*  HCT 25.9* 26.9* 27.0*  PLT 79* 77* 77*   Coag's  Recent Labs Lab 12/02/15 0916 12/02/15 1649 12/04/15 0350  APTT 39*  --   --   INR 2.25* 1.75* 2.20*   BMET  Recent Labs Lab 12/04/15 0350 12/05/15 0334 12/06/15 0604  NA 129* 130* 127*  K 3.5 4.4 4.3  CL 98* 99* 97*  CO2 19* 21* 19*  BUN 45* 51* 61*  CREATININE 2.39* 3.32* 3.88*  GLUCOSE 128* 109* 118*   Electrolytes  Recent Labs Lab 12/04/15 0350 12/05/15 0333 12/05/15 0334 12/06/15 0604  CALCIUM 7.5*  --  7.8* 8.0*  MG 1.8 2.0  --  1.9  PHOS 5.0* 7.1*  --  6.1*   Sepsis Markers  Recent Labs Lab 11/12/2015 2029 12/02/2015 0128 12/02/15 0230  LATICACIDVEN >17.00* 16.79* 2.6*   ABG  Recent Labs Lab 12/02/15 0232 12/05/15 0539 12/05/15  1445  PHART 7.438 7.122* 7.213*  PCO2ART 36.2 65.3* 52.7*  PO2ART 172.0* 59.0* 113*   Liver Enzymes  Recent Labs Lab 12/04/15 0350 12/05/15 0334 12/06/15 0604  AST 209* 175* 139*  ALT 72* 64* 56  ALKPHOS 57 52 61  BILITOT 10.2* 11.2* 13.1*  ALBUMIN 1.9* 1.7* 1.4*   Cardiac Enzymes  Recent Labs Lab 11/29/15 1554  PROBNP 224*   Glucose  Recent Labs Lab 12/05/15 0608 12/05/15 1203 12/05/15 1840 12/06/15 0003 12/06/15 0553 12/06/15 1150  GLUCAP 110* 104* 103* 107* 107* 96    Imaging Dg Chest Port 1 View  12/06/2015  CLINICAL DATA:  Respiratory failure. EXAM: PORTABLE CHEST 1 VIEW COMPARISON:  12/04/2015. FINDINGS: Endotracheal tube and right IJ line in stable  position. Heart size stable. Diffuse bilateral pulmonary infiltrates are again noted . Slight interim clearing from prior exam. IMPRESSION: 1. Lines and tubes stable position. 2. Persistent dense bilateral pulmonary infiltrates/pulmonary edema, slight clearing from prior exam . Electronically Signed   By: Maisie Fus  Register   On: 12/06/2015 08:17   Dg Chest Port 1 View  12/04/2015  CLINICAL DATA:  Acute ventilator dependent respiratory failure. Followup airspace edema versus pneumonia. EXAM: PORTABLE CHEST 1 VIEW 1757 hr: COMPARISON:  Portable chest x-ray earlier today 0104 hr and earlier. FINDINGS: Endotracheal tube tip remains in satisfactory position approximately 4 cm above the carina. Right jugular dual-lumen central venous catheter tip projects over the cavoatrial junction or upper right atrium, unchanged. Interval worsening of the severe diffuse airspace opacities throughout both lungs, with air bronchograms involving the left lower lobe. Possible bilateral pleural effusions. IMPRESSION: 1. Support apparatus satisfactory. 2. Interval worsening of the severe diffuse bilateral airspace pulmonary edema versus pneumonia. Given the air bronchograms in the left lower lobe, pneumonia is favored here. Electronically Signed   By: Hulan Saas M.D.   On: 12/04/2015 18:08    EKG: Sinus rhythm CXR: low lung volumes-->4/2 interval RLL airspace disease; likely reflects aspiration   ASSESSMENT / PLAN:  Principal Problem:   Liver failure (HCC) Active Problems:   Esophageal necrosis   Multisystem organ failure   MOSF (multiple organ systems failure)   Acute respiratory failure with hypoxia (HCC)   Elevated troponin   Acute liver failure   Acute renal failure with acute cortical necrosis (HCC)   Upper GI bleed   Anuria   Elevated liver enzymes   Abdominal distention  PULMONARY A: Ventilator dependence in setting of inability to protect airway now back on PRVC Aspiration PNA  Multi-organ  failure P:   Cont full vent support for now. Not ready for wean.  Cont PAD protocol  F/u PCXR in am  Cont Zosyn Palliative care consult  CARDIOVASCULAR A: Circulatory shock. Initially was hypovolemia, but then likely element of cardiogenic in setting of acidosis w/ related cardiac suppression. Now likely sedation related, bleeding +/- sepsis CAD.  Demand ischemia Fluid overload- [place] P:   MAP goal > 65 CVP goal 8-12 On pressors> levophed and vasopressin.  Holding anticoag given active bleed. CArds following  RENAL A: Acute renal failure: volume depletion, ? hepatorenal, diuretics +/- NSAIDS-->creatinine w/out sig change Metabolic acidosis + AG in setting of lactic acidosis -->Resolved Hyponatremia  Hypokalemia  Oliguric, positive 20L P:   CVP goal 8-12 Hold diuretics and antihypertensives Replace K  MAP goal >65 Liver is unable to metabolize lactate presently-->will d/c further lactate trends.  Will talk to nephrology.  1g Bumex given yesterday, another today, some Urine output   GASTROINTESTINAL  A: Acute liver failure. 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  UGIB. EGD: finding showed-->severe necrosis of the esophagus. Etiologies could includeischemic necrosis versus caustic ingestion. Gastric body with mild-to-moderate necrosis. No varices Necrotic Esophagus c/w ischemia vs toxic ingestion.  Ascites  Persistent Bleeding from mouth P:   Cont PPI IV NPO Cont TPN. Cant insert NGT 2/2 necrotic esophagus.  No further endoscopic interventions as nothing else to offer. No surgical interventions given the diffuse nature of the findings.  Not liver transplant candidate with alcohol use. Avoid antiplt and anticoag GI recs appreciated, has Signed off.  HEMATOLOGIC A: Anemia from GIB s/p x-fusion PRBCs. With stable Hgb at ~8. Coagulopathy in setting of liver dz  Thrombocytopenia  P:   F/u INR, goal Serial cbc and coags Transfuse for hgb <7 OR for  acute hgb changes in setting of acute bleeding   INFECTIOUS A: Sepsis.  ? SBP Aspiration PNA P:   Cont zosyn  ENDOCRINE A: No active issues P:   Trend glucose  BS q 4  NEUROLOGIC A: Hepatic encephalopathy  Azotemia Probably element of w/d from alcohol P:   Cont PAD protocol   Consideration to withdraw care soon, as prognosis is Grim. Pt is DNR. Wife updated- 4/5. PAlliative consult.  Inocente SallesEjiro Denissa Cozart,  PGY-3 IMTS. [phone].

## 2015-12-06 NOTE — Progress Notes (Signed)
    Subjective:  Intubated and sedated. Unresponsive. Wife present at bedside   Objective:  Filed Vitals:   12/06/15 0745 12/06/15 0800 12/06/15 0815 12/06/15 0830  BP: 94/57 101/58 98/66 97/63   Pulse: 34 35 64 103  Temp: 96.8 F (36 C) 96.8 F (36 C) 96.8 F (36 C) 96.6 F (35.9 C)  TempSrc:      Resp: 31 33 37 32  Height:      Weight:      SpO2: 98% 98% 99% 99%   Telemetry: sinus tachy. 4-5 beat runs of SVT vs VT.  Intake/Output from previous day:  Intake/Output Summary (Last 24 hours) at 12/06/15 0850 Last data filed at 12/06/15 0800  Gross per 24 hour  Intake 2499.88 ml  Output    350 ml  Net 2149.88 ml    Physical Exam: Physical exam: Well-developed well-nourished intubated and sedated Skin is warm and dry.  HEENT is normal.  Neck is supple.  Chest Coarse rhonchi Cardiovascular exam is regular rate and rhythm. No gallop or murmur. Abdominal exam distended and tense Extremities show 3-4+ edema/anasarca. neuro - intubated and sedated    Lab Results: Basic Metabolic Panel:  Recent Labs  21/30/8603/01/16 0333 12/05/15 0334 12/06/15 0604  NA  --  130* 127*  K  --  4.4 4.3  CL  --  99* 97*  CO2  --  21* 19*  GLUCOSE  --  109* 118*  BUN  --  51* 61*  CREATININE  --  3.32* 3.88*  CALCIUM  --  7.8* 8.0*  MG 2.0  --  1.9  PHOS 7.1*  --  6.1*   CBC:  Recent Labs  12/04/15 0350 12/05/15 0333  WBC 9.0 19.1*  HGB 8.9* 8.6*  HCT 26.9* 27.0*  MCV 100.4* 103.4*  PLT 77* 77*     Assessment/Plan:  1 non-ST elevation myocardial infarction-istat troponin 1.41 on admission. No other troponins checked. Patient's troponin is elevated likely secondary to demand ischemia. He is not a candidate for aggressive cardiac evaluation due to multisystem organ failure. We cannot add aspirin or heparin due to  GI bleed. We cannot add beta blockade or other medications because of hypotension and shock.  2 VDRF-management per CCM 3 Acute renal failure 4 Cirrhosis/acute  liver failure 5 Alcohol abuse 6 Volume overload-likely multifactorial including acute renal failure, cirrhosis with low albumin with possible cardiac contribution. Weight up 46 lbs. I/O positive 24/5 liters. Marked anasarca.  Apparently Echo never ordered. Still oliguric with diuretics.  7 GI bleed-secondary to esophageal necrosis/ulceration. Follow hemoglobin. S/p EGD-esophageal ulcerations felt c/w ischemia or toxic ingestion. 8 Coagulopathy 9 Sepsis Patient is still requiring high dose pressor support. On maximal vent support with persistent  acidosis.  Still hypotensive.  Prognosis looks very grim with multisystem failure.  Theron Aristaeter [email or web address],FACC 12/06/2015, 8:50 AM

## 2015-12-06 NOTE — Procedures (Signed)
Because of high PIP pt was bagged and lavaged with PEEP valve on ambu bag.  Mucus plugs obtained.

## 2015-12-07 ENCOUNTER — Inpatient Hospital Stay (HOSPITAL_COMMUNITY): Payer: 59

## 2015-12-07 ENCOUNTER — Ambulatory Visit: Payer: Self-pay | Admitting: Cardiovascular Disease

## 2015-12-07 DIAGNOSIS — Z029 Encounter for administrative examinations, unspecified: Secondary | ICD-10-CM

## 2015-12-07 DIAGNOSIS — Z515 Encounter for palliative care: Secondary | ICD-10-CM | POA: Insufficient documentation

## 2015-12-07 DIAGNOSIS — J9600 Acute respiratory failure, unspecified whether with hypoxia or hypercapnia: Secondary | ICD-10-CM | POA: Insufficient documentation

## 2015-12-07 LAB — COMPREHENSIVE METABOLIC PANEL
ALBUMIN: 1.3 g/dL — AB (ref 3.5–5.0)
ALT: 50 U/L (ref 17–63)
AST: 131 U/L — AB (ref 15–41)
Alkaline Phosphatase: 79 U/L (ref 38–126)
Anion gap: 12 (ref 5–15)
BUN: 69 mg/dL — AB (ref 6–20)
CHLORIDE: 95 mmol/L — AB (ref 101–111)
CO2: 17 mmol/L — ABNORMAL LOW (ref 22–32)
CREATININE: 4.32 mg/dL — AB (ref 0.61–1.24)
Calcium: 8 mg/dL — ABNORMAL LOW (ref 8.9–10.3)
GFR calc Af Amer: 17 mL/min — ABNORMAL LOW (ref >60)
GFR, EST NON AFRICAN AMERICAN: 15 mL/min — AB (ref >60)
GLUCOSE: 89 mg/dL (ref 65–99)
POTASSIUM: 4.5 mmol/L (ref 3.5–5.1)
Sodium: 124 mmol/L — ABNORMAL LOW (ref 135–145)
Total Bilirubin: 13.5 mg/dL — ABNORMAL HIGH (ref 0.3–1.2)
Total Protein: 4.7 g/dL — ABNORMAL LOW (ref 6.5–8.1)

## 2015-12-07 LAB — PHOSPHORUS: Phosphorus: 6.2 mg/dL — ABNORMAL HIGH (ref 2.5–4.6)

## 2015-12-07 LAB — CBC
HEMATOCRIT: 25.1 % — AB (ref 39.0–52.0)
HEMOGLOBIN: 8.5 g/dL — AB (ref 13.0–17.0)
MCH: 35.3 pg — ABNORMAL HIGH (ref 26.0–34.0)
MCHC: 33.9 g/dL (ref 30.0–36.0)
MCV: 104.1 fL — AB (ref 78.0–100.0)
Platelets: 47 10*3/uL — ABNORMAL LOW (ref 150–400)
RBC: 2.41 MIL/uL — AB (ref 4.22–5.81)
RDW: 22.6 % — AB (ref 11.5–15.5)
WBC: 28.8 10*3/uL — AB (ref 4.0–10.5)

## 2015-12-07 LAB — MAGNESIUM: MAGNESIUM: 1.9 mg/dL (ref 1.7–2.4)

## 2015-12-07 LAB — GLUCOSE, CAPILLARY
Glucose-Capillary: 74 mg/dL (ref 65–99)
Glucose-Capillary: 78 mg/dL (ref 65–99)
Glucose-Capillary: 81 mg/dL (ref 65–99)

## 2015-12-07 MED ORDER — SODIUM CHLORIDE 0.9 % IV SOLN
10.0000 mg/h | INTRAVENOUS | Status: DC
  Filled 2015-12-07: qty 10

## 2015-12-07 MED ORDER — MIDAZOLAM BOLUS VIA INFUSION (WITHDRAWAL LIFE SUSTAINING TX)
5.0000 mg | INTRAVENOUS | Status: DC | PRN
  Filled 2015-12-07: qty 20

## 2015-12-07 MED ORDER — SODIUM CHLORIDE 0.9 % IV SOLN
100.0000 ug/h | INTRAVENOUS | Status: DC
  Filled 2015-12-07: qty 50

## 2015-12-07 MED ORDER — FENTANYL BOLUS VIA INFUSION
50.0000 ug | INTRAVENOUS | Status: DC | PRN
  Filled 2015-12-07: qty 200

## 2015-12-10 ENCOUNTER — Telehealth: Payer: Self-pay

## 2015-12-10 NOTE — Telephone Encounter (Signed)
On 12/10/2015 [facility] The death certificate is for cremation. The patient is a patient of Doctor Dios. The death certificate will be taken to [facility] this am for signature. On 12/10/2015 I received the death certificate back from Doctor Dios. I got the death certificate ready and called the funeral home to let them know I faxed the death certificate over to the funeral home per their request.

## 2015-12-10 NOTE — Telephone Encounter (Signed)
On 12/10/2015 I received a [facility] The death certificate is for cremation. The patient is a patient of Doctor Dios. The death certificate will be taken to Pulmonary Unit at [facility] tomorrow am for signature. On 12/11/2015 I received the death certificate back from Doctor Dios. I got the death certificate ready and called the funeral home to let them know the death certificate is ready for pickup.

## 2015-12-31 NOTE — Progress Notes (Signed)
PARENTERAL NUTRITION CONSULT NOTE - Follow Up  Pharmacy Consult for TPN Indication: Necrotic esophagus  No Known Allergies  Patient Measurements: Height: 6' (182.9 cm) Weight: 282 lb 6.6 oz (128.1 kg) IBW/kg (Calculated) : 77.6 Adjusted Body Weight: 90 kg Usual Weight:   Vital Signs: Temp: 96.4 F (35.8 C) (04/07 0810) Temp Source: Core (Comment) (04/07 0800) BP: 92/65 mmHg (04/07 0800) Pulse Rate: 103 (04/07 0810) Intake/Output from previous day: 04/06 0701 - 04/07 0700 In: 2189 [I.V.:1407; IV Piggyback:50; TPN:732] Out: 50 [Urine:50] Intake/Output from this shift: Total I/O In: 1484.6 [I.V.:1004.6; TPN:480] Out: 5 [Urine:5]  Labs:  Recent Labs  12/05/15 0333 21-Sep-2015 0626  WBC 19.1* PENDING  HGB 8.6* 8.5*  HCT 27.0* 25.1*  PLT 77* PENDING     Recent Labs  12/05/15 0333 12/05/15 0334 12/06/15 0604 21-Sep-2015 0420  NA  --  130* 127* 124*  K  --  4.4 4.3 4.5  CL  --  99* 97* 95*  CO2  --  21* 19* 17*  GLUCOSE  --  109* 118* 89  BUN  --  51* 61* 69*  CREATININE  --  3.32* 3.88* 4.32*  CALCIUM  --  7.8* 8.0* 8.0*  MG 2.0  --  1.9 1.9  PHOS 7.1*  --  6.1* 6.2*  PROT  --  4.4* 4.5* 4.7*  ALBUMIN  --  1.7* 1.4* 1.3*  AST  --  175* 139* 131*  ALT  --  64* 56 50  ALKPHOS  --  52 61 79  BILITOT  --  11.2* 13.1* 13.5*   Estimated Creatinine Clearance: 29.2 mL/min (by C-G formula based on Cr of 4.32).    Recent Labs  12/06/15 1150 12/06/15 2345 21-Sep-2015 0619  GLUCAP 96 74 81    Medical History: Past Medical History  Diagnosis Date  . Hypertension   . Hyperlipidemia   . Coronary artery disease   . Myocardial infarction (HCC)   . Depression     PT IS ALCOHOLIC- RECOVERING- COMPLETED 30 DAY FELLOWSHIP HALL REHAB APRIL 2014 - NO ALCOHOL SINCE  . Arthritis     SPINE  . HNP (herniated nucleus pulposus)     LUMBAR L5-S1- PAIN IS IN RT BUTTOCK AND DOWN RT LEG TO FEET- TINGLING IN FEET  . ETOH abuse     Insulin Requirements in the past 24 hours:   0 units on q6h SSI moderate  Assessment: 50 y/o M alcoholic referred to ED by PCP for jaundice, abdominal distention, edema, intractable N/V (black, coffee ground). Pt in acute liver failure, ARF, elevated LA, hypoglycemia, UGIB with AMS. Elevated troponins.  PMH: h/o CAD 12/13, noncompliance, alcoholism  GI: GIB from esophageal necrosis. S/p EGD-esophageal ulcerations felt c/w ischemia or toxic ingestion. No NG or OG tubes d/t necrotic esophagus, no surgical intervention - GI signed off. IV Protonix infusion > PPI IV. Prealbumin 3. LBM 3/31  Endo: CBGs 74-118, no h/o DM noted. On SSI   Lytes: Na 127>124, K 4.5, Phos 6.2, Mg 1.9, coCa~10.2 (Ca x Phos ~63)  Renal: ARF. Scr up again 3.88>4.32 (0.94 on 3/30). UOP only 50cc/24h. BUN up to 69. Vol overloaded, severe anasarca, wt up 44 lb since admit. Bumex x1 4/5, 4/6. NS kvo'd  Pulm: VDRF. Worsening respiratory status on 4/5, still at fiO2 90, persistent hypoxia, severe acidosis  Cards: h/o CAD. New NSTEMI likely from demand ischemia. Persistent bleed from mouth - no AC/antiplt. Norepi, VP drips  Hepatobil: Acute liver failure with thrombocytopenia, elevated INR.  Tbili up 13.5, LFTs trend down - AST 131, ALT wnl. TG wnl  Neuro: alcohol abuse, on Fentanyl/Versed infusions, unresponsive. CPOT 0, GCS 6, RASS -4 (goal -3)  ID: Intra-abdominal infxn?/sepsis on Zosyn D#7. AF, WBC up 19.1, BC Neg, UC neg.  Best Practices: SCDs, PPI IV  TPN Access: CVC Triple Lumen 12/29/2015  TPN start date: 12/03/2015   Nutritional Goals:  Kcal: 1610-9604 Protein: 161 gm  Current Nutrition:  -Clinimix (no lytes) 5/15 at 40 mL/h - Provides 682 kcal + 48 g protein. Reduced rate per discussion w/ CCM to decrease volume pt receiving (960 ml). -NPO   Plan:  -Withdrawing care today. TPN consult d/c'd   Babs Bertin, PharmD, [facility] Clinical Pharmacist Pager [phone] Jan 06, 2016 9:46 AM

## 2015-12-31 NOTE — Progress Notes (Signed)
PULMONARY / CRITICAL CARE MEDICINE    Name: Matthew Costa MRN: [id number] DOB: 03-09-1966    ADMISSION DATE:  December 18, 2015  PRIMARY SERVICE: PCCM  CHIEF COMPLAINT:  Swelling, confusion, JAundice  BRIEF PATIENT DESCRIPTION: 50 y/o man with hx of EtOH abuse presenting with jaundice, swelling, and confusion. Hx of alcohol abuse, liver cirrhosis. Now with liver failure, with increasing Bili, and worsening albumin. Also with renal failure.  SIGNIFICANT EVENTS / STUDIES:  3/31- hypotensive.  4/1: intubated. Aspiration PNA on CXR. Had EGD: diffuse ulcerative changes t/o the esophagus and stomach. ? D/t ischemia vs toxic ingestion.   LINES / TUBES: PIV x3 R IJ TLC 4/1>>> OETT 4/1>>>  CULTURES: Blood x2 (3/31)>>> (-) Urine Cultures 3/31 >> NG final MRSA 4/1 (-) Blood cultures 4/1 >>  ANTIBIOTICS: Vanc 3/31 >> 4/1 Zosyn 3/31 >>4/7  SUBJECTIVE:  Sedated pm vent . No response to pain. No change overnight.   VITAL SIGNS: Temp:  [96.1 F (35.6 C)-97.2 F (36.2 C)] 96.6 F (35.9 C) (04/07 0900) Pulse Rate:  [33-118] 103 (04/07 0900) Resp:  [14-39] 35 (04/07 0900) BP: (75-101)/(41-74) 91/58 mmHg (04/07 0900) SpO2:  [87 %-100 %] 100 % (04/07 1149) FiO2 (%):  [70 %-80 %] 70 % (04/07 1149) Weight:  [282 lb 6.6 oz (128.1 kg)] 282 lb 6.6 oz (128.1 kg) (04/07 0500) HEMODYNAMICS:   VENTILATOR SETTINGS: Vent Mode:  [-] PRVC FiO2 (%):  [70 %-80 %] 70 % Set Rate:  [35 bmp] 35 bmp Vt Set:  [450 mL] 450 mL PEEP:  [12 cmH20-14 cmH20] 14 cmH20 Plateau Pressure:  [23 cmH20-34 cmH20] 24 cmH20 INTAKE / OUTPUT: Intake/Output      04/06 0701 - 04/07 0700 04/07 0701 - 04/08 0700   I.V. (mL/kg) 1407 (11) 1080.8 (8.4)   IV Piggyback 50    TPN 732 520   Total Intake(mL/kg) 2189 (17.1) 1600.8 (12.5)   Urine (mL/kg/hr) 50 (0) 5 (0)   Total Output 50 5   Net +2139 +1595.8         PHYSICAL EXAMINATION: General:  Sedated on vent, RASS- -4.  Neuro:  Sedated on vent . Not in distress, no  response to pain HEENT:  Icteric, Pupils equal, round, blood around mouth and nose today. Orally intubated. Cardiovascular:  Normal, tachy ,no murmurs appreciated. Lungs: Equal chest rise on vent. Abdomen:  Obese, firm, no tenderness, + ascites. Distended. Musculoskeletal: Bilateral marked edema all extremities, weeping fluid form skin Skin:  Numerous angiomata on face, nose. + anasarca.   LABS:  CBC  Recent Labs Lab 12/04/15 0350 12/05/15 0333 12/25/2015 0626  WBC 9.0 19.1* 28.8*  HGB 8.9* 8.6* 8.5*  HCT 26.9* 27.0* 25.1*  PLT 77* 77* 47*   Coag's  Recent Labs Lab 12/02/15 0916 12/02/15 1649 12/04/15 0350  APTT 39*  --   --   INR 2.25* 1.75* 2.20*   BMET  Recent Labs Lab 12/05/15 0334 12/06/15 0604 12/20/2015 0420  NA 130* 127* 124*  K 4.4 4.3 4.5  CL 99* 97* 95*  CO2 21* 19* 17*  BUN 51* 61* 69*  CREATININE 3.32* 3.88* 4.32*  GLUCOSE 109* 118* 89   Electrolytes  Recent Labs Lab 12/05/15 0333 12/05/15 0334 12/06/15 0604 12/12/2015 0420  CALCIUM  --  7.8* 8.0* 8.0*  MG 2.0  --  1.9 1.9  PHOS 7.1*  --  6.1* 6.2*   Sepsis Markers  Recent Labs Lab 12-18-15 2029 12/30/2015 0128 12/02/15 0230  LATICACIDVEN >17.00* 16.79* 2.6*  ABG  Recent Labs Lab 12/02/15 0232 12/05/15 0539 12/05/15 1445  PHART 7.438 7.122* 7.213*  PCO2ART 36.2 65.3* 52.7*  PO2ART 172.0* 59.0* 113*   Liver Enzymes  Recent Labs Lab 12/05/15 0334 12/06/15 0604 12/22/2015 0420  AST 175* 139* 131*  ALT 64* 56 50  ALKPHOS 52 61 79  BILITOT 11.2* 13.1* 13.5*  ALBUMIN 1.7* 1.4* 1.3*   Glucose  Recent Labs Lab 12/05/15 1840 12/06/15 0003 12/06/15 0553 12/06/15 1150 12/06/15 2345 12/06/2015 0619  GLUCAP 103* 107* 107* 96 74 81    Imaging Dg Chest Port 1 View  12/29/2015  CLINICAL DATA:  Respiratory failure. EXAM: PORTABLE CHEST 1 VIEW COMPARISON:  12/06/2015. FINDINGS: Endotracheal tube, right IJ line in stable position. Heart size stable. Diffuse bilateral pulmonary  infiltrates again noted. Basilar atelectasis No interim change. Small left pleural effusion . No pneumothorax. IMPRESSION: 1. Lines and tubes in stable position. 2. Diffuse bilateral pulmonary infiltrates without interim change. Bibasilar atelectasis. Small left pleural effusion Electronically Signed   By: Maisie Fus  Register   On: 12/13/2015 07:14   Dg Chest Port 1 View  12/06/2015  CLINICAL DATA:  Respiratory failure. EXAM: PORTABLE CHEST 1 VIEW COMPARISON:  12/04/2015. FINDINGS: Endotracheal tube and right IJ line in stable position. Heart size stable. Diffuse bilateral pulmonary infiltrates are again noted . Slight interim clearing from prior exam. IMPRESSION: 1. Lines and tubes stable position. 2. Persistent dense bilateral pulmonary infiltrates/pulmonary edema, slight clearing from prior exam . Electronically Signed   By: Maisie Fus  Register   On: 12/06/2015 08:17   EKG: Sinus rhythm  CXR: low lung volumes-->4/2 interval RLL airspace disease; likely reflects aspiration   ASSESSMENT / PLAN:  Principal Problem:   Liver failure (HCC) Active Problems:   Esophageal necrosis   Multisystem organ failure   MOSF (multiple organ systems failure)   Acute respiratory failure with hypoxia (HCC)   Elevated troponin   Acute liver failure   Acute renal failure with acute cortical necrosis (HCC)   Upper GI bleed   Anuria   Elevated liver enzymes   Abdominal distention  PULMONARY A: Ventilator dependence in setting of inability to protect airway now back on PRVC Aspiration PNA  Multi-organ failure P:   Cont full vent support for now.  D/c antibiotics Palliative care consult, plan to withdraw care today  CARDIOVASCULAR A: Circulatory shock.  CAD.  Demand ischemia Fluid overload- [place] P:   On pressors> levophed and vasopressin.  Holding anticoag given active bleed. CArds signed off  RENAL A: Acute renal failure: volume depletion, ? hepatorenal, diuretics +/- NSAIDS-->creatinine w/out  sig change Metabolic acidosis + AG in setting of lactic acidosis -->Resolved Hyponatremia  Hypokalemia  Oliguric, positive 20L P:   CVP goal 8-12 Hold diuretics and antihypertensives Replace K  MAP goal >65   GASTROINTESTINAL A: Acute liver failure. 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  Necrotic Esophagus c/w ischemia vs toxic ingestion.  Ascites  Persistent Bleeding from mouth P:   NPO Cont TPN. Cant insert NGT 2/2 necrotic esophagus.  No further endoscopic interventions as nothing else to offer. No surgical interventions given the diffuse nature of the findings.  Not liver transplant candidate with alcohol use. Avoid antiplt and anticoag GI recs appreciated, has Signed off.  HEMATOLOGIC A: Anemia from GIB s/p x-fusion PRBCs. With stable Hgb at ~8. Coagulopathy in setting of liver dz  Thrombocytopenia  P:   Hgb Stable  INFECTIOUS A: Sepsis.  ? SBP Aspiration PNA P:   D/c  zosyn  ENDOCRINE A: No active issues P:   Trend glucose  BS q 4  NEUROLOGIC A: Hepatic encephalopathy  Azotemia Probably element of w/d from alcohol P:    Consideration to withdraw care today. Pt is DNR. Wife updated- 4/5. PAlliative consult.  Inocente SallesEjiro Coner Gibbard,  PGY-3 IMTS. [phone].

## 2015-12-31 NOTE — Consult Note (Signed)
Consultation Note Date: 12-25-15   Patient Name: Matthew Costa  DOB: [birth year]  MRN: [id number]  Age / Sex: 50 y.o., male  PCP: Timmothy Euler, MD Referring Physician: Raylene Miyamoto, MD  Reason for Consultation: Establishing goals of care, Psychosocial/spiritual support, Terminal Care and Withdrawal of life-sustaining treatment    Clinical Assessment/Narrative: Patient's a 50 year old male admitted with confusion, jaundice, edema and was coughing up blood. I met with his wife this morning who shares starting back in January she began to see changes in patient where he had increased abdominal girth, a dry nonproductive cough that would keep him awake at night. Patient has a history of alcoholism and did go for treatment in 2014 and remained sober for 6 months. He has been drinking since that time. She is unsure as to the amount. She describes him as being very guarded about his healthcare, not wanting to go to the doctors, not allowing her to go with him to the appointments. Patient has been very clear about his wishes. Spouse found an advanced directive in his desk during this hospitalization where he stated he would not want any life extending measures such as ventilation and artificial feeding. She is very tearful. They have 2 small children ages 1 and 75. His mother is also still alive and understandably struggling with what appears to be end-of-life. Despite antibiotics ventilator support he's been worsening over the past 24-48 hours he is requiring more O2 support via the ventilator pressors, and sedation. He is no longer making any urine, and his creatinine is 4.32. I did speak candidly to his wife that if things continue to go the way they are now I would anticipate a prognosis of just hours to days. Given patient's advanced directives that he would not want to remain on life support family trying to come to  decisions on extubation and proceeding with comfort care.  Had a lengthy family mtg with pt's mother, brother, wife, aunt and other extended family members as well as pt's pastor. Reviewed pathophysiology and what has led him to this place. Family tearful, struggling; feels very sudden to many family members. They acknowledge that they know he is dying and all are in agreement with honoring his wishes for no aggressive measures, or continued life support.  Contacts/Participants in Discussion: Primary Decision Maker: Jeannine Perreult Relationship to Patient spouse HCPOA: yes    SUMMARY OF RECOMMENDATIONS Wife has gone home to get the children today. She wishes for them to be able to tell their father good-bye Pt likely will die very quickly with any titration of pressors much less extubation and family aware Plan is to begin terminal extubation after the children are able to see their father Extubation process explained High probability of acute bleeding with extubation. Discussed with RN at bedside means to manage to not further distress family  Code Status/Advance Care Planning: DNR    Code Status Orders        Start     Ordered   12/02/15 1030  Do not attempt resuscitation (DNR)   Continuous     12/02/15 1029    Code Status History    Date Active Date Inactive Code Status Order ID Comments User Context   12/22/2015 12:01 AM 12/02/2015 10:29 AM Full Code [id number]  Luz Brazen, MD ED   08/17/2013 10:59 AM 08/17/2013  7:49 PM Full Code [id number]  Johnn Hai, MD Inpatient   12/23/2012  8:42 PM 01/04/2013  6:08 PM Full Code [id number]  Theodis Blaze, MD ED      Other Directives:None  Symptom Management:   Pain: No current nonverbal signs and symptoms of pain. Continue fentanyl drip at 50 g an hour , monitor and titrate for effect  Agitation: Patient is currently on Versed drip at 3 mg an hour continuous this rate. Likely will need an increase in both fentanyl and Versed at  time of extubation; will continue to monitor  Palliative Prophylaxis:   Delirium Protocol, Frequent Pain Assessment and Turn Reposition  Additional Recommendations (Limitations, Scope, Preferences):  No Artificial Feeding, No Chemotherapy, No Hemodialysis, No Surgical Procedures and No Tracheostomy    Psycho-social/Spiritual:  Support System: Strong Desire for further Chaplaincy support:no Additional Recommendations: Grief/Bereavement Support and Kidspath Referral  Prognosis: Hours - Days  Discharge Planning: Anticipate hospital death   Chief Complaint/ Primary Diagnoses: Present on Admission:  . Multisystem organ failure  I have reviewed the medical record, interviewed the patient and family, and examined the patient. The following aspects are pertinent.  Past Medical History  Diagnosis Date  . Hypertension   . Hyperlipidemia   . Coronary artery disease   . Myocardial infarction ([place])   . Depression     PT IS ALCOHOLIC- RECOVERING- COMPLETED 30 DAY FELLOWSHIP HALL REHAB APRIL 2014 - NO ALCOHOL SINCE  . Arthritis     SPINE  . HNP (herniated nucleus pulposus)     LUMBAR L5-S1- PAIN IS IN RT BUTTOCK AND DOWN RT LEG TO FEET- TINGLING IN FEET  . ETOH abuse    Social History   Social History  . Marital Status: Married    Spouse Name: N/A  . Number of Children: N/A  . Years of Education: N/A   Social History Main Topics  . Smoking status: Former Smoker -- 1.00 packs/day for 15 years    Types: Cigarettes  . Smokeless tobacco: Current User    Types: Snuff     Comment: dips 1-2 cans per week  . Alcohol Use: Yes     Comment: 2 glasses of wine and 3 cans of beer per day  . Drug Use: No  . Sexual Activity: No   Other Topics Concern  . None   Social History Narrative   Family History  Problem Relation Age of Onset  . Healthy Mother   . Early death Father   . Heart disease Father    Scheduled Meds: . antiseptic oral rinse  7 mL Mouth Rinse QID  .  chlorhexidine gluconate (SAGE KIT)  15 mL Mouth Rinse BID  . insulin aspart  0-15 Units Subcutaneous Q6H  . pantoprazole (PROTONIX) IV  40 mg Intravenous Q24H   Continuous Infusions: . sodium chloride 10 mL/hr at 12-13-2015 1300  . fentaNYL infusion INTRAVENOUS 50 mcg/hr (2015/12/13 1300)  . midazolam (VERSED) infusion 3 mg/hr (2015/12/13 1300)  . norepinephrine (LEVOPHED) Adult infusion 50 mcg/min (12/13/15 1300)  . TPN (CLINIMIX) Adult without lytes 40 mL/hr at 2015/12/13 1300  . vasopressin (PITRESSIN) infusion - *FOR SHOCK* 0.03 Units/min (12-13-2015 1300)   PRN Meds:.sodium chloride, midazolam, midazolam, phenol Medications Prior to Admission:  Prior to Admission medications   Medication Sig Start Date End Date Taking? Authorizing Provider  dextromethorphan (DELSYM) 30 MG/5ML liquid Take 60 mg by mouth as needed for cough.   Yes Historical Provider, MD  furosemide (LASIX) 40 MG tablet Take 1 tablet (40 mg total) by mouth 2 (two) times daily. 11/29/15  Yes Timmothy Euler, MD  ibuprofen (ADVIL,MOTRIN) 200 MG tablet Take  400 mg by mouth every 6 (six) hours as needed.   Yes Historical Provider, MD  metoprolol succinate (TOPROL-XL) 25 MG 24 hr tablet Take 1 tablet (25 mg total) by mouth daily. 11/29/15   Timmothy Euler, MD   No Known Allergies  Review of Systems  Unable to perform ROS: Acuity of condition    Physical Exam  Nursing note and vitals reviewed. Constitutional: He appears well-developed.  HENT:  Head: Normocephalic and atraumatic.  Respiratory:  Ventilated with high 02 support required. Not breathing above the vent  Genitourinary:  foley  Neurological:  Unresponsive On versed and fentanyl gtt  Skin: Skin is warm.    Vital Signs: BP 85/55 mmHg  Pulse 105  Temp(Src) 97 F (36.1 C) (Core (Comment))  Resp 21  Ht 6' (1.829 m)  Wt 128.1 kg (282 lb 6.6 oz)  BMI 38.29 kg/m2  SpO2 100%  SpO2: SpO2: 100 % O2 Device:SpO2: 100 % O2 Flow Rate: .   IO: Intake/output  summary:  Intake/Output Summary (Last 24 hours) at 12-12-2015 1314 Last data filed at 2015-12-12 1300  Gross per 24 hour  Intake 3383.46 ml  Output     40 ml  Net 3343.46 ml    LBM: Last BM Date: 11/18/2015 Baseline Weight: Weight: 105.235 kg (232 lb) Most recent weight: Weight: 128.1 kg (282 lb 6.6 oz)      Palliative Assessment/Data:  Flowsheet Rows        Most Recent Value   Intake Tab    Referral Department  Hospitalist   Unit at Time of Referral  ICU   Palliative Care Primary Diagnosis  Sepsis/Infectious Disease   Date Notified  12/06/15   Palliative Care Type  New Palliative care   Reason for referral  Clarify Goals of Care   Date of Admission  11/02/2015   Date first seen by Palliative Care  12/12/15   # of days Palliative referral response time  1 Day(s)   # of days IP prior to Palliative referral  6   Clinical Assessment    Palliative Performance Scale Score  10%   Pain Max last 24 hours  Not able to report   Pain Min Last 24 hours  Not able to report   Dyspnea Max Last 24 Hours  Not able to report   Dyspnea Min Last 24 hours  Not able to report   Nausea Max Last 24 Hours  Not able to report   Nausea Min Last 24 Hours  Not able to report   Anxiety Max Last 24 Hours  Not able to report   Anxiety Min Last 24 Hours  Not able to report   Other Max Last 24 Hours  Not able to report   Psychosocial & Spiritual Assessment    Palliative Care Outcomes    Palliative Care Outcomes  Clarified goals of care   Patient/Family wishes: Interventions discontinued/not started   Mechanical Ventilation, BiPAP, Trach, Hemodialysis, PEG, Transfusion   Palliative Care follow-up planned  Yes, Facility      Additional Data Reviewed:  CBC:    Component Value Date/Time   WBC 28.8* 12-12-2015 0626   WBC 11.6* 11/29/2015 1554   HGB 8.5* 12-12-15 0626   HCT 25.1* 12/12/15 0626   HCT 39.8 11/29/2015 1554   PLT 47* Dec 12, 2015 0626   PLT 155 11/29/2015 1554   MCV 104.1* 12/12/2015 0626    MCV 112* 11/29/2015 1554   NEUTROABS 9.6* 11/29/2015 1554   NEUTROABS 3.5 12/23/2012  1900   LYMPHSABS 1.0 11/29/2015 1554   LYMPHSABS 0.6* 12/23/2012 1900   MONOABS 0.8 12/23/2012 1900   EOSABS 0.0 11/29/2015 1554   EOSABS 0.0 12/23/2012 1900   BASOSABS 0.0 11/29/2015 1554   BASOSABS 0.1 12/23/2012 1900   Comprehensive Metabolic Panel:    Component Value Date/Time   NA 124* 12-10-15 0420   NA 127* 11/29/2015 1554   K 4.5 2015-12-10 0420   CL 95* 2015/12/10 0420   CO2 17* 12/10/15 0420   BUN 69* 10-Dec-2015 0420   BUN 15 11/29/2015 1554   CREATININE 4.32* December 10, 2015 0420   GLUCOSE 89 2015-12-10 0420   GLUCOSE 116* 11/29/2015 1554   CALCIUM 8.0* Dec 10, 2015 0420   AST 131* 2015/12/10 0420   ALT 50 12-10-15 0420   ALKPHOS 79 2015/12/10 0420   BILITOT 13.5* December 10, 2015 0420   BILITOT 4.4* 11/29/2015 1554   BILITOT 4.1* 11/29/2015 1554   PROT 4.7* 12-10-15 0420   PROT 6.0 11/29/2015 1554   PROT 6.0 11/29/2015 1554   ALBUMIN 1.3* 12-10-2015 0420   ALBUMIN 2.5* 11/29/2015 1554   ALBUMIN 2.6* 11/29/2015 1554     Time In: 0900 Time Out: 1030 Time Total: 90 min Greater than 50%  of this time was spent counseling and coordinating care related to the above assessment and plan.  Signed by: Dory Horn, NP  Dory Horn, NP  12-10-15, 1:14 PM  Please contact Palliative Medicine Team phone at [phone] for questions and concerns.

## 2015-12-31 NOTE — Progress Notes (Addendum)
Pt lines and drains were removed. 10 of versed and 125 of fent was waste with Porfirio OarSam Hogue, RN.

## 2015-12-31 NOTE — Discharge Summary (Signed)
                                PULMONARY/CRITICAL CARE DEATH SUMMARY   Name: Matthew BrownieChristopher E Segreto MRN: [id number] DOB: August 15, 1966 50 y.o.  Date of Admission: 2016-03-23  8:07 PM Date of Discharge: 12/01/2015 Attending Physician: No att. providers found  Discharge Diagnosis: Principal Problem:   Liver failure (HCC) Active Problems:   Esophageal necrosis   Multisystem organ failure   MOSF (multiple organ systems failure)   Acute respiratory failure with hypoxia (HCC)   Elevated troponin   Acute liver failure   Acute renal failure with acute cortical necrosis (HCC)   Upper GI bleed   Anuria   Elevated liver enzymes   Abdominal distention   Acute respiratory failure (HCC)   Palliative care encounter  Cause of death: Acute Respiratory Failure  Time of death: 18.53pm  Disposition and follow-up:   Mr.Lenton E Kelby FamManuel [facility].    Hospital Course: 50 y/o man with hx of EtOH abuse presented with jaundice, swelling, and confusion. Hx of HTN, CAD and liver cirrhosis thought to be due to alcohol abuse. Pt was intubated due to acute respiratory distress thought to be due to HCAP Vs aspiration PNA, with Shock- likely due to sepsis. Patient also had acute liver failure with prior liver cirrhosis, with worsening bilirubin- up to 13, and coagulopathy . Pt started having hematemesis on admission, with EGD showing severe diffuse ulceration, with hemorrhage and sloughing off of the esophagus, consistent with caustic ingestion Vs ischemic necrosis, with GI recommendation that nothing more could be offered. Pt also developed Acute Kidney injury, with progressively worsening oliguria, and acidosis. Trial of diuretics- Bumex without significant improvement in Oliguria, and with worsening renal function .Lab work also revealed an elevated troponin, cardiology was consulted, thought to be demand ischemia, and deemed patient was not a candidate  for intervention, and not a candidate for medical therapy with beta blockers or antiplatelets as patient had ongoing Gi bleed and septic shock requiring vasopressors for cardiovascular support. With worsening renal function, progressive fluid overload, persistent Gi bleed and liver failure, worsening respiratory status, family was updated about patients status and with the recommendation that care be withdrawn. Palliative care was consulted, and provided support for family. Pt expired 18.53pm, 12/05/2015. Family was present at bedside.  Signed: Onnie BoerEjiroghene E Idrissa Beville, MD 12/11/2015, 2:59 PM

## 2015-12-31 NOTE — Progress Notes (Signed)
    Subjective:  Intubated and sedated. Unresponsive. Wife present at bedside   Objective:  Filed Vitals:   12/09/2015 0600 12/23/2015 0700 12/02/2015 0800 12/15/2015 0810  BP: 92/57 88/56 92/65    Pulse: 106 103 102 103  Temp: 96.6 F (35.9 C) 96.4 F (35.8 C) 96.4 F (35.8 C) 96.4 F (35.8 C)  TempSrc:   Core (Comment)   Resp: 19 35 32 33  Height:      Weight:      SpO2: 97% 100% 100% 99%   Telemetry: sinus tachy. 4-5 beat runs of SVT vs VT.  Intake/Output from previous day:  Intake/Output Summary (Last 24 hours) at 12/24/2015 0956 Last data filed at 12/24/2015 0800  Gross per 24 hour  Intake 3383.26 ml  Output     35 ml  Net 3348.26 ml    Physical Exam: Physical exam: Well-developed well-nourished intubated and sedated Skin is warm and dry.  HEENT is normal.  Neck is supple.  Chest Coarse rhonchi Cardiovascular exam is regular rate and rhythm. No gallop or murmur. Abdominal exam distended and tense Extremities show 4+ edema/anasarca. neuro - intubated and sedated    Lab Results: Basic Metabolic Panel:  Recent Labs  96/12/5402/06/17 0604 12/22/2015 0420  NA 127* 124*  K 4.3 4.5  CL 97* 95*  CO2 19* 17*  GLUCOSE 118* 89  BUN 61* 69*  CREATININE 3.88* 4.32*  CALCIUM 8.0* 8.0*  MG 1.9 1.9  PHOS 6.1* 6.2*   CBC:  Recent Labs  12/05/15 0333 12/19/2015 0626  WBC 19.1* PENDING  HGB 8.6* 8.5*  HCT 27.0* 25.1*  MCV 103.4* 104.1*  PLT 77* PENDING     Assessment/Plan:  1 non-ST elevation myocardial infarction-istat troponin 1.41 on admission. No other troponins checked. Patient's troponin is elevated likely secondary to demand ischemia. He is not a candidate for aggressive cardiac evaluation due to multisystem organ failure. We cannot add aspirin or heparin due to  GI bleed. We cannot add beta blockade or other medications because of hypotension and shock.  2 VDRF-management per CCM 3 Acute renal failure- remains oliguric 4 Cirrhosis/acute liver failure 5 Alcohol  abuse 6 Volume overload-likely multifactorial including acute renal failure, cirrhosis with low albumin with possible cardiac contribution. Weight up 50 lbs. I/O positive another 2.7 liters. Marked anasarca.  Still oliguric with diuretics.  7 GI bleed-secondary to esophageal necrosis/ulceration. Follow hemoglobin. S/p EGD-esophageal ulcerations felt c/w ischemia or toxic ingestion. 8 Coagulopathy 9 Sepsis Patient is still requiring pressor support. On maximal vent support with persistent  acidosis.  Still hypotensive.  Prognosis looks very grim with multisystem failure. We have very little to add from a cardiac standpoint. Management per CCM. Will sign off now. Please call if there are specific cardiac issues we need to address.  Theron Aristaeter Sacred Heart HsptlJordanMD,FACC 12/26/2015, 9:56 AM

## 2015-12-31 NOTE — Progress Notes (Signed)
Pt asystolic on EKG leads X2, no apical heart tones, or spontaneous breath, pupils fixed and dilated. Pt DNR. Verified with Burnard BuntingKatrice Keller RN. Eduard RouxSarah Bullard NP at bedside. Pt pronounced at 1853. Family at bedside.  Horton ChinMacKayla A Anaih Brander RN

## 2015-12-31 DEATH — deceased

## 2017-05-17 IMAGING — CR DG CHEST 2V
2 series · 2 of 2 positions shown · non-contrast
Comparison: None.

CLINICAL DATA: Cough and congestion. Bilateral lower extremity
edema. Initial encounter.

EXAM:
CHEST  2 VIEW

[view not recorded (1 of 2)]
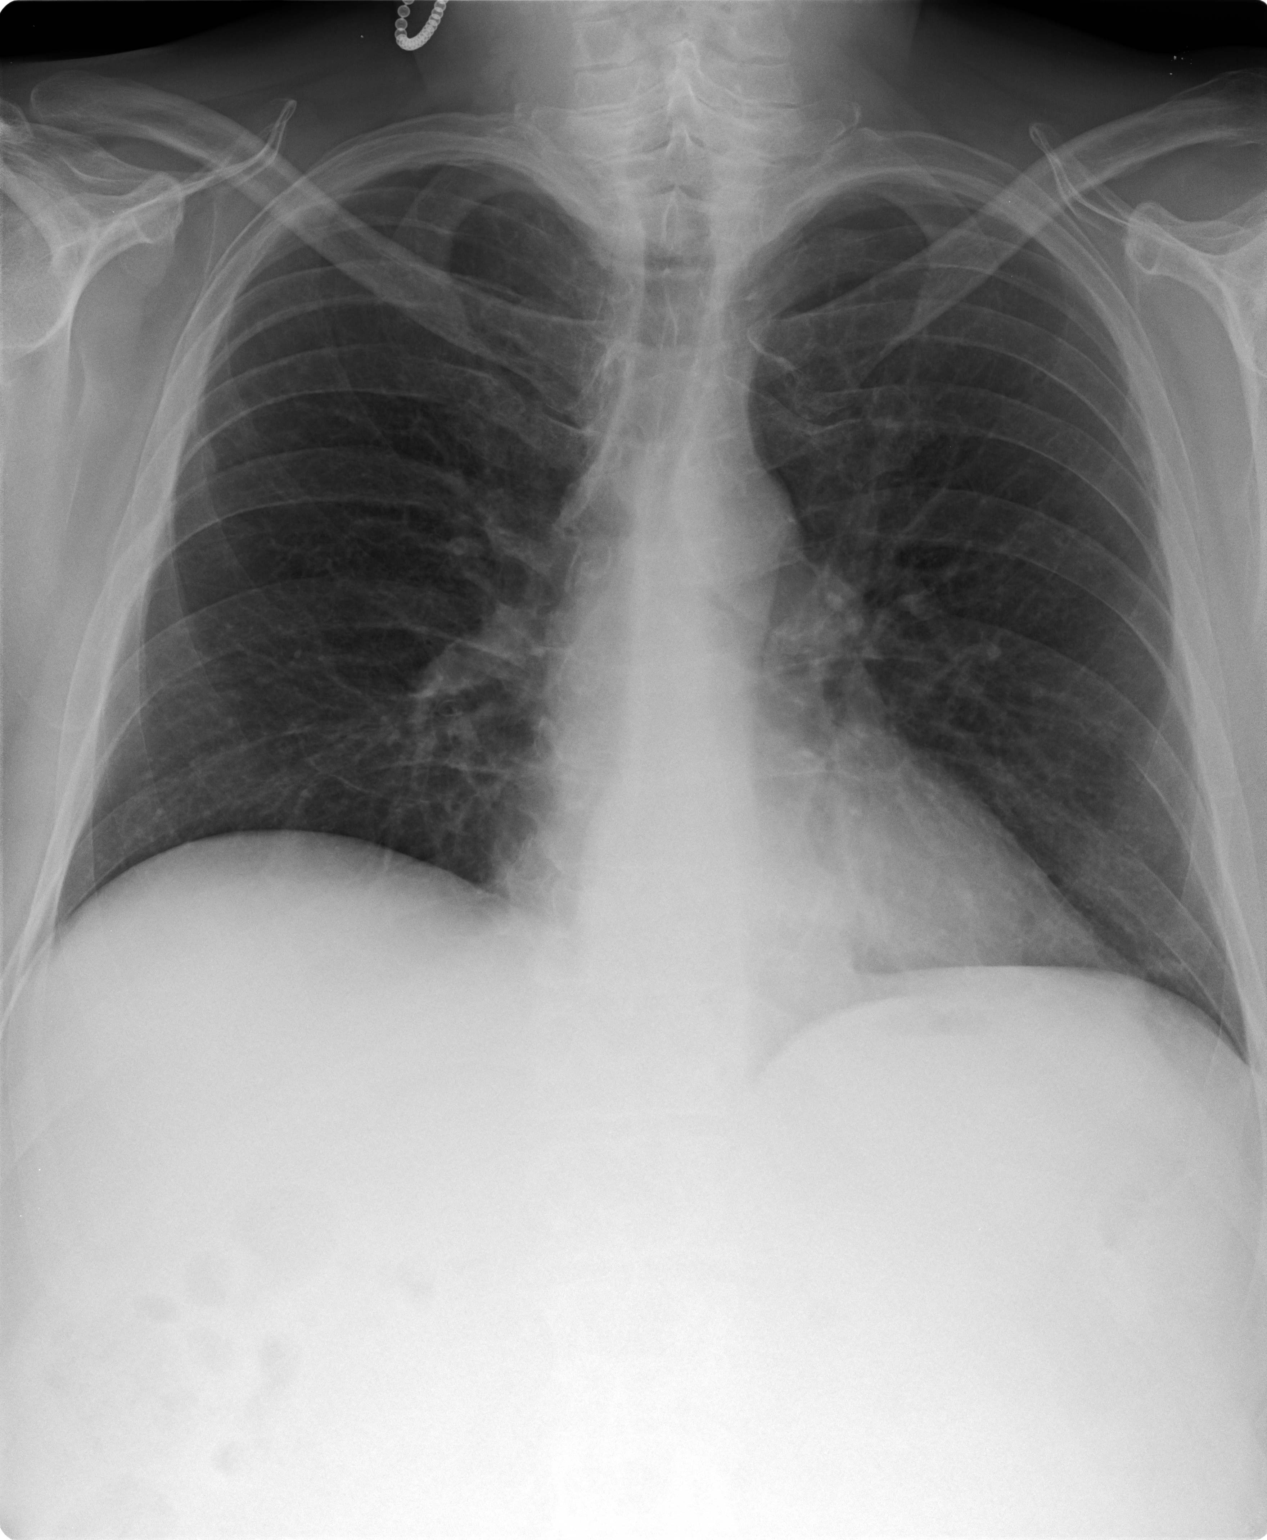

[view not recorded (2 of 2)]
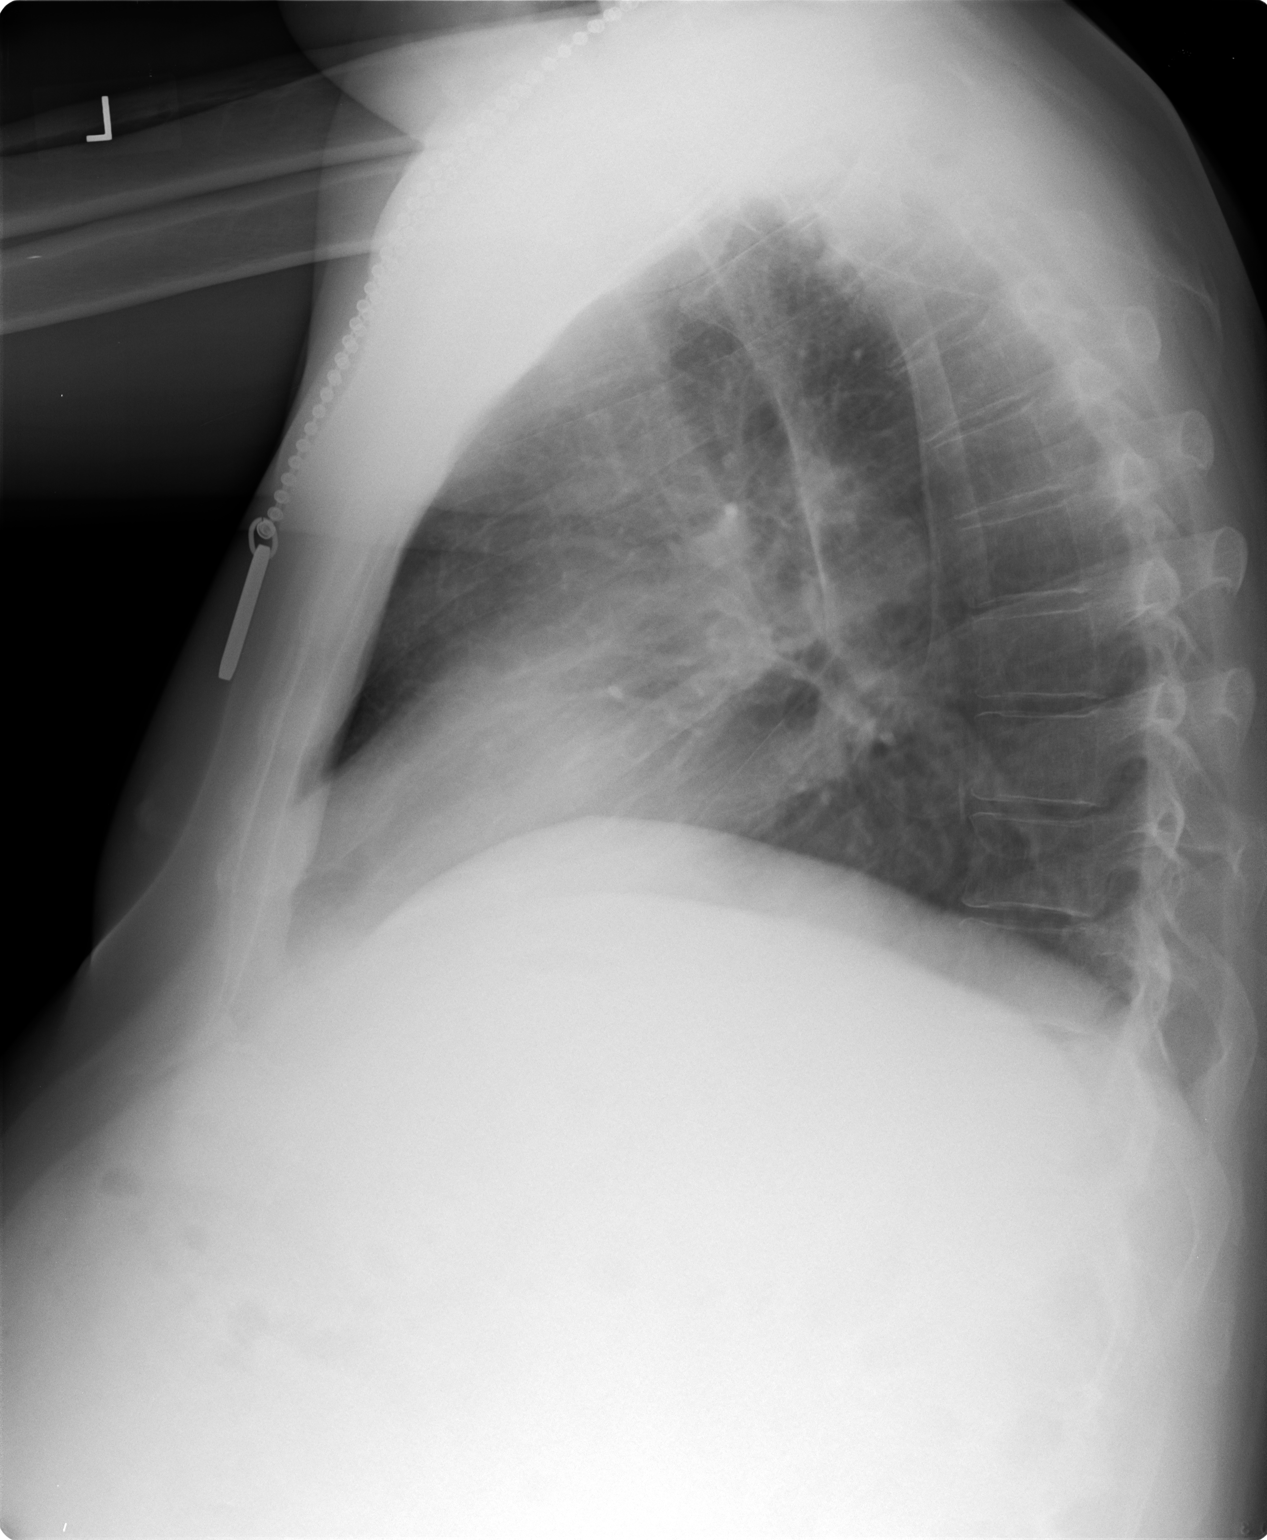

[2 of 2 positions shown; findings below may reference images not displayed]

FINDINGS: The lungs are clear. Heart size is normal. No pneumothorax or
pleural effusion. No focal bony abnormality.
IMPRESSION: Negative chest.

## 2017-05-18 IMAGING — CR DG CHEST 1V PORT
1 series · 1 of 1 positions shown · non-contrast
Comparison: 11/29/2015

CLINICAL DATA: SOB, tachycardia, altered mental status

EXAM:
PORTABLE CHEST 1 VIEW

[AP]
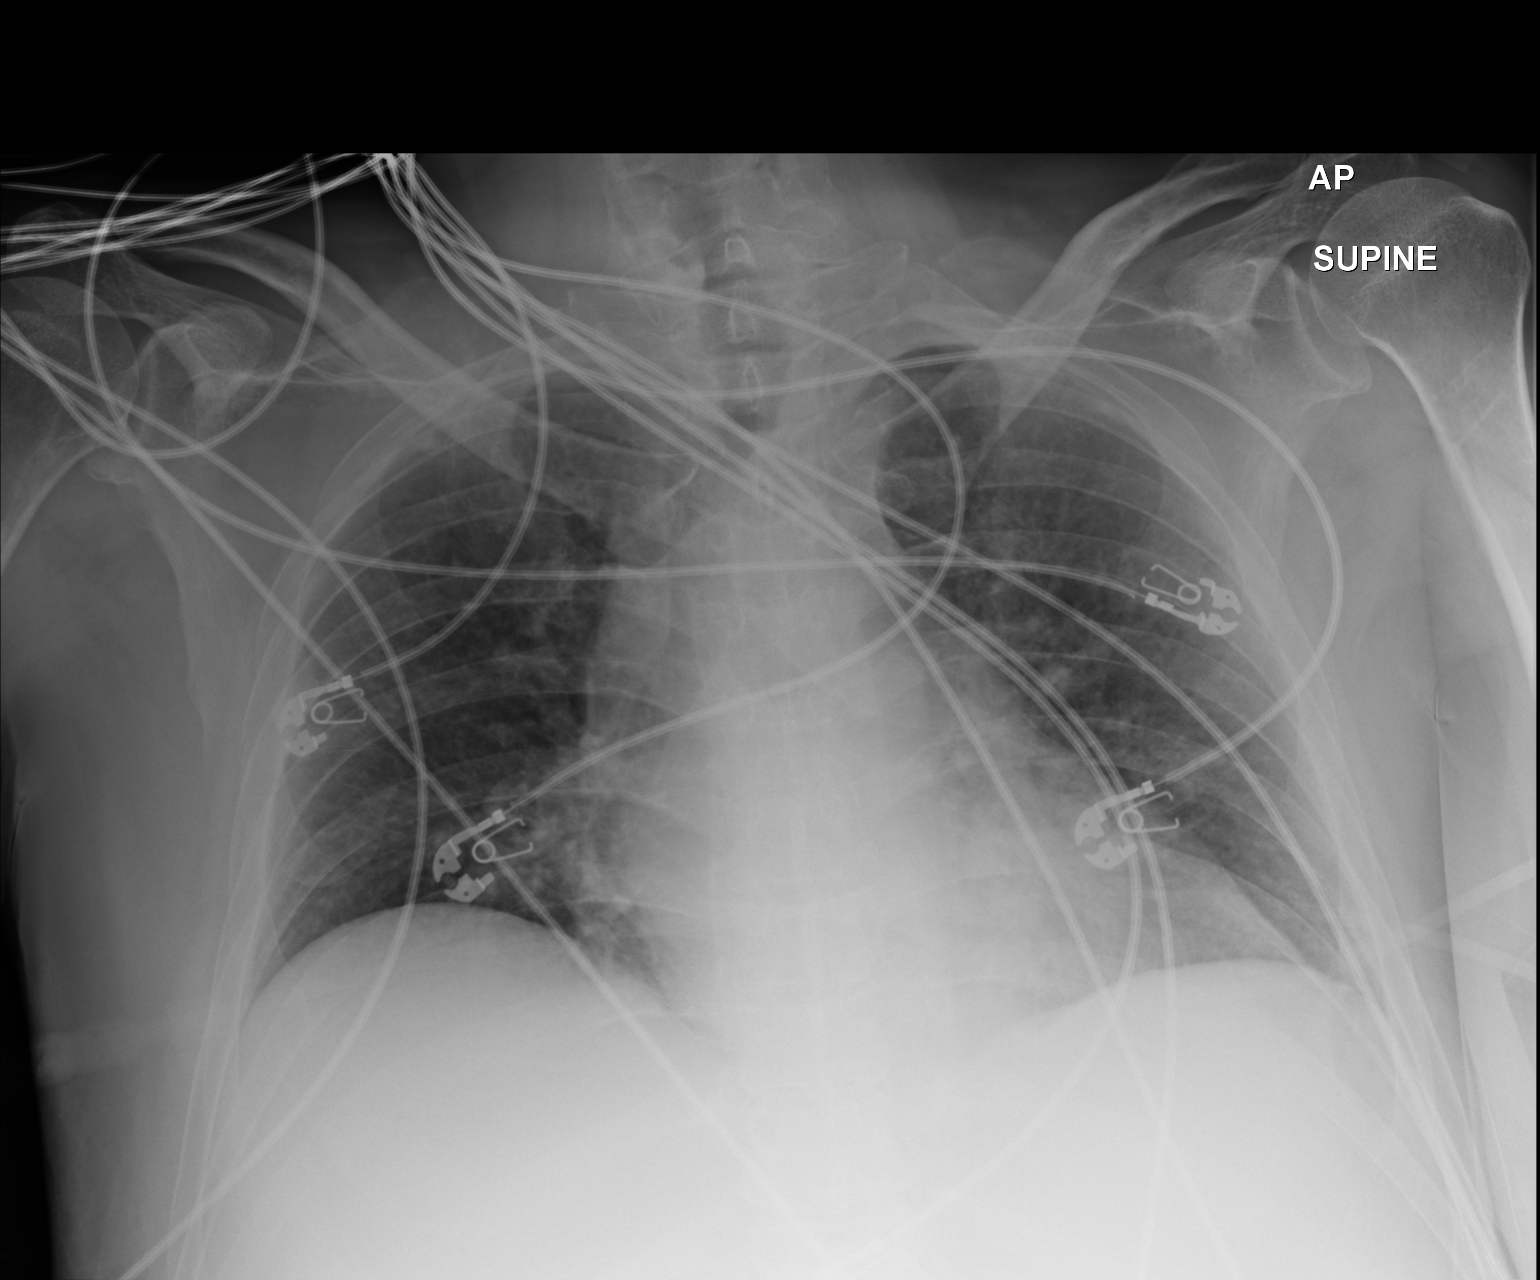

[1 of 1 positions shown; findings below may reference images not displayed]

FINDINGS: Midline trachea.  Normal heart size and mediastinal contours.

Sharp costophrenic angles.  No pneumothorax.  Clear lungs.

Mild right hemidiaphragm elevation. Numerous leads and wires project
over the chest.
IMPRESSION: No active disease.

## 2017-05-19 IMAGING — CR DG CHEST 1V PORT
1 series · 1 of 1 positions shown · non-contrast
Comparison: 11/30/2015

CLINICAL DATA: Central line placement. Pneumonia. Multiorgan system
failure.

EXAM:
PORTABLE CHEST 1 VIEW

[AP]
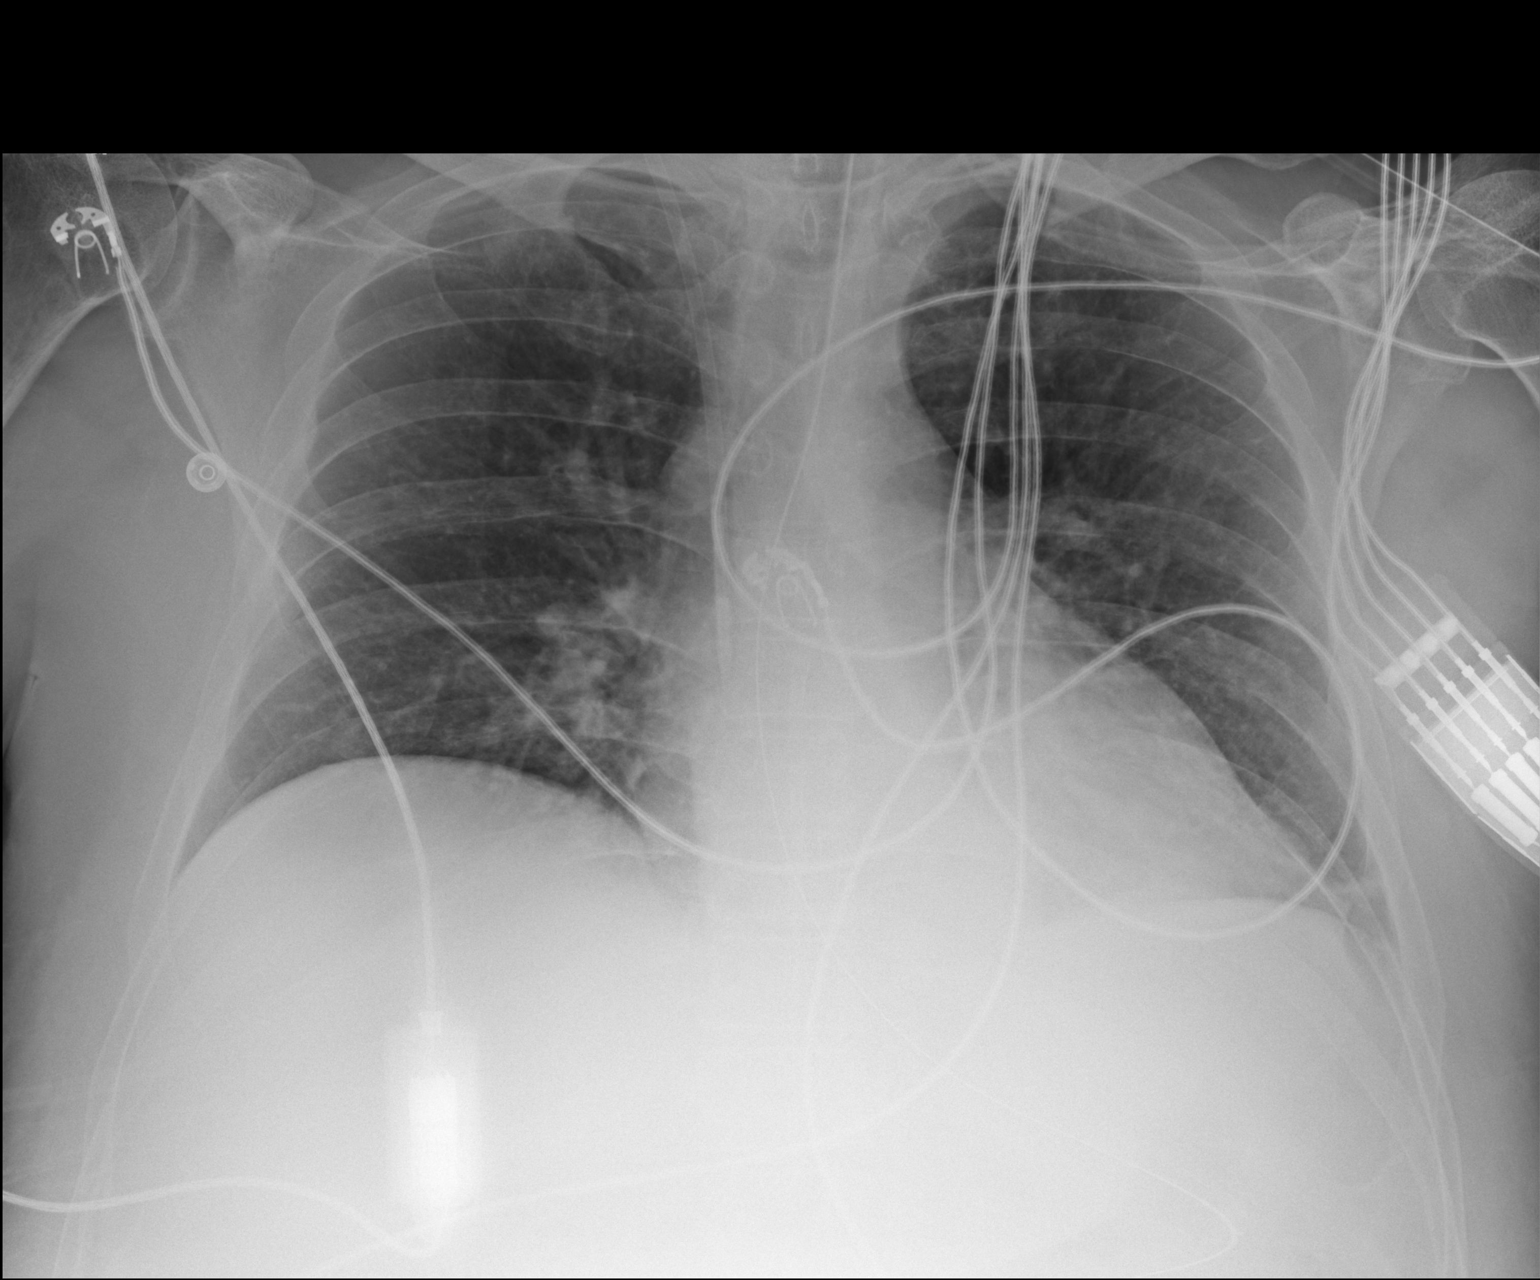

[1 of 1 positions shown; findings below may reference images not displayed]

FINDINGS: A new right jugular central venous catheter is seen with tip at the
superior cavoatrial junction. A nasogastric tube is also seen
entering the stomach. No pneumothorax identified.

Low lung volumes are seen. No evidence of pulmonary consolidation or
edema. No evidence of pleural effusion.
IMPRESSION: New right jugular central venous catheter and nasogastric tube in
appropriate position. No evidence of pneumothorax or other active
lung disease.

## 2017-05-20 IMAGING — CR DG CHEST 1V PORT
1 series · 1 of 1 positions shown · non-contrast
Comparison: 12/01/2015 and prior exams

CLINICAL DATA: Multi system organ failure and respiratory failure.

EXAM:
PORTABLE CHEST 1 VIEW

[AP]
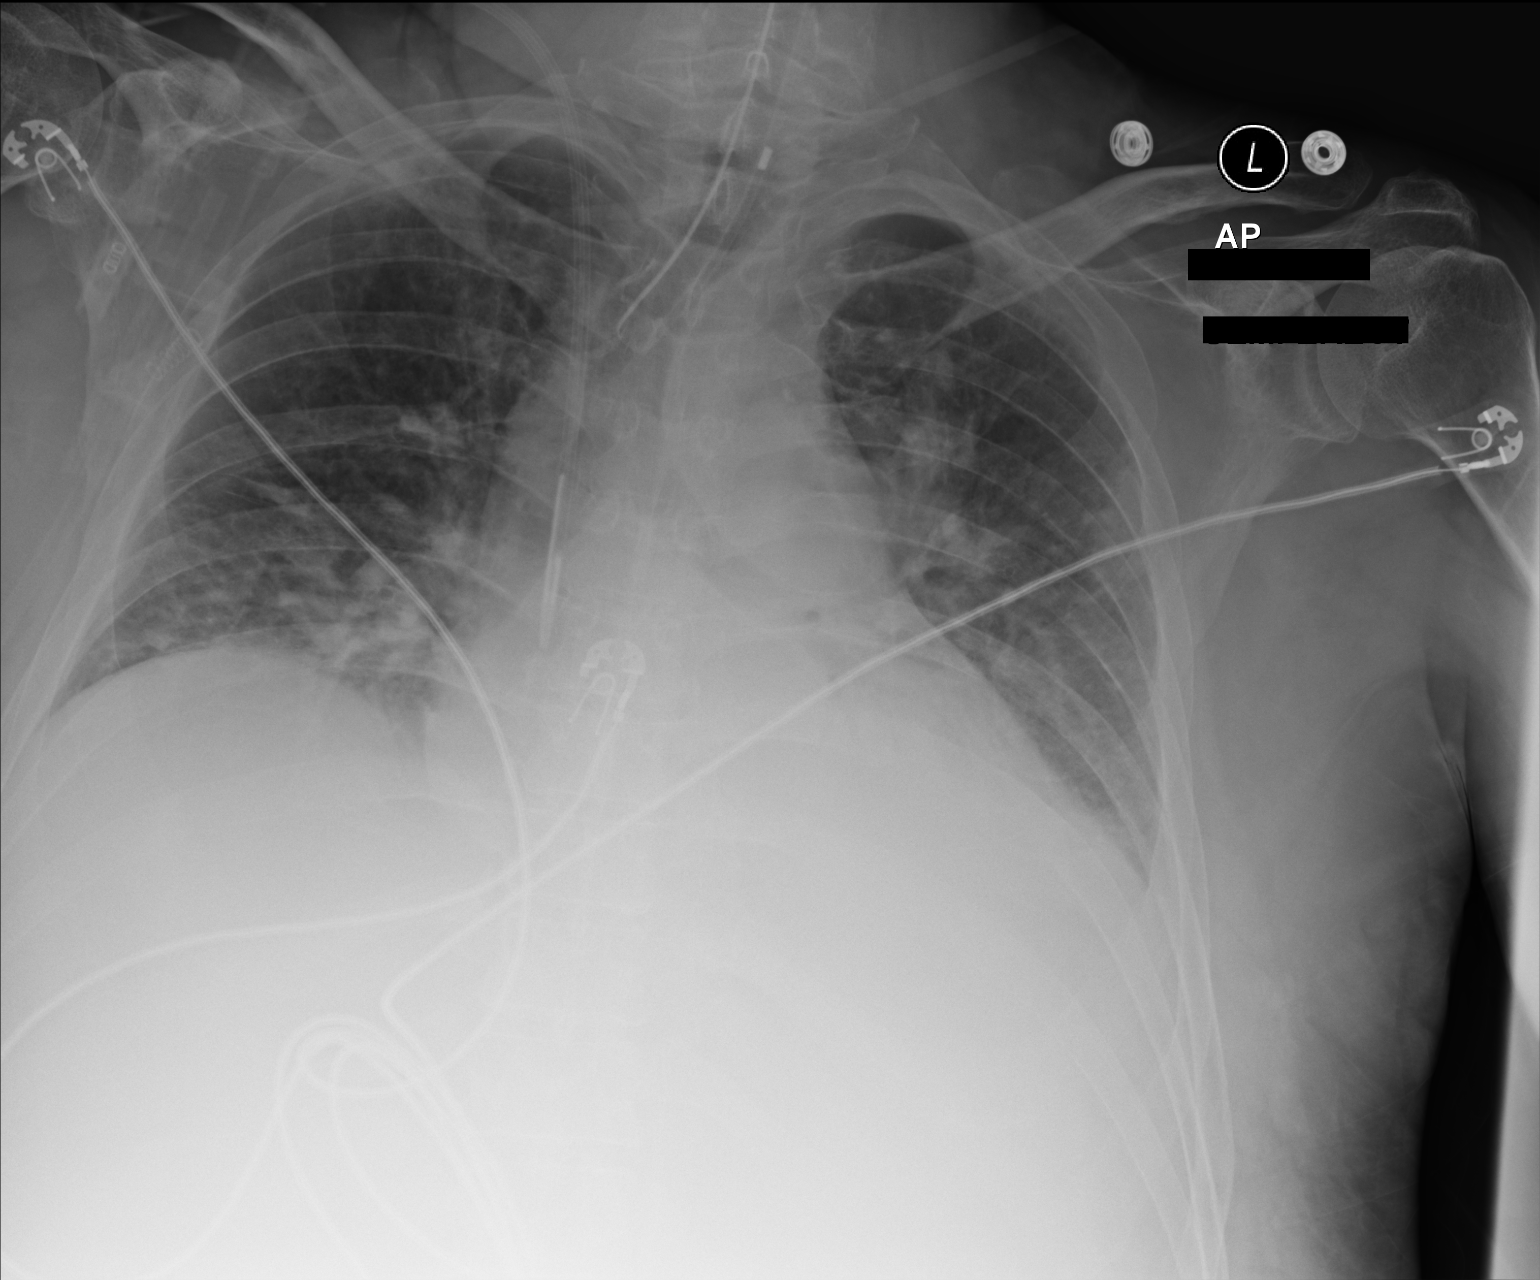

[1 of 1 positions shown; findings below may reference images not displayed]

FINDINGS: An endotracheal tube with tip 3.4 cm above the carina, right IJ
central venous catheter with tip overlying the superior cavoatrial
junction again noted. An NG tube has been removed.

Slightly decreased lung volumes with right basilar atelectasis and
left basilar atelectasis/ consolidation again noted.

Mild pulmonary vascular congestion again identified.

There is no evidence of pneumothorax.
IMPRESSION: Slightly decreased lung volumes and NG tube removal, otherwise
unchanged appearance of the chest.
